# Patient Record
Sex: Female | Born: 1970 | Race: Black or African American | Hispanic: No | Marital: Married | Smoking: Never smoker
Health system: Southern US, Community
[De-identification: ages and names within clinical notes are randomized; demographics above are authoritative.]

## PROBLEM LIST (undated history)

## (undated) DIAGNOSIS — K219 Gastro-esophageal reflux disease without esophagitis: Secondary | ICD-10-CM

## (undated) DIAGNOSIS — K635 Polyp of colon: Secondary | ICD-10-CM

## (undated) DIAGNOSIS — E785 Hyperlipidemia, unspecified: Secondary | ICD-10-CM

## (undated) DIAGNOSIS — E7800 Pure hypercholesterolemia, unspecified: Secondary | ICD-10-CM

## (undated) DIAGNOSIS — F419 Anxiety disorder, unspecified: Secondary | ICD-10-CM

## (undated) DIAGNOSIS — G43909 Migraine, unspecified, not intractable, without status migrainosus: Secondary | ICD-10-CM

## (undated) DIAGNOSIS — I10 Essential (primary) hypertension: Secondary | ICD-10-CM

## (undated) HISTORY — DX: Essential (primary) hypertension: I10

## (undated) HISTORY — DX: Migraine, unspecified, not intractable, without status migrainosus: G43.909

## (undated) HISTORY — DX: Hyperlipidemia, unspecified: E78.5

## (undated) HISTORY — DX: Gastro-esophageal reflux disease without esophagitis: K21.9

## (undated) HISTORY — PX: ABDOMINAL HYSTERECTOMY: SHX81

## (undated) HISTORY — DX: Anxiety disorder, unspecified: F41.9

## (undated) HISTORY — DX: Pure hypercholesterolemia, unspecified: E78.00

## (undated) HISTORY — DX: Polyp of colon: K63.5

---

## 1997-09-23 HISTORY — PX: KNEE SURGERY: SHX244

## 2006-08-01 ENCOUNTER — Ambulatory Visit: Payer: Self-pay | Admitting: Internal Medicine

## 2007-02-03 ENCOUNTER — Ambulatory Visit (HOSPITAL_COMMUNITY): Admission: RE | Admit: 2007-02-03 | Discharge: 2007-02-03 | Payer: Self-pay | Admitting: Specialist

## 2008-03-15 ENCOUNTER — Ambulatory Visit: Payer: Self-pay | Admitting: Gastroenterology

## 2008-08-16 ENCOUNTER — Ambulatory Visit: Payer: Self-pay | Admitting: Internal Medicine

## 2008-09-23 DIAGNOSIS — K635 Polyp of colon: Secondary | ICD-10-CM

## 2008-09-23 HISTORY — PX: COLONOSCOPY: SHX174

## 2008-09-23 HISTORY — DX: Polyp of colon: K63.5

## 2008-10-17 ENCOUNTER — Emergency Department: Payer: Self-pay | Admitting: Emergency Medicine

## 2009-06-20 ENCOUNTER — Emergency Department: Payer: Self-pay | Admitting: Emergency Medicine

## 2009-11-29 ENCOUNTER — Emergency Department: Payer: Self-pay | Admitting: Emergency Medicine

## 2011-01-29 ENCOUNTER — Ambulatory Visit: Payer: Self-pay | Admitting: Internal Medicine

## 2011-06-28 ENCOUNTER — Emergency Department: Payer: Self-pay | Admitting: Internal Medicine

## 2011-08-25 ENCOUNTER — Ambulatory Visit: Payer: Self-pay | Admitting: Sports Medicine

## 2011-12-09 ENCOUNTER — Ambulatory Visit: Payer: Self-pay

## 2012-01-16 ENCOUNTER — Ambulatory Visit: Payer: Self-pay

## 2012-05-29 ENCOUNTER — Ambulatory Visit: Payer: Self-pay | Admitting: Internal Medicine

## 2012-05-29 LAB — URINALYSIS, COMPLETE
Bilirubin,UR: NEGATIVE
Blood: NEGATIVE
Glucose,UR: NEGATIVE mg/dL (ref 0–75)
Ketone: NEGATIVE
Leukocyte Esterase: NEGATIVE
Nitrite: NEGATIVE
Ph: 6 (ref 4.5–8.0)
Protein: NEGATIVE
Specific Gravity: 1.02 (ref 1.003–1.030)

## 2012-05-29 LAB — PREGNANCY, URINE: Pregnancy Test, Urine: NEGATIVE m[IU]/mL

## 2012-05-30 LAB — URINE CULTURE

## 2013-02-02 ENCOUNTER — Ambulatory Visit: Payer: Self-pay

## 2013-12-16 ENCOUNTER — Emergency Department: Payer: Self-pay | Admitting: Emergency Medicine

## 2013-12-16 LAB — COMPREHENSIVE METABOLIC PANEL
Albumin: 3.4 g/dL (ref 3.4–5.0)
Alkaline Phosphatase: 99 U/L
Anion Gap: 5 — ABNORMAL LOW (ref 7–16)
BUN: 11 mg/dL (ref 7–18)
Bilirubin,Total: 0.2 mg/dL (ref 0.2–1.0)
Calcium, Total: 8.5 mg/dL (ref 8.5–10.1)
Chloride: 99 mmol/L (ref 98–107)
Co2: 31 mmol/L (ref 21–32)
Creatinine: 0.76 mg/dL (ref 0.60–1.30)
EGFR (African American): >60
EGFR (Non-African Amer.): >60
Glucose: 99 mg/dL (ref 65–99)
Osmolality: 270 (ref 275–301)
Potassium: 2.9 mmol/L — ABNORMAL LOW (ref 3.5–5.1)
SGOT(AST): 28 U/L (ref 15–37)
SGPT (ALT): 29 U/L (ref 12–78)
Sodium: 135 mmol/L — ABNORMAL LOW (ref 136–145)
Total Protein: 7.4 g/dL (ref 6.4–8.2)

## 2013-12-16 LAB — CBC WITH DIFFERENTIAL/PLATELET
Basophil #: 0.1 10*3/uL (ref 0.0–0.1)
Basophil %: 0.6 %
Eosinophil #: 0 10*3/uL (ref 0.0–0.7)
Eosinophil %: 0.3 %
HCT: 34.6 % — ABNORMAL LOW (ref 35.0–47.0)
HGB: 11.6 g/dL — ABNORMAL LOW (ref 12.0–16.0)
Lymphocyte #: 1.3 10*3/uL (ref 1.0–3.6)
Lymphocyte %: 11.9 %
MCH: 28.5 pg (ref 26.0–34.0)
MCHC: 33.6 g/dL (ref 32.0–36.0)
MCV: 85 fL (ref 80–100)
Monocyte #: 0.5 x10 3/mm (ref 0.2–0.9)
Monocyte %: 4 %
Neutrophil #: 9.4 10*3/uL — ABNORMAL HIGH (ref 1.4–6.5)
Neutrophil %: 83.2 %
Platelet: 344 10*3/uL (ref 150–440)
RBC: 4.07 10*6/uL (ref 3.80–5.20)
RDW: 14.2 % (ref 11.5–14.5)
WBC: 11.3 10*3/uL — ABNORMAL HIGH (ref 3.6–11.0)

## 2013-12-16 LAB — LIPASE, BLOOD: Lipase: 98 U/L (ref 73–393)

## 2013-12-16 LAB — URINALYSIS, COMPLETE
Bacteria: NONE SEEN
Bilirubin,UR: NEGATIVE
Glucose,UR: NEGATIVE mg/dL (ref 0–75)
Ketone: NEGATIVE
Nitrite: NEGATIVE
Ph: 6 (ref 4.5–8.0)
Protein: NEGATIVE
RBC,UR: 2 /HPF (ref 0–5)
Specific Gravity: 1.017 (ref 1.003–1.030)
Squamous Epithelial: 1
WBC UR: <1 /HPF (ref 0–5)

## 2013-12-16 LAB — TROPONIN I: Troponin-I: <0.02 ng/mL

## 2013-12-29 ENCOUNTER — Ambulatory Visit: Payer: Self-pay | Admitting: Internal Medicine

## 2013-12-29 LAB — CREATININE, SERUM
Creatinine: 0.81 mg/dL (ref 0.60–1.30)
EGFR (African American): >60
EGFR (Non-African Amer.): >60

## 2014-01-05 ENCOUNTER — Ambulatory Visit (INDEPENDENT_AMBULATORY_CARE_PROVIDER_SITE_OTHER): Payer: BC Managed Care – PPO | Admitting: General Surgery

## 2014-01-05 ENCOUNTER — Encounter: Payer: Self-pay | Admitting: General Surgery

## 2014-01-05 VITALS — BP 100/78 | HR 80 | Resp 12 | Ht <= 58 in | Wt 153.0 lb

## 2014-01-05 DIAGNOSIS — K824 Cholesterolosis of gallbladder: Secondary | ICD-10-CM

## 2014-01-05 NOTE — Progress Notes (Signed)
Patient ID: Joanne Maddox, female   DOB: [birth year], 43 y.o.   MRN: [id number]  Chief Complaint  Patient presents with  . Other    gall bladder polyp    HPI Jason Hauge is a 43 y.o. female.  Here today for evaluation of gall bladder polyp.  States she has had abdominal pain and diarrhea for about 2 weeks in [facility].  She states her bowels are normal now and move daily. The abdominal pain was upper mid abdomen that lasted all day, started out sharpe then went to a dull ache.  The pain occurred about 2-3 times a week. She feels like she is back to her "normal". She does admit to having a history of being constipated in the past.   Ultrasound done at Dr Humphrey Rolls office on 12-23-13.  HPI  Past Medical History  Diagnosis Date  . Hypertension   . Hyperlipidemia   . Colon polyp 2010  . GERD (gastroesophageal reflux disease)     Past Surgical History  Procedure Laterality Date  . Cesarean section  1989  . Knee surgery Right 1999  . Colonoscopy  2010    Dr Vira Agar    History reviewed. No pertinent family history.  Social History History  Substance Use Topics  . Smoking status: Never Smoker   . Smokeless tobacco: Never Used  . Alcohol Use: Yes     Comment: occasionally    Allergies  Allergen Reactions  . Bactrim [Sulfamethoxazole-Tmp Ds] Hives    Current Outpatient Prescriptions  Medication Sig Dispense Refill  . ALPRAZolam (XANAX) 0.25 MG tablet Take 0.25 mg by mouth at bedtime as needed.       Marland Kitchen losartan-hydrochlorothiazide (HYZAAR) 100-12.5 MG per tablet Take 1 tablet by mouth daily.       Marland Kitchen lovastatin (MEVACOR) 40 MG tablet Take 40 mg by mouth at bedtime.       Marland Kitchen omeprazole (PRILOSEC) 40 MG capsule Take 40 mg by mouth daily.       . SUMAtriptan (IMITREX) 50 MG tablet Take 50 mg by mouth every 2 (two) hours as needed.       . traMADol (ULTRAM) 50 MG tablet Take 50 mg by mouth every 6 (six) hours as needed.        No current facility-administered medications for this visit.     Review of Systems Review of Systems  Constitutional: Negative.   Respiratory: Negative.   Cardiovascular: Negative.   Gastrointestinal: Positive for abdominal pain and diarrhea.    Blood pressure 100/78, pulse 80, resp. rate 12, height 4\' 9"  (1.448 m), weight 153 lb (69.4 kg), last menstrual period 12/12/2013.  Physical Exam Physical Exam  Constitutional: She is oriented to person, place, and time. She appears well-developed and well-nourished.  Eyes: Conjunctivae are normal. No scleral icterus.  Neck: Neck supple.  Cardiovascular: Normal rate, regular rhythm and normal heart sounds.   Pulmonary/Chest: Effort normal and breath sounds normal.  Abdominal: Soft. Normal appearance and bowel sounds are normal. There is no tenderness.  Lymphadenopathy:    She has no cervical adenopathy.  Neurological: She is alert and oriented to person, place, and time.  Skin: Skin is warm and dry.    Data Reviewed US abdomen.  Assessment    Gallbladder polyp. It is very small and likely not cause of her recent symptoms which have now resolved,.    Plan    F/U prn. Gallbladder polyp can be followed with serial yearly [place]. Discussed fully with pt.  Seeplaputhur G Sankar 01/07/2014, 10:16 AM

## 2014-01-05 NOTE — Patient Instructions (Signed)
The patient is aware to call back for any questions or concerns.  

## 2014-01-07 ENCOUNTER — Encounter: Payer: Self-pay | Admitting: General Surgery

## 2014-01-07 DIAGNOSIS — K824 Cholesterolosis of gallbladder: Secondary | ICD-10-CM | POA: Insufficient documentation

## 2014-02-08 ENCOUNTER — Ambulatory Visit: Payer: Self-pay | Admitting: Family Medicine

## 2014-02-08 LAB — URINALYSIS, COMPLETE
Bacteria: NEGATIVE
Bilirubin,UR: NEGATIVE
Blood: NEGATIVE
Glucose,UR: NEGATIVE mg/dL (ref 0–75)
Ketone: NEGATIVE
Leukocyte Esterase: NEGATIVE
Nitrite: NEGATIVE
Ph: 6 (ref 4.5–8.0)
Protein: NEGATIVE
RBC,UR: NONE SEEN /HPF (ref 0–5)
Specific Gravity: 1.01 (ref 1.003–1.030)

## 2014-02-08 LAB — WET PREP, GENITAL

## 2014-02-08 LAB — PREGNANCY, URINE: Pregnancy Test, Urine: NEGATIVE m[IU]/mL

## 2014-02-10 LAB — URINE CULTURE

## 2014-07-25 ENCOUNTER — Encounter: Payer: Self-pay | Admitting: General Surgery

## 2014-08-16 ENCOUNTER — Ambulatory Visit: Payer: Self-pay | Admitting: Internal Medicine

## 2015-07-31 LAB — HM PAP SMEAR: HM Pap smear: NEGATIVE

## 2015-09-11 ENCOUNTER — Other Ambulatory Visit: Payer: Self-pay | Admitting: Nurse Practitioner

## 2015-09-11 DIAGNOSIS — Z1231 Encounter for screening mammogram for malignant neoplasm of breast: Secondary | ICD-10-CM

## 2015-09-20 ENCOUNTER — Ambulatory Visit
Admission: RE | Admit: 2015-09-20 | Discharge: 2015-09-20 | Disposition: A | Discharge status: Home / Self Care | Payer: BLUE CROSS/BLUE SHIELD | Source: Ambulatory Visit | Attending: Nurse Practitioner | Admitting: Nurse Practitioner

## 2015-09-20 DIAGNOSIS — Z1231 Encounter for screening mammogram for malignant neoplasm of breast: Secondary | ICD-10-CM | POA: Diagnosis present

## 2015-11-15 ENCOUNTER — Other Ambulatory Visit (HOSPITAL_COMMUNITY): Payer: Self-pay | Admitting: Nurse Practitioner

## 2015-11-15 ENCOUNTER — Other Ambulatory Visit: Payer: Self-pay | Admitting: Nurse Practitioner

## 2015-11-15 DIAGNOSIS — R079 Chest pain, unspecified: Secondary | ICD-10-CM

## 2015-11-22 ENCOUNTER — Ambulatory Visit: Payer: BLUE CROSS/BLUE SHIELD

## 2016-03-22 DIAGNOSIS — F411 Generalized anxiety disorder: Secondary | ICD-10-CM | POA: Diagnosis not present

## 2016-03-22 DIAGNOSIS — K5909 Other constipation: Secondary | ICD-10-CM | POA: Diagnosis not present

## 2016-03-22 DIAGNOSIS — I10 Essential (primary) hypertension: Secondary | ICD-10-CM | POA: Diagnosis not present

## 2016-03-22 DIAGNOSIS — G43109 Migraine with aura, not intractable, without status migrainosus: Secondary | ICD-10-CM | POA: Diagnosis not present

## 2016-06-27 DIAGNOSIS — Z883 Allergy status to other anti-infective agents status: Secondary | ICD-10-CM | POA: Diagnosis not present

## 2016-06-27 DIAGNOSIS — I10 Essential (primary) hypertension: Secondary | ICD-10-CM | POA: Diagnosis not present

## 2016-06-27 DIAGNOSIS — M549 Dorsalgia, unspecified: Secondary | ICD-10-CM | POA: Diagnosis not present

## 2016-06-27 DIAGNOSIS — R0789 Other chest pain: Secondary | ICD-10-CM | POA: Diagnosis not present

## 2016-06-27 DIAGNOSIS — R0781 Pleurodynia: Secondary | ICD-10-CM | POA: Diagnosis not present

## 2016-07-22 DIAGNOSIS — K5909 Other constipation: Secondary | ICD-10-CM | POA: Diagnosis not present

## 2016-07-22 DIAGNOSIS — I10 Essential (primary) hypertension: Secondary | ICD-10-CM | POA: Diagnosis not present

## 2016-07-22 DIAGNOSIS — M545 Low back pain: Secondary | ICD-10-CM | POA: Diagnosis not present

## 2016-07-22 DIAGNOSIS — M549 Dorsalgia, unspecified: Secondary | ICD-10-CM | POA: Diagnosis not present

## 2016-11-06 DIAGNOSIS — G43109 Migraine with aura, not intractable, without status migrainosus: Secondary | ICD-10-CM | POA: Diagnosis not present

## 2016-11-06 DIAGNOSIS — N921 Excessive and frequent menstruation with irregular cycle: Secondary | ICD-10-CM | POA: Diagnosis not present

## 2016-11-06 DIAGNOSIS — Z124 Encounter for screening for malignant neoplasm of cervix: Secondary | ICD-10-CM | POA: Diagnosis not present

## 2016-11-06 DIAGNOSIS — I10 Essential (primary) hypertension: Secondary | ICD-10-CM | POA: Diagnosis not present

## 2016-11-06 DIAGNOSIS — Z0001 Encounter for general adult medical examination with abnormal findings: Secondary | ICD-10-CM | POA: Diagnosis not present

## 2016-11-08 DIAGNOSIS — E782 Mixed hyperlipidemia: Secondary | ICD-10-CM | POA: Diagnosis not present

## 2016-11-08 DIAGNOSIS — E559 Vitamin D deficiency, unspecified: Secondary | ICD-10-CM | POA: Diagnosis not present

## 2016-11-08 DIAGNOSIS — Z0001 Encounter for general adult medical examination with abnormal findings: Secondary | ICD-10-CM | POA: Diagnosis not present

## 2016-11-08 DIAGNOSIS — I10 Essential (primary) hypertension: Secondary | ICD-10-CM | POA: Diagnosis not present

## 2016-11-08 DIAGNOSIS — N921 Excessive and frequent menstruation with irregular cycle: Secondary | ICD-10-CM | POA: Diagnosis not present

## 2016-11-13 DIAGNOSIS — N921 Excessive and frequent menstruation with irregular cycle: Secondary | ICD-10-CM | POA: Diagnosis not present

## 2016-12-27 DIAGNOSIS — D259 Leiomyoma of uterus, unspecified: Secondary | ICD-10-CM | POA: Diagnosis not present

## 2016-12-27 DIAGNOSIS — G43109 Migraine with aura, not intractable, without status migrainosus: Secondary | ICD-10-CM | POA: Diagnosis not present

## 2016-12-27 DIAGNOSIS — N921 Excessive and frequent menstruation with irregular cycle: Secondary | ICD-10-CM | POA: Diagnosis not present

## 2016-12-27 DIAGNOSIS — E782 Mixed hyperlipidemia: Secondary | ICD-10-CM | POA: Diagnosis not present

## 2016-12-31 ENCOUNTER — Telehealth: Payer: Self-pay | Admitting: Obstetrics and Gynecology

## 2016-12-31 NOTE — Telephone Encounter (Signed)
LV M for Pt to call back to be schedule for Referral From [facility]  Dr. Clayborn Bigness. For large 8 cm Fibroid Tummor with enlargement uterus. Please schedule when pt calls back.

## 2016-12-31 NOTE — Telephone Encounter (Signed)
This was in Clinical basket, we can't schedule patients

## 2017-01-01 NOTE — Telephone Encounter (Signed)
Left voicemail to have pt to call back to be schedule 2x

## 2017-01-02 NOTE — Telephone Encounter (Signed)
LVm to have pt call back to schedule appt 3x. Will Send out letter

## 2017-01-02 NOTE — Telephone Encounter (Signed)
Pt is schedule 01/23/17 for AMS

## 2017-01-23 ENCOUNTER — Ambulatory Visit (INDEPENDENT_AMBULATORY_CARE_PROVIDER_SITE_OTHER): Payer: Self-pay | Admitting: Obstetrics and Gynecology

## 2017-01-23 ENCOUNTER — Encounter: Payer: Self-pay | Admitting: Obstetrics and Gynecology

## 2017-01-23 VITALS — BP 112/72 | HR 64 | Ht <= 58 in | Wt 154.0 lb

## 2017-01-23 DIAGNOSIS — D259 Leiomyoma of uterus, unspecified: Secondary | ICD-10-CM | POA: Diagnosis not present

## 2017-01-23 DIAGNOSIS — N920 Excessive and frequent menstruation with regular cycle: Secondary | ICD-10-CM

## 2017-01-23 NOTE — Progress Notes (Signed)
Gynecology Abnormal Uterine Bleeding Initial Evaluation   Chief Complaint:  Chief Complaint  Patient presents with  . Fibroids    Referred by PCP Dr. Chancy Milroy    History of Present Illness:    Paitient is a 46 y.o. G1P1 who LMP was Patient's last menstrual period was 12/30/2016 (approximate)., presents today for a problem visit.  She has been experiencing increasingly heavy but regular menstrual flow over the past year.  Evaluation by her PCP revealed an 8cm uterine fibroid, records of imaging are not available for review at the time of the consultation.  The only prior uterine imaging I have available for review is [facility] conducted 02/03/2008 which showed patent tube and at that time normal uterine contour.  Currently menses last up to 7 days, she does report some dysmenorrhea and passage of clots.  She herself does not relay a prior history of fibroids preceding this utlrasound  Previous evaluation: ultrasound showing reported 8cm uterine fibroid. Prior Diagnosis: uterine fibroids. Previous Treatment: none.    Review of Systems: Review of Systems  Cardiovascular: Negative for chest pain.  Gastrointestinal: Positive for constipation. Negative for abdominal pain and diarrhea.  Genitourinary: Positive for frequency. Negative for dysuria, flank pain, hematuria and urgency.    Past Medical History:  Past Medical History:  Diagnosis Date  . Anxiety   . Colon polyp 2010  . GERD (gastroesophageal reflux disease)   . Hypercholesteremia   . Hyperlipidemia   . Hypertension   . Migraine     Past Surgical History:  Past Surgical History:  Procedure Laterality Date  . CESAREAN SECTION  1989  . COLONOSCOPY  2010   Dr Vira Agar  . KNEE SURGERY Right 1999    Obstetric History: G1P1  Family History:  History reviewed. No pertinent family history.  Social History:  Social History   Social History  . Marital status: Married    Spouse name: N/A  . Number of children: N/A  . Years of  education: N/A   Occupational History  . Not on file.   Social History Main Topics  . Smoking status: Never Smoker  . Smokeless tobacco: Never Used  . Alcohol use Yes     Comment: occasionally  . Drug use: No  . Sexual activity: Yes   Other Topics Concern  . Not on file   Social History Narrative  . No narrative on file    Allergies:  Allergies  Allergen Reactions  . Bactrim [Sulfamethoxazole-Trimethoprim] Hives    Medications: Prior to Admission medications   Medication Sig Start Date End Date Taking? Authorizing Provider  ALPRAZolam (XANAX) 0.25 MG tablet Take 0.25 mg by mouth at bedtime as needed.  11/04/13   Historical Provider, MD  losartan-hydrochlorothiazide (HYZAAR) 100-12.5 MG per tablet Take 1 tablet by mouth daily.  12/07/13   Historical Provider, MD  lovastatin (MEVACOR) 40 MG tablet Take 40 mg by mouth at bedtime.  11/03/13   Historical Provider, MD  omeprazole (PRILOSEC) 40 MG capsule Take 40 mg by mouth daily.  12/09/13   Historical Provider, MD  SUMAtriptan (IMITREX) 50 MG tablet Take 50 mg by mouth every 2 (two) hours as needed.  12/15/13   Historical Provider, MD  traMADol (ULTRAM) 50 MG tablet Take 50 mg by mouth every 6 (six) hours as needed.  12/11/13   Historical Provider, MD    Physical Exam Vitals:  Vitals:   01/23/17 1011  BP: 112/72  Pulse: 64   Patient's last menstrual period was  12/30/2016 (approximate).  General: NAD HEENT: normocephalic, anicteric Pulmonary: No increased work of breathing Abdomen: NABS, soft, non-tender, non-distended.  Umbilicus without lesions.  No hepatomegaly, splenomegaly enlarged uterus or other mass extending to approximately just below the umbilicus and more prominent on patient left. No evidence of hernia  Extremities: no edema, erythema, or tenderness Neurologic: Grossly intact Psychiatric: mood appropriate, affect full  Female chaperone present for pelvic portions of the physical exam  Assessment: 46 y.o. G1P1  with menorrhagia likely secondary to uterine fibroids  Plan: Problem List Items Addressed This Visit    None    Visit Diagnoses    Uterine leiomyoma, unspecified location    -  Primary   Relevant Orders   US Transvaginal Non-OB   Menorrhagia with regular cycle           1) Discussed management options for uterine fibroids including expectant management, attempts at hormonal management (progestins including IUD, depo leupron), uterine artery embolization, high frequency ultrasound ablation, and hysterectomy.  Discussed risks and benefits of each method.   Final management decision will hinge on results of patient's work up and follow up ultrasound to document stable size, location of fibroid   Printed patient education handouts were given to the patient to review at home.  Bleeding precautions reviewed.   2) The patient presents some surgical risk and will likely need cardiac clearance prior to proceeding to OR if she choses hysterectomy as her management option.  Trial of leupron prior to surgery may also be considered given size of fibroid.  Will need preoperative endometrial biopsy.  Pap up to date NIL/neg 07/31/2015  3) A total of 15 minutes were spent in face-to-face contact with the patient during this encounter with over half of that time devoted to counseling and coordination of care.

## 2017-02-18 ENCOUNTER — Ambulatory Visit (INDEPENDENT_AMBULATORY_CARE_PROVIDER_SITE_OTHER): Payer: BLUE CROSS/BLUE SHIELD | Admitting: Obstetrics and Gynecology

## 2017-02-18 ENCOUNTER — Encounter: Payer: Self-pay | Admitting: Obstetrics and Gynecology

## 2017-02-18 ENCOUNTER — Ambulatory Visit (INDEPENDENT_AMBULATORY_CARE_PROVIDER_SITE_OTHER): Payer: BLUE CROSS/BLUE SHIELD

## 2017-02-18 VITALS — BP 102/62 | HR 85 | Wt 155.0 lb

## 2017-02-18 DIAGNOSIS — D259 Leiomyoma of uterus, unspecified: Secondary | ICD-10-CM | POA: Diagnosis not present

## 2017-02-18 DIAGNOSIS — D251 Intramural leiomyoma of uterus: Secondary | ICD-10-CM

## 2017-02-18 MED ORDER — MEDROXYPROGESTERONE ACETATE 10 MG PO TABS: 10.0000 mg | ORAL_TABLET | Freq: Every day | ORAL | 11 refills | Status: DC

## 2017-02-18 NOTE — Patient Instructions (Signed)
Levonorgestrel intrauterine device (IUD) What is this medicine? LEVONORGESTREL IUD (LEE voe nor jes trel) is a contraceptive (birth control) device. The device is placed inside the uterus by a healthcare professional. It is used to prevent pregnancy. This device can also be used to treat heavy bleeding that occurs during your period. This medicine may be used for other purposes; ask your health care provider or pharmacist if you have questions. COMMON BRAND NAME(S): Kyleena, LILETTA, Mirena, Skyla What should I tell my health care provider before I take this medicine? They need to know if you have any of these conditions: -abnormal Pap smear -cancer of the breast, uterus, or cervix -diabetes -endometritis -genital or pelvic infection now or in the past -have more than one sexual partner or your partner has more than one partner -heart disease -history of an ectopic or tubal pregnancy -immune system problems -IUD in place -liver disease or tumor -problems with blood clots or take blood-thinners -seizures -use intravenous drugs -uterus of unusual shape -vaginal bleeding that has not been explained -an unusual or allergic reaction to levonorgestrel, other hormones, silicone, or polyethylene, medicines, foods, dyes, or preservatives -pregnant or trying to get pregnant -breast-feeding How should I use this medicine? This device is placed inside the uterus by a health care professional. Talk to your pediatrician regarding the use of this medicine in children. Special care may be needed. Overdosage: If you think you have taken too much of this medicine contact a poison control center or emergency room at once. NOTE: This medicine is only for you. Do not share this medicine with others. What if I miss a dose? This does not apply. Depending on the brand of device you have inserted, the device will need to be replaced every 3 to 5 years if you wish to continue using this type of birth  control. What may interact with this medicine? Do not take this medicine with any of the following medications: -amprenavir -bosentan -fosamprenavir This medicine may also interact with the following medications: -aprepitant -armodafinil -barbiturate medicines for inducing sleep or treating seizures -bexarotene -boceprevir -griseofulvin -medicines to treat seizures like carbamazepine, ethotoin, felbamate, oxcarbazepine, phenytoin, topiramate -modafinil -pioglitazone -rifabutin -rifampin -rifapentine -some medicines to treat HIV infection like atazanavir, efavirenz, indinavir, lopinavir, nelfinavir, tipranavir, ritonavir -St. John's wort -warfarin This list may not describe all possible interactions. Give your health care provider a list of all the medicines, herbs, non-prescription drugs, or dietary supplements you use. Also tell them if you smoke, drink alcohol, or use illegal drugs. Some items may interact with your medicine. What should I watch for while using this medicine? Visit your doctor or health care professional for regular check ups. See your doctor if you or your partner has sexual contact with others, becomes HIV positive, or gets a sexual transmitted disease. This product does not protect you against HIV infection (AIDS) or other sexually transmitted diseases. You can check the placement of the IUD yourself by reaching up to the top of your vagina with clean fingers to feel the threads. Do not pull on the threads. It is a good habit to check placement after each menstrual period. Call your doctor right away if you feel more of the IUD than just the threads or if you cannot feel the threads at all. The IUD may come out by itself. You may become pregnant if the device comes out. If you notice that the IUD has come out use a backup birth control method like condoms and call your   health care provider. Using tampons will not change the position of the IUD and are okay to use  during your period. This IUD can be safely scanned with magnetic resonance imaging (MRI) only under specific conditions. Before you have an MRI, tell your healthcare provider that you have an IUD in place, and which type of IUD you have in place. What side effects may I notice from receiving this medicine? Side effects that you should report to your doctor or health care professional as soon as possible: -allergic reactions like skin rash, itching or hives, swelling of the face, lips, or tongue -fever, flu-like symptoms -genital sores -high blood pressure -no menstrual period for 6 weeks during use -pain, swelling, warmth in the leg -pelvic pain or tenderness -severe or sudden headache -signs of pregnancy -stomach cramping -sudden shortness of breath -trouble with balance, talking, or walking -unusual vaginal bleeding, discharge -yellowing of the eyes or skin Side effects that usually do not require medical attention (report to your doctor or health care professional if they continue or are bothersome): -acne -breast pain -change in sex drive or performance -changes in weight -cramping, dizziness, or faintness while the device is being inserted -headache -irregular menstrual bleeding within first 3 to 6 months of use -nausea This list may not describe all possible side effects. Call your doctor for medical advice about side effects. You may report side effects to FDA at 1-800-FDA-1088. Where should I keep my medicine? This does not apply. NOTE: This sheet is a summary. It may not cover all possible information. If you have questions about this medicine, talk to your doctor, pharmacist, or health care provider.  2018 Elsevier/Gold Standard (2016-06-21 14:14:56) Uterine Artery Embolization for Fibroids Uterine artery embolization is a nonsurgical treatment to shrink fibroids. A thin plastic tube (catheter) is used to inject material that blocks off the blood supply to the fibroid,  which causes the fibroid to shrink. Tell a health care provider about:  Any allergies you have.  All medicines you are taking, including vitamins, herbs, eye drops, creams, and over-the-counter medicines.  Any problems you or family members have had with anesthetic medicines.  Any blood disorders you have.  Any surgeries you have had.  Any medical conditions you have. What are the risks?  Injury to the uterus from decreased blood supply  Infection.  Blood infection (septicemia).  Lack of menstrual periods (amenorrhea).  Death of tissue cells (necrosis) around your bladder or vulva.  Development of a hole between organs or from an organ to the surface of your skin (fistula).  Blood clot in the legs (deep vein thrombosis) or lung (pulmonary embolus). What happens before the procedure?  Ask your health care provider about changing or stopping your regular medicines.  Do not take aspirin or blood thinners (anticoagulants) for 1 week before the surgery or as directed by your health care provider.  Do not eat or drink anything for 8 hours before the surgery or as directed by your health care provider.  Empty your bladder before the procedure begins. What happens during the procedure?  An IV tube will be placed into one of your veins. This will be used to give you a sedative and pain medication (conscious sedation).  You will be given a medicine that numbs the area (local anesthetic).  A small cut will be made in your groin. A catheter is then inserted into the main artery of your leg.  The catheter will be guided through the artery to your  uterus. A series of images will be taken while dye is injected through the catheter in your groin. X-rays are taken at the same time. This is done to provide a road map of the blood supply to your uterus and fibroids.  Tiny plastic spheres, about the size of sand grains, will be injected through the catheter. Metal coils may be used to  help block the artery. The particles will lodge in tiny branches of the uterine artery that supplies blood to the fibroids.  The procedure is repeated on the artery that supplies the other side of the uterus.  The catheter is then removed and pressure is held to stop any bleeding. No stitches are needed.  A dressing is then placed over the cut (incision). What happens after the procedure?  You will be taken to a recovery area where your progress will be monitored until you are awake, stable, and taking fluids well. If there are no other problems, you will then be moved to a regular hospital room.  You will be observed overnight in the hospital.  You will have cramping that should be controlled with pain medication. This information is not intended to replace advice given to you by your health care provider. Make sure you discuss any questions you have with your health care provider. Document Released: 11/25/2005 Document Revised: 02/15/2016 Document Reviewed: 03/25/2013 Elsevier Interactive Patient Education  2017 [employer].

## 2017-02-18 NOTE — Progress Notes (Signed)
    Gynecology Ultrasound Follow Up  Chief Complaint:  Chief Complaint  Patient presents with  . GYN U/S follow up     History of Present Illness: Patient is a 46 y.o. female who presents today for ultrasound evaluation of menorrhagia and uterine fibroid.  Ultrasound demonstrates the following findgins Adnexa: normal appearing adenxa Uterus: 5cm intramural posterior fundal fibroid with endometrial stripe  8.2cm Additional: no free fluid  Review of Systems: Review of Systems  Constitutional: Negative for chills and fever.  Gastrointestinal: Negative for abdominal pain.  Review of systems negative unless otherwise noted in HPI  Past Medical History:  Past Medical History:  Diagnosis Date  . Anxiety   . Colon polyp 2010  . GERD (gastroesophageal reflux disease)   . Hypercholesteremia   . Hyperlipidemia   . Hypertension   . Migraine     Past Surgical History:  Past Surgical History:  Procedure Laterality Date  . CESAREAN SECTION  1989  . COLONOSCOPY  2010   Dr Vira Agar  . KNEE SURGERY Right 1999    Gynecologic History:  Patient's last menstrual period was 01/26/2017 (exact date).  Family History:  History reviewed. No pertinent family history.  Social History:  Social History   Social History  . Marital status: Married    Spouse name: N/A  . Number of children: N/A  . Years of education: N/A   Occupational History  . Not on file.   Social History Main Topics  . Smoking status: Never Smoker  . Smokeless tobacco: Never Used  . Alcohol use Yes     Comment: occasionally  . Drug use: No  . Sexual activity: Yes   Other Topics Concern  . Not on file   Social History Narrative  . No narrative on file    Allergies:  Allergies  Allergen Reactions  . Bactrim [Sulfamethoxazole-Trimethoprim] Hives    Medications: Prior to Admission medications   Medication Sig Start Date End Date Taking? Authorizing Provider  ALPRAZolam (XANAX) 0.25 MG tablet Take  0.25 mg by mouth at bedtime as needed.  11/04/13  Yes [provider]  losartan-hydrochlorothiazide (HYZAAR) 100-12.5 MG per tablet Take 1 tablet by mouth daily.  12/07/13  Yes [provider]  lovastatin (MEVACOR) 40 MG tablet Take 40 mg by mouth at bedtime.  11/03/13  Yes [provider]  omeprazole (PRILOSEC) 40 MG capsule Take 40 mg by mouth daily.  12/09/13  Yes [provider]  SUMAtriptan (IMITREX) 50 MG tablet Take 50 mg by mouth every 2 (two) hours as needed.  12/15/13  Yes [provider]  traMADol (ULTRAM) 50 MG tablet Take 50 mg by mouth every 6 (six) hours as needed.  12/11/13  Yes [provider]  medroxyPROGESTERone (PROVERA) 10 MG tablet Take 1 tablet (10 mg total) by mouth daily. 02/18/17 02/18/18  Malachy Mood, MD    Physical Exam Vitals: Blood pressure 102/62, pulse 85, weight 155 lb (70.3 kg), last menstrual period 01/26/2017.  General: NAD HEENT: normocephalic, anicteric Pulmonary: No increased work of breathing Extremities: no edema, erythema, or tenderness Neurologic: Grossly intact, normal gait Psychiatric: mood appropriate, affect full   Assessment: 46 y.o. G1P1001 menorrhagia and uterine firboid  Plan: Problem List Items Addressed This Visit    None    Visit Diagnoses    Intramural leiomyoma of uterus    -  Primary     - start provera - given info on uterine artery embolization and mirena

## 2017-03-18 DIAGNOSIS — Z9289 Personal history of other medical treatment: Secondary | ICD-10-CM | POA: Diagnosis not present

## 2017-03-18 DIAGNOSIS — Z1231 Encounter for screening mammogram for malignant neoplasm of breast: Secondary | ICD-10-CM | POA: Diagnosis not present

## 2017-03-20 ENCOUNTER — Telehealth: Payer: Self-pay

## 2017-03-20 NOTE — Telephone Encounter (Signed)
Pt has fibroid tumor and has started spotting.  What's causing it?  Is it normal?  [phone]

## 2017-03-20 NOTE — Telephone Encounter (Signed)
Maybe secondary to the provera, should stop as she continues on provera.  If continued spotting I'd like to see her int he office for an endometrial biopsy

## 2017-03-20 NOTE — Telephone Encounter (Signed)
Please advise 

## 2017-03-20 NOTE — Telephone Encounter (Signed)
Pt is calling back to speak with a nurse. Please call patient

## 2017-03-20 NOTE — Telephone Encounter (Signed)
Pt states she has not started the medication d/t she read all the side effects. Pt advised it doesn't necessarily mean she will have those side effects also pt aware she could consider the other options she and AMS discussed. She states she will try the medication and call AMS back if it doesn't work.

## 2017-03-20 NOTE — Telephone Encounter (Signed)
Left message for patient to call me back. 

## 2017-04-04 DIAGNOSIS — S40269A Insect bite (nonvenomous) of unspecified shoulder, initial encounter: Secondary | ICD-10-CM | POA: Diagnosis not present

## 2017-04-04 DIAGNOSIS — I10 Essential (primary) hypertension: Secondary | ICD-10-CM | POA: Diagnosis not present

## 2017-04-04 DIAGNOSIS — F419 Anxiety disorder, unspecified: Secondary | ICD-10-CM | POA: Diagnosis not present

## 2017-04-04 DIAGNOSIS — K219 Gastro-esophageal reflux disease without esophagitis: Secondary | ICD-10-CM | POA: Diagnosis not present

## 2017-06-12 DIAGNOSIS — G43109 Migraine with aura, not intractable, without status migrainosus: Secondary | ICD-10-CM | POA: Diagnosis not present

## 2017-06-12 DIAGNOSIS — J029 Acute pharyngitis, unspecified: Secondary | ICD-10-CM | POA: Diagnosis not present

## 2017-06-12 DIAGNOSIS — F411 Generalized anxiety disorder: Secondary | ICD-10-CM | POA: Diagnosis not present

## 2017-06-12 DIAGNOSIS — I10 Essential (primary) hypertension: Secondary | ICD-10-CM | POA: Diagnosis not present

## 2017-07-04 DIAGNOSIS — W57XXXA Bitten or stung by nonvenomous insect and other nonvenomous arthropods, initial encounter: Secondary | ICD-10-CM | POA: Diagnosis not present

## 2017-07-04 DIAGNOSIS — S1086XA Insect bite of other specified part of neck, initial encounter: Secondary | ICD-10-CM | POA: Diagnosis not present

## 2017-07-04 DIAGNOSIS — I10 Essential (primary) hypertension: Secondary | ICD-10-CM | POA: Diagnosis not present

## 2017-07-04 DIAGNOSIS — S60861A Insect bite (nonvenomous) of right wrist, initial encounter: Secondary | ICD-10-CM | POA: Diagnosis not present

## 2017-07-04 DIAGNOSIS — S0086XA Insect bite (nonvenomous) of other part of head, initial encounter: Secondary | ICD-10-CM | POA: Diagnosis not present

## 2017-07-04 DIAGNOSIS — S40261A Insect bite (nonvenomous) of right shoulder, initial encounter: Secondary | ICD-10-CM | POA: Diagnosis not present

## 2017-07-11 DIAGNOSIS — H6530 Chronic mucoid otitis media, unspecified ear: Secondary | ICD-10-CM | POA: Diagnosis not present

## 2017-07-11 DIAGNOSIS — S40269A Insect bite (nonvenomous) of unspecified shoulder, initial encounter: Secondary | ICD-10-CM | POA: Diagnosis not present

## 2017-07-11 DIAGNOSIS — J019 Acute sinusitis, unspecified: Secondary | ICD-10-CM | POA: Diagnosis not present

## 2017-07-11 DIAGNOSIS — I10 Essential (primary) hypertension: Secondary | ICD-10-CM | POA: Diagnosis not present

## 2017-09-17 ENCOUNTER — Encounter: Payer: Self-pay | Admitting: Nurse Practitioner

## 2017-09-17 ENCOUNTER — Ambulatory Visit: Payer: BLUE CROSS/BLUE SHIELD | Admitting: Nurse Practitioner

## 2017-09-17 DIAGNOSIS — G43119 Migraine with aura, intractable, without status migrainosus: Secondary | ICD-10-CM

## 2017-09-17 DIAGNOSIS — M545 Low back pain, unspecified: Secondary | ICD-10-CM | POA: Insufficient documentation

## 2017-09-17 DIAGNOSIS — M791 Myalgia, unspecified site: Secondary | ICD-10-CM | POA: Insufficient documentation

## 2017-09-17 DIAGNOSIS — R079 Chest pain, unspecified: Secondary | ICD-10-CM | POA: Insufficient documentation

## 2017-09-17 DIAGNOSIS — F411 Generalized anxiety disorder: Secondary | ICD-10-CM

## 2017-09-17 DIAGNOSIS — D259 Leiomyoma of uterus, unspecified: Secondary | ICD-10-CM | POA: Insufficient documentation

## 2017-09-17 DIAGNOSIS — N921 Excessive and frequent menstruation with irregular cycle: Secondary | ICD-10-CM | POA: Insufficient documentation

## 2017-09-17 DIAGNOSIS — R1084 Generalized abdominal pain: Secondary | ICD-10-CM | POA: Insufficient documentation

## 2017-09-17 DIAGNOSIS — M25511 Pain in right shoulder: Secondary | ICD-10-CM | POA: Insufficient documentation

## 2017-09-17 DIAGNOSIS — R42 Dizziness and giddiness: Secondary | ICD-10-CM | POA: Insufficient documentation

## 2017-09-17 DIAGNOSIS — M549 Dorsalgia, unspecified: Secondary | ICD-10-CM

## 2017-09-17 DIAGNOSIS — I10 Essential (primary) hypertension: Secondary | ICD-10-CM | POA: Diagnosis not present

## 2017-09-17 DIAGNOSIS — E559 Vitamin D deficiency, unspecified: Secondary | ICD-10-CM | POA: Insufficient documentation

## 2017-09-17 DIAGNOSIS — E782 Mixed hyperlipidemia: Secondary | ICD-10-CM

## 2017-09-17 DIAGNOSIS — K5909 Other constipation: Secondary | ICD-10-CM | POA: Insufficient documentation

## 2017-09-17 DIAGNOSIS — J019 Acute sinusitis, unspecified: Secondary | ICD-10-CM

## 2017-09-17 DIAGNOSIS — R5382 Chronic fatigue, unspecified: Secondary | ICD-10-CM | POA: Insufficient documentation

## 2017-09-17 DIAGNOSIS — J029 Acute pharyngitis, unspecified: Secondary | ICD-10-CM | POA: Insufficient documentation

## 2017-09-17 DIAGNOSIS — R5383 Other fatigue: Secondary | ICD-10-CM | POA: Insufficient documentation

## 2017-09-17 DIAGNOSIS — K219 Gastro-esophageal reflux disease without esophagitis: Secondary | ICD-10-CM | POA: Diagnosis not present

## 2017-09-17 DIAGNOSIS — H6530 Chronic mucoid otitis media, unspecified ear: Secondary | ICD-10-CM | POA: Insufficient documentation

## 2017-09-17 DIAGNOSIS — R300 Dysuria: Secondary | ICD-10-CM | POA: Insufficient documentation

## 2017-09-17 DIAGNOSIS — M158 Other polyosteoarthritis: Secondary | ICD-10-CM | POA: Insufficient documentation

## 2017-09-17 MED ORDER — CLARITHROMYCIN 500 MG PO TABS: 500.0000 mg | ORAL_TABLET | Freq: Two times a day (BID) | ORAL | 0 refills | Status: DC

## 2017-09-17 MED ORDER — TRAMADOL HCL 50 MG PO TABS: 50.0000 mg | ORAL_TABLET | Freq: Four times a day (QID) | ORAL | 3 refills | Status: DC | PRN

## 2017-09-17 MED ORDER — SUMATRIPTAN SUCCINATE 50 MG PO TABS: 50.0000 mg | ORAL_TABLET | ORAL | 5 refills | Status: DC | PRN

## 2017-09-17 MED ORDER — OMEPRAZOLE 40 MG PO CPDR: 40.0000 mg | DELAYED_RELEASE_CAPSULE | Freq: Every day | ORAL | 5 refills | Status: DC

## 2017-09-17 NOTE — Progress Notes (Signed)
Subjective:     Patient ID: Joanne Maddox, female   DOB: 05-08-1971, 46 y.o.   MRN: [id number]  Patient c/o sinus congestion and frontal sinus headache for past 2 to 3 days. Hurts more when she moves her eys back and forth. No fever. Was treated for similar symptoms in 06/2017. A round of biaxin twice daily for 10 days resolved the symttoms.    Current Outpatient Medications:  .  ALPRAZolam (XANAX) 0.25 MG tablet, Take 0.25 mg by mouth at bedtime as needed. , Disp: , Rfl:  .  losartan-hydrochlorothiazide (HYZAAR) 100-12.5 MG per tablet, Take 1 tablet by mouth daily. , Disp: , Rfl:  .  lubiprostone (AMITIZA) 24 MCG capsule, Take 24 mcg by mouth as needed for constipation., Disp: , Rfl:  .  SUMAtriptan (IMITREX) 50 MG tablet, Take 1 tablet (50 mg total) by mouth every 2 (two) hours as needed., Disp: 10 tablet, Rfl: 5 .  traMADol (ULTRAM) 50 MG tablet, Take 1 tablet (50 mg total) by mouth every 6 (six) hours as needed., Disp: 30 tablet, Rfl: 3 .  clarithromycin (BIAXIN) 500 MG tablet, Take 1 tablet (500 mg total) by mouth 2 (two) times daily., Disp: 20 tablet, Rfl: 0 .  lovastatin (MEVACOR) 40 MG tablet, Take 40 mg by mouth at bedtime. , Disp: , Rfl:  .  medroxyPROGESTERone (PROVERA) 10 MG tablet, Take 1 tablet (10 mg total) by mouth daily. (Patient not taking: Reported on 09/17/2017), Disp: 30 tablet, Rfl: 11 .  omeprazole (PRILOSEC) 40 MG capsule, Take 1 capsule (40 mg total) by mouth daily., Disp: 30 capsule, Rfl: 5   Review of Systems  Constitutional: Positive for chills and fatigue. Negative for fever.  HENT: Positive for congestion, ear pain, postnasal drip, rhinorrhea, sinus pain, sore throat and voice change.   Eyes: Negative.   Respiratory: Positive for cough and wheezing.   Cardiovascular: Negative.   Gastrointestinal: Negative.   Endocrine: Negative.   Musculoskeletal: Negative.   Skin: Negative.   Allergic/Immunologic: Positive for environmental allergies.  Neurological: Positive  for headaches.  Hematological: Negative.   Psychiatric/Behavioral: Negative.    Today's Vitals   09/17/17 1454  BP: 133/78  Pulse: 88  Resp: 16  SpO2: 99%  Weight: 162 lb (73.5 kg)  Height: 4\' 9"  (1.448 m)       Objective:   Physical Exam  Constitutional: She appears well-developed and well-nourished.  Nursing note and vitals reviewed.      Assessment:     Acute sinusitis, recurrence not specified, unspecified location - Plan: clarithromycin (BIAXIN) 500 MG tablet  Essential (primary) hypertension  Dorsalgia, unspecified - Plan: traMADol (ULTRAM) 50 MG tablet  Migraine with aura, intractable, without status migrainosus - Plan: SUMAtriptan (IMITREX) 50 MG tablet  Gastro-esophageal reflux disease without esophagitis - Plan: omeprazole (PRILOSEC) 40 MG capsule  Mixed hyperlipidemia  Generalized anxiety disorder  Low back pain, unspecified back pain laterality, unspecified chronicity, with sciatica presence unspecified     Plan:        1. biaxin 500mg  bid for 10 days. Use OTC meds to relieve symptoms 2. Stable bp - continue bp meds as prescribed  3. Migraines - doing well. Continue abortive therapy as needed and as prescribed  4. Reflux - continue omeprazole as prescribed  5. Hyperlipidemia - continue stat as prescribed  6. GAD - continue alprazolam as needed and as prescribed - new rx provided today 7. Low back pain - may take tramadol as nneeded and as prescribed. New rx  provided today.  Health maintenance exam in 3 months. Follow up sooner if needed.

## 2017-10-21 ENCOUNTER — Other Ambulatory Visit: Payer: Self-pay | Admitting: Nurse Practitioner

## 2017-10-21 DIAGNOSIS — F411 Generalized anxiety disorder: Secondary | ICD-10-CM

## 2017-10-21 MED ORDER — ALPRAZOLAM 0.25 MG PO TABS: 0.2500 mg | ORAL_TABLET | Freq: Every evening | ORAL | 3 refills | Status: DC | PRN

## 2017-11-28 ENCOUNTER — Encounter: Payer: Self-pay | Admitting: Nurse Practitioner

## 2017-11-28 ENCOUNTER — Ambulatory Visit: Payer: BLUE CROSS/BLUE SHIELD | Admitting: Nurse Practitioner

## 2017-11-28 ENCOUNTER — Other Ambulatory Visit: Payer: Self-pay | Admitting: Nurse Practitioner

## 2017-11-28 VITALS — BP 160/84 | HR 84 | Resp 16 | Ht <= 58 in | Wt 163.2 lb

## 2017-11-28 DIAGNOSIS — R300 Dysuria: Secondary | ICD-10-CM | POA: Diagnosis not present

## 2017-11-28 DIAGNOSIS — E782 Mixed hyperlipidemia: Secondary | ICD-10-CM | POA: Diagnosis not present

## 2017-11-28 DIAGNOSIS — E559 Vitamin D deficiency, unspecified: Secondary | ICD-10-CM | POA: Diagnosis not present

## 2017-11-28 DIAGNOSIS — F411 Generalized anxiety disorder: Secondary | ICD-10-CM

## 2017-11-28 DIAGNOSIS — M549 Dorsalgia, unspecified: Secondary | ICD-10-CM | POA: Diagnosis not present

## 2017-11-28 DIAGNOSIS — Z124 Encounter for screening for malignant neoplasm of cervix: Secondary | ICD-10-CM | POA: Diagnosis not present

## 2017-11-28 DIAGNOSIS — Z1239 Encounter for other screening for malignant neoplasm of breast: Secondary | ICD-10-CM

## 2017-11-28 DIAGNOSIS — I10 Essential (primary) hypertension: Secondary | ICD-10-CM | POA: Diagnosis not present

## 2017-11-28 DIAGNOSIS — Z1231 Encounter for screening mammogram for malignant neoplasm of breast: Secondary | ICD-10-CM

## 2017-11-28 DIAGNOSIS — Z0001 Encounter for general adult medical examination with abnormal findings: Secondary | ICD-10-CM | POA: Diagnosis not present

## 2017-11-28 MED ORDER — OLMESARTAN MEDOXOMIL-HCTZ 40-25 MG PO TABS: 1.0000 | ORAL_TABLET | Freq: Every day | ORAL | 3 refills | Status: DC

## 2017-11-28 MED ORDER — ALPRAZOLAM 0.25 MG PO TABS: 0.2500 mg | ORAL_TABLET | Freq: Every evening | ORAL | 3 refills | Status: DC | PRN

## 2017-11-28 MED ORDER — TRAMADOL HCL 50 MG PO TABS: 50.0000 mg | ORAL_TABLET | Freq: Four times a day (QID) | ORAL | 3 refills | Status: DC | PRN

## 2017-11-28 NOTE — Progress Notes (Signed)
[facility] [place], [place] [address]  Internal MEDICINE  Office Visit Note  Patient Name: Joanne Maddox  [id number]  [id number]  Date of Service: 12/17/2017  Chief Complaint  Patient presents with  . Fatigue    in particular, both legs feeling fatigued.      Patient is here for routine health maintenance exam. Today, she complains of fatigue. In particular, she c/o fatigue in both legs, especially when standing up for long periods of time. She hs no back pain or pain in the legs. States they just feel tired.  Blood pressure is elevated today.   Pt is here for routine health maintenance examination  Current Medication: Outpatient Encounter Medications as of 11/28/2017  Medication Sig Note  . ALPRAZolam (XANAX) 0.25 MG tablet Take 1 tablet (0.25 mg total) by mouth at bedtime as needed.   . lovastatin (MEVACOR) 40 MG tablet Take 40 mg by mouth at bedtime.  01/05/2014: Received from: External Pharmacy  . lubiprostone (AMITIZA) 24 MCG capsule Take 24 mcg by mouth as needed for constipation.   Marland Kitchen omeprazole (PRILOSEC) 40 MG capsule Take 1 capsule (40 mg total) by mouth daily.   . SUMAtriptan (IMITREX) 50 MG tablet Take 1 tablet (50 mg total) by mouth every 2 (two) hours as needed.   . traMADol (ULTRAM) 50 MG tablet Take 1 tablet (50 mg total) by mouth every 6 (six) hours as needed.   . [DISCONTINUED] ALPRAZolam (XANAX) 0.25 MG tablet Take 1 tablet (0.25 mg total) by mouth at bedtime as needed.   . [DISCONTINUED] losartan-hydrochlorothiazide (HYZAAR) 100-12.5 MG per tablet Take 1 tablet by mouth daily.  11/28/2017: recalled   . [DISCONTINUED] traMADol (ULTRAM) 50 MG tablet Take 1 tablet (50 mg total) by mouth every 6 (six) hours as needed.   . medroxyPROGESTERone (PROVERA) 10 MG tablet Take 1 tablet (10 mg total) by mouth daily. (Patient not taking: Reported on 09/17/2017)   . olmesartan-hydrochlorothiazide (BENICAR HCT) 40-25 MG tablet Take 1 tablet by mouth daily.    . [DISCONTINUED] clarithromycin (BIAXIN) 500 MG tablet Take 1 tablet (500 mg total) by mouth 2 (two) times daily. (Patient not taking: Reported on 11/28/2017)    No facility-administered encounter medications on file as of 11/28/2017.     Surgical History: Past Surgical History:  Procedure Laterality Date  . CESAREAN SECTION  1989  . COLONOSCOPY  2010   Dr Vira Agar  . KNEE SURGERY Right 1999    Medical History: Past Medical History:  Diagnosis Date  . Anxiety   . Colon polyp 2010  . GERD (gastroesophageal reflux disease)   . Hypercholesteremia   . Hyperlipidemia   . Hypertension   . Migraine     Family History: No family history on file.    Review of Systems  Constitutional: Positive for fatigue. Negative for chills and fever.  HENT: Negative for congestion, ear pain, postnasal drip, rhinorrhea, sinus pain, sore throat and voice change.   Eyes: Negative.   Respiratory: Negative for cough and wheezing.   Cardiovascular: Negative for chest pain and palpitations.       Bp elevated today.  Gastrointestinal: Negative for nausea and vomiting.  Endocrine: Negative for cold intolerance, heat intolerance, polydipsia, polyphagia and polyuria.  Genitourinary: Negative.   Musculoskeletal:       Fatigue/weakness in both legs   Skin: Negative for rash.  Allergic/Immunologic: Positive for environmental allergies.  Neurological: Positive for weakness and headaches.  Hematological: Negative for adenopathy.  Psychiatric/Behavioral: The  patient is nervous/anxious.      Today's Vitals   11/28/17 0911  BP: (!) 160/84  Pulse: 84  Resp: 16  SpO2: 98%  Weight: 163 lb 3.2 oz (74 kg)  Height: 4\' 9"  (1.448 m)    Physical Exam  Nursing note and vitals reviewed. Constitutional: She is oriented to person, place, and time. She appears well-developed and well-nourished.  HENT:  Head: Normocephalic and atraumatic.  Right Ear: External ear normal.  Left Ear: External ear normal.   Eyes: Pupils are equal, round, and reactive to light. EOM are normal.  Neck: Normal range of motion. Neck supple. No JVD present. No thyromegaly present.  Cardiovascular: Normal rate, regular rhythm, normal heart sounds and intact distal pulses.  Respiratory: Effort normal and breath sounds normal. She has no wheezes. Right breast exhibits no inverted nipple, no mass, no nipple discharge, no skin change and no tenderness. Left breast exhibits no inverted nipple, no mass, no nipple discharge, no skin change and no tenderness. Breasts are symmetrical.  GI: Soft. Bowel sounds are normal. There is no tenderness.  Musculoskeletal: Normal range of motion.  Lymphadenopathy:    She has no cervical adenopathy.  Neurological: She is alert and oriented to person, place, and time. No cranial nerve deficit. Coordination normal.  Skin: Skin is warm and dry. No rash noted.  Psychiatric: She has a normal mood and affect. Her behavior is normal. Judgment and thought content normal.   Assessment/Plan: 1. Encounter for general adult medical examination with abnormal findings Annual health maintenance exam today - CBC with Differential/Platelet - Comprehensive metabolic panel - T4, free - TSH  2. Essential hypertension Has not been taking prescribed losartan due to recall. Start benicar/hctz 40/25mg  daily. New rx sent to pharmacy.  - Comprehensive metabolic panel - TSH - olmesartan-hydrochlorothiazide (BENICAR HCT) 40-25 MG tablet; Take 1 tablet by mouth daily.  Dispense: 30 tablet; Refill: 3  3. Mixed hyperlipidemia Check fasting lipid panel - Lipid panel  4. Dorsalgia, unspecified - traMADol (ULTRAM) 50 MG tablet; Take 1 tablet (50 mg total) by mouth every 6 (six) hours as needed.  Dispense: 30 tablet; Refill: 3  5. Vitamin D deficiency - Vitamin D 1,25 dihydroxy  6. Generalized anxiety disorder - ALPRAZolam (XANAX) 0.25 MG tablet; Take 1 tablet (0.25 mg total) by mouth at bedtime as needed.   Dispense: 30 tablet; Refill: 3  7. Dysuria - Urinalysis, Routine w reflex microscopic  8. Screening for breast cancer - MM DIGITAL SCREENING BILATERAL; Future  General Counseling: herta hink understanding of the findings of todays visit and agrees with plan of treatment. I have discussed any further diagnostic evaluation that may be needed or ordered today. We also reviewed her medications today. she has been encouraged to call the office with any questions or concerns that should arise related to todays visit.  Hypertension Counseling:   The following hypertensive lifestyle modification were recommended and discussed:  1. Limiting alcohol intake to less than 1 oz/day of ethanol:(24 oz of beer or 8 oz of wine or 2 oz of 100-proof whiskey). 2. Take baby ASA 81 mg daily. 3. Importance of regular aerobic exercise and losing weight. 4. Reduce dietary saturated fat and cholesterol intake for overall cardiovascular health. 5. Maintaining adequate dietary potassium, calcium, and magnesium intake. 6. Regular monitoring of the blood pressure. 7. Reduce sodium intake to less than 100 mmol/day (less than 2.3 gm of sodium or less than 6 gm of sodium choride)   This patient was seen  by Leretha Pol, FNP- C in Collaboration with Dr Lavera Guise as a part of collaborative care agreement  Orders Placed This Encounter  Procedures  . MM DIGITAL SCREENING BILATERAL  . CBC with Differential/Platelet  . Comprehensive metabolic panel  . T4, free  . TSH  . Lipid panel  . Vitamin D 1,25 dihydroxy  . Urinalysis, Routine w reflex microscopic    Meds ordered this encounter  Medications  . olmesartan-hydrochlorothiazide (BENICAR HCT) 40-25 MG tablet    Sig: Take 1 tablet by mouth daily.    Dispense:  30 tablet    Refill:  3    Order Specific Question:   Supervising Provider    Answer:   Lavera Guise [[id number]]  . ALPRAZolam (XANAX) 0.25 MG tablet    Sig: Take 1 tablet (0.25 mg total) by mouth at  bedtime as needed.    Dispense:  30 tablet    Refill:  3    Order Specific Question:   Supervising Provider    Answer:   Lavera Guise [[id number]]  . traMADol (ULTRAM) 50 MG tablet    Sig: Take 1 tablet (50 mg total) by mouth every 6 (six) hours as needed.    Dispense:  30 tablet    Refill:  3    Order Specific Question:   Supervising Provider    Answer:   Lavera Guise [[id number]]    Time spent: [place], MD  Internal Medicine

## 2017-11-29 LAB — UA/M W/RFLX CULTURE, ROUTINE
Bilirubin, UA: NEGATIVE
Glucose, UA: NEGATIVE
Ketones, UA: NEGATIVE
Leukocytes, UA: NEGATIVE
Nitrite, UA: NEGATIVE
Protein, UA: NEGATIVE
RBC, UA: NEGATIVE
Specific Gravity, UA: 1.015 (ref 1.005–1.030)
Urobilinogen, Ur: 0.2 mg/dL (ref 0.2–1.0)
pH, UA: 5.5 (ref 5.0–7.5)

## 2017-11-29 LAB — MICROSCOPIC EXAMINATION: Casts: NONE SEEN /lpf

## 2017-12-05 ENCOUNTER — Other Ambulatory Visit: Payer: Self-pay

## 2017-12-08 ENCOUNTER — Other Ambulatory Visit: Payer: Self-pay

## 2017-12-08 ENCOUNTER — Telehealth: Payer: Self-pay

## 2017-12-08 NOTE — Telephone Encounter (Signed)
Faxed completed [facility] request for dx codes, and demographics info back to [facility].  dbs

## 2017-12-17 DIAGNOSIS — Z1239 Encounter for other screening for malignant neoplasm of breast: Secondary | ICD-10-CM | POA: Insufficient documentation

## 2017-12-18 ENCOUNTER — Other Ambulatory Visit: Payer: Self-pay | Admitting: Nurse Practitioner

## 2017-12-18 DIAGNOSIS — E559 Vitamin D deficiency, unspecified: Secondary | ICD-10-CM | POA: Diagnosis not present

## 2017-12-18 DIAGNOSIS — Z0001 Encounter for general adult medical examination with abnormal findings: Secondary | ICD-10-CM | POA: Diagnosis not present

## 2017-12-18 DIAGNOSIS — I10 Essential (primary) hypertension: Secondary | ICD-10-CM | POA: Diagnosis not present

## 2017-12-18 DIAGNOSIS — E282 Polycystic ovarian syndrome: Secondary | ICD-10-CM | POA: Diagnosis not present

## 2017-12-19 LAB — LIPID PANEL W/O CHOL/HDL RATIO
Cholesterol, Total: 225 mg/dL — ABNORMAL HIGH (ref 100–199)
HDL: 46 mg/dL (ref >39)
LDL Calculated: 162 mg/dL — ABNORMAL HIGH (ref 0–99)
Triglycerides: 86 mg/dL (ref 0–149)
VLDL Cholesterol Cal: 17 mg/dL (ref 5–40)

## 2017-12-19 LAB — COMPREHENSIVE METABOLIC PANEL
ALT: 10 IU/L (ref 0–32)
AST: 18 IU/L (ref 0–40)
Albumin/Globulin Ratio: 1.3 (ref 1.2–2.2)
Albumin: 3.8 g/dL (ref 3.5–5.5)
Alkaline Phosphatase: 102 IU/L (ref 39–117)
BUN/Creatinine Ratio: 16 (ref 9–23)
BUN: 11 mg/dL (ref 6–24)
Bilirubin Total: <0.2 mg/dL (ref 0.0–1.2)
CO2: 25 mmol/L (ref 20–29)
Calcium: 9 mg/dL (ref 8.7–10.2)
Chloride: 103 mmol/L (ref 96–106)
Creatinine, Ser: 0.7 mg/dL (ref 0.57–1.00)
GFR calc Af Amer: 119 mL/min/{1.73_m2} (ref >59)
GFR calc non Af Amer: 104 mL/min/{1.73_m2} (ref >59)
Globulin, Total: 3 g/dL (ref 1.5–4.5)
Glucose: 101 mg/dL — ABNORMAL HIGH (ref 65–99)
Potassium: 3.7 mmol/L (ref 3.5–5.2)
Sodium: 140 mmol/L (ref 134–144)
Total Protein: 6.8 g/dL (ref 6.0–8.5)

## 2017-12-19 LAB — CBC
Hematocrit: 30.9 % — ABNORMAL LOW (ref 34.0–46.6)
Hemoglobin: 10.1 g/dL — ABNORMAL LOW (ref 11.1–15.9)
MCH: 26.8 pg (ref 26.6–33.0)
MCHC: 32.7 g/dL (ref 31.5–35.7)
MCV: 82 fL (ref 79–97)
Platelets: 411 10*3/uL — ABNORMAL HIGH (ref 150–379)
RBC: 3.77 x10E6/uL (ref 3.77–5.28)
RDW: 15.1 % (ref 12.3–15.4)
WBC: 6.4 10*3/uL (ref 3.4–10.8)

## 2017-12-19 LAB — TSH: TSH: 1.23 u[IU]/mL (ref 0.450–4.500)

## 2017-12-19 LAB — VITAMIN D 25 HYDROXY (VIT D DEFICIENCY, FRACTURES): Vit D, 25-Hydroxy: 21.1 ng/mL — ABNORMAL LOW (ref 30.0–100.0)

## 2017-12-19 LAB — T4, FREE: Free T4: 1.1 ng/dL (ref 0.82–1.77)

## 2017-12-29 ENCOUNTER — Telehealth: Payer: Self-pay

## 2017-12-29 ENCOUNTER — Other Ambulatory Visit: Payer: Self-pay

## 2017-12-30 ENCOUNTER — Other Ambulatory Visit: Payer: Self-pay | Admitting: Nurse Practitioner

## 2017-12-30 DIAGNOSIS — I10 Essential (primary) hypertension: Secondary | ICD-10-CM

## 2017-12-30 MED ORDER — LISINOPRIL-HYDROCHLOROTHIAZIDE 20-12.5 MG PO TABS: 2.0000 | ORAL_TABLET | Freq: Every day | ORAL | 3 refills | Status: DC

## 2017-12-30 NOTE — Telephone Encounter (Signed)
D/c olmesartan/HCTZ and change to lisinopril/HCTZ 20/12.5mg  tablets, taking 2 tablets daily. New rx was sent to [facility].

## 2017-12-30 NOTE — Progress Notes (Signed)
D/c olmesartan/HCTZ and change to lisinopril/HCTZ 20/12.5mg  tablets, taking 2 tablets daily. New rx was sent to [facility].

## 2017-12-31 ENCOUNTER — Encounter: Payer: Self-pay | Admitting: Obstetrics and Gynecology

## 2017-12-31 ENCOUNTER — Ambulatory Visit: Payer: BLUE CROSS/BLUE SHIELD | Admitting: Obstetrics and Gynecology

## 2017-12-31 ENCOUNTER — Ambulatory Visit (INDEPENDENT_AMBULATORY_CARE_PROVIDER_SITE_OTHER): Payer: BLUE CROSS/BLUE SHIELD

## 2017-12-31 VITALS — BP 140/84 | HR 80 | Ht <= 58 in | Wt 169.0 lb

## 2017-12-31 DIAGNOSIS — R102 Pelvic and perineal pain: Secondary | ICD-10-CM

## 2017-12-31 DIAGNOSIS — D219 Benign neoplasm of connective and other soft tissue, unspecified: Secondary | ICD-10-CM

## 2017-12-31 NOTE — Progress Notes (Signed)
Lavera Guise, MD   Chief Complaint  Patient presents with  . Pelvic Pain    Sharp pain with walking x1 day    HPI:      Ms. Joanne Maddox is a 47 y.o. G1P1001 who LMP was Patient's last menstrual period was 12/14/2017 (exact date)., presents today for sharp, stabbing pelvic pain with walking only since yesterday. Sx started spontaneously and last throughout walking. No sx with sitting, lying down. Has had mild sx in past, relieved with gas pills. Never had sx this severe or longstanding. Has had constipation for 3 days. No urin sx, vag sx, unusual back pain. Does heavy lifting at work but nothing different prior to sx. Hx of 6 cm leio on u/s 5/18, eval by Dr. Georgianne Fick. Menses monthly, lasting 4 days, heavy but no change in flow since 5/18, no BTB. Dysmen relieved with midol.    Past Medical History:  Diagnosis Date  . Anxiety   . Colon polyp 2010  . GERD (gastroesophageal reflux disease)   . Hypercholesteremia   . Hyperlipidemia   . Hypertension   . Migraine     Past Surgical History:  Procedure Laterality Date  . CESAREAN SECTION  1989  . COLONOSCOPY  2010   Dr Vira Agar  . KNEE SURGERY Right 1999    History reviewed. No pertinent family history.  Social History   Socioeconomic History  . Marital status: Married    Spouse name: Not on file  . Number of children: Not on file  . Years of education: Not on file  . Highest education level: Not on file  Occupational History  . Not on file  Social Needs  . Financial resource strain: Not on file  . Food insecurity:    Worry: Not on file    Inability: Not on file  . Transportation needs:    Medical: Not on file    Non-medical: Not on file  Tobacco Use  . Smoking status: Never Smoker  . Smokeless tobacco: Never Used  Substance and Sexual Activity  . Alcohol use: Yes    Comment: occasionally  . Drug use: No  . Sexual activity: Yes    Birth control/protection: None  Lifestyle  . Physical activity:    Days per  week: Not on file    Minutes per session: Not on file  . Stress: Not on file  Relationships  . Social connections:    Talks on phone: Not on file    Gets together: Not on file    Attends religious service: Not on file    Active member of club or organization: Not on file    Attends meetings of clubs or organizations: Not on file    Relationship status: Not on file  . Intimate partner violence:    Fear of current or ex partner: Not on file    Emotionally abused: Not on file    Physically abused: Not on file    Forced sexual activity: Not on file  Other Topics Concern  . Not on file  Social History Narrative  . Not on file    Outpatient Medications Prior to Visit  Medication Sig Dispense Refill  . ALPRAZolam (XANAX) 0.25 MG tablet Take 1 tablet (0.25 mg total) by mouth at bedtime as needed. 30 tablet 3  . lisinopril-hydrochlorothiazide (PRINZIDE,ZESTORETIC) 20-12.5 MG tablet Take 2 tablets by mouth daily. 60 tablet 3  . lovastatin (MEVACOR) 40 MG tablet Take 40 mg by mouth at bedtime.     Marland Kitchen  lubiprostone (AMITIZA) 24 MCG capsule Take 24 mcg by mouth as needed for constipation.    Marland Kitchen omeprazole (PRILOSEC) 40 MG capsule Take 1 capsule (40 mg total) by mouth daily. 30 capsule 5  . SUMAtriptan (IMITREX) 50 MG tablet Take 1 tablet (50 mg total) by mouth every 2 (two) hours as needed. 10 tablet 5  . traMADol (ULTRAM) 50 MG tablet Take 1 tablet (50 mg total) by mouth every 6 (six) hours as needed. 30 tablet 3  . medroxyPROGESTERone (PROVERA) 10 MG tablet Take 1 tablet (10 mg total) by mouth daily. (Patient not taking: Reported on 09/17/2017) 30 tablet 11   No facility-administered medications prior to visit.       ROS:  Review of Systems  Constitutional: Positive for fatigue. Negative for fever.  Gastrointestinal: Negative for blood in stool, constipation, diarrhea, nausea and vomiting.  Genitourinary: Positive for pelvic pain. Negative for dyspareunia, dysuria, flank pain, frequency,  hematuria, urgency, vaginal bleeding, vaginal discharge and vaginal pain.  Musculoskeletal: Negative for back pain and gait problem.  Skin: Negative for rash.    OBJECTIVE:   Vitals:  BP 140/84   Pulse 80   Ht 4\' 9"  (1.448 m)   Wt 169 lb (76.7 kg)   LMP 12/14/2017 (Exact Date)   BMI 36.57 kg/m   Physical Exam  Constitutional: She is oriented to person, place, and time. Vital signs are normal. She appears well-developed.  Pulmonary/Chest: Effort normal.  Abdominal: Soft. Normal appearance. There is tenderness in the right lower quadrant, suprapubic area and left lower quadrant. There is no rigidity and no guarding.  TENDER ON PUBIC BONE AS WELL AS SUPRAPUBIC AREA  Genitourinary: Vagina normal. There is no rash, tenderness or lesion on the right labia. There is no rash, tenderness or lesion on the left labia. Uterus is enlarged and tender. Cervix exhibits no motion tenderness. Right adnexum displays no mass and no tenderness. Left adnexum displays no mass and no tenderness. No erythema or tenderness in the vagina. No vaginal discharge found.  Musculoskeletal: Normal range of motion.  Neurological: She is alert and oriented to person, place, and time.  Psychiatric: She has a normal mood and affect. Her behavior is normal. Judgment and thought content normal.  Vitals reviewed.   Results:  ULTRASOUND REPORT  Location: [facility]  Date of Service: 12/31/2017    Indications:Pelvic Pain Findings:  The uterus is retroverted and measures 12.97 x 9.69 x 8.57cm. Echo texture is heterogenous with evidence of focal masses. Within the uterus is a suspected fibroid measuring: Fibroid 1: Posterior Fundal, intramural, 6.49 x 8.11cm (previously 5.06 x 6.49cm)  The Endometrium measures 3.34 mm.  Right Ovary is not identified Left Ovary measures 4.33 x 4.66 x 2.67 cm. It is normal in appearance. Survey of the adnexa demonstrates no adnexal masses. There is no free fluid in the cul  de sac.  Impression: 1. Enlarged, fibroid uterus  Recommendations: 1.Clinical correlation with the patient's History and Physical Exam.   Edwena Bunde, RDMS, RVT   Assessment/Plan: Pelvic pain - U/S with slightly increased leio since 5/18. Given sx with walking only, question MSK as more likely etiology. F/u with chiro. If sx persist, f/u  Dr. 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 Menses same as last yr. Recommend yearly u/s for assessment if menorrhagia doesn't progress. F/u with Dr. Georgianne Fick prn. - Plan: US PELVIS TRANSVANGINAL NON-OB (TV ONLY)    Return if symptoms worsen  or fail to improve.  [employer] B. Darin Redmann, PA-C 12/31/2017 1:46 PM

## 2017-12-31 NOTE — Patient Instructions (Signed)
I value your feedback and entrusting us with your care. If you get a [facility] patient survey, I would appreciate you taking the time to let us know about your experience today. Thank you! 

## 2018-01-01 DIAGNOSIS — I10 Essential (primary) hypertension: Secondary | ICD-10-CM | POA: Diagnosis not present

## 2018-01-01 DIAGNOSIS — Z881 Allergy status to other antibiotic agents status: Secondary | ICD-10-CM | POA: Diagnosis not present

## 2018-01-01 DIAGNOSIS — S76211A Strain of adductor muscle, fascia and tendon of right thigh, initial encounter: Secondary | ICD-10-CM | POA: Diagnosis not present

## 2018-01-01 DIAGNOSIS — R102 Pelvic and perineal pain: Secondary | ICD-10-CM | POA: Diagnosis not present

## 2018-01-01 DIAGNOSIS — Z6835 Body mass index (BMI) 35.0-35.9, adult: Secondary | ICD-10-CM | POA: Diagnosis not present

## 2018-01-01 DIAGNOSIS — M160 Bilateral primary osteoarthritis of hip: Secondary | ICD-10-CM | POA: Diagnosis not present

## 2018-01-01 DIAGNOSIS — X58XXXA Exposure to other specified factors, initial encounter: Secondary | ICD-10-CM | POA: Diagnosis not present

## 2018-01-01 DIAGNOSIS — S39011A Strain of muscle, fascia and tendon of abdomen, initial encounter: Secondary | ICD-10-CM | POA: Diagnosis not present

## 2018-01-01 DIAGNOSIS — R1031 Right lower quadrant pain: Secondary | ICD-10-CM | POA: Diagnosis not present

## 2018-01-01 NOTE — Telephone Encounter (Signed)
Pt informed of medication change to lisinopril-HCTZ.  dbs

## 2018-01-02 ENCOUNTER — Telehealth: Payer: Self-pay

## 2018-01-02 ENCOUNTER — Other Ambulatory Visit: Payer: Self-pay | Admitting: Nurse Practitioner

## 2018-01-02 DIAGNOSIS — E559 Vitamin D deficiency, unspecified: Secondary | ICD-10-CM

## 2018-01-02 DIAGNOSIS — D509 Iron deficiency anemia, unspecified: Secondary | ICD-10-CM

## 2018-01-02 MED ORDER — ERGOCALCIFEROL 1.25 MG (50000 UT) PO CAPS: 50000.0000 [IU] | ORAL_CAPSULE | ORAL | 5 refills | Status: DC

## 2018-01-02 MED ORDER — FERRALET 90 90-1 MG PO TABS: 1.0000 | ORAL_TABLET | Freq: Every day | ORAL | 3 refills | Status: DC

## 2018-01-02 NOTE — Telephone Encounter (Signed)
Review of labs indicates mild anemia. I added ferralet, iron supplement, to meds and sent rx to pharmacy. I have a sample and copay card for her, which she can pick up any time. Also, vitamin d was low. I did add drisdol weekly for next 6 months, also sent to the pharmacy. All other labs looked good. Discuss in detail at her next visit.

## 2018-01-02 NOTE — Telephone Encounter (Signed)
Spoke to pt and gave lab results and informed her that Heather added iron supplement and also drisdol for 6 months.  dbs

## 2018-01-02 NOTE — Progress Notes (Signed)
Review of labs indicates mild anemia. I added ferralet, iron supplement, to meds and sent rx to pharmacy. I have a sample and copay card for her, which she can pick up any time. Also, vitamin d was low. I did add drisdol weekly for next 6 months, also sent to the pharmacy. All other labs looked good. Discuss in detail at her next visit.

## 2018-01-07 DIAGNOSIS — M7061 Trochanteric bursitis, right hip: Secondary | ICD-10-CM | POA: Diagnosis not present

## 2018-01-20 ENCOUNTER — Other Ambulatory Visit: Payer: Self-pay | Admitting: Nurse Practitioner

## 2018-01-20 ENCOUNTER — Telehealth: Payer: Self-pay

## 2018-01-20 DIAGNOSIS — J0140 Acute pansinusitis, unspecified: Secondary | ICD-10-CM

## 2018-01-20 MED ORDER — AMOXICILLIN 500 MG PO CAPS: 500.0000 mg | ORAL_CAPSULE | Freq: Three times a day (TID) | ORAL | 0 refills | Status: DC

## 2018-01-20 NOTE — Progress Notes (Signed)
Patient called c/o sinusitis. Sent in rx for amoicillin 500mg  three times daily for 10 days. Recommend she take OTC medication as indicated to relieve symptoms. Rest and increase fluids.

## 2018-01-20 NOTE — Telephone Encounter (Signed)
Patient called c/o sinusitis. Sent in rx for amoicillin 500mg  three times daily for 10 days. Recommend she take OTC medication as indicated to relieve symptoms. Rest and increase fluids.

## 2018-01-21 NOTE — Telephone Encounter (Signed)
Pt advised that we send ABX for sinusitis

## 2018-03-17 ENCOUNTER — Other Ambulatory Visit: Payer: Self-pay | Admitting: Nurse Practitioner

## 2018-03-17 DIAGNOSIS — K219 Gastro-esophageal reflux disease without esophagitis: Secondary | ICD-10-CM

## 2018-03-17 MED ORDER — OMEPRAZOLE 40 MG PO CPDR: 40.0000 mg | DELAYED_RELEASE_CAPSULE | Freq: Every day | ORAL | 5 refills | Status: DC

## 2018-03-20 DIAGNOSIS — Z1231 Encounter for screening mammogram for malignant neoplasm of breast: Secondary | ICD-10-CM | POA: Diagnosis not present

## 2018-03-30 ENCOUNTER — Encounter: Payer: Self-pay | Admitting: Nurse Practitioner

## 2018-03-30 ENCOUNTER — Ambulatory Visit: Payer: BLUE CROSS/BLUE SHIELD | Admitting: Nurse Practitioner

## 2018-03-30 ENCOUNTER — Encounter (INDEPENDENT_AMBULATORY_CARE_PROVIDER_SITE_OTHER): Payer: Self-pay

## 2018-03-30 VITALS — BP 125/80 | HR 81 | Resp 16 | Ht <= 58 in | Wt 156.2 lb

## 2018-03-30 DIAGNOSIS — I10 Essential (primary) hypertension: Secondary | ICD-10-CM

## 2018-03-30 DIAGNOSIS — F411 Generalized anxiety disorder: Secondary | ICD-10-CM

## 2018-03-30 DIAGNOSIS — M549 Dorsalgia, unspecified: Secondary | ICD-10-CM

## 2018-03-30 DIAGNOSIS — G43119 Migraine with aura, intractable, without status migrainosus: Secondary | ICD-10-CM

## 2018-03-30 MED ORDER — TRAMADOL HCL 50 MG PO TABS: 50.0000 mg | ORAL_TABLET | Freq: Four times a day (QID) | ORAL | 3 refills | Status: DC | PRN

## 2018-03-30 MED ORDER — ALPRAZOLAM 0.25 MG PO TABS: 0.2500 mg | ORAL_TABLET | Freq: Every evening | ORAL | 3 refills | Status: DC | PRN

## 2018-03-30 NOTE — Progress Notes (Signed)
[facility] [place], [place] [address]  Internal MEDICINE  Office Visit Note  Patient Name: Joanne Maddox  [id number]  [id number]  Date of Service: 04/27/2018   Pt is here for routine follow up.   Chief Complaint  Patient presents with  . Hypertension    follow up    Hypertension  This is a chronic problem. The current episode started more than 1 year ago. The problem is unchanged. The problem is controlled. Associated symptoms include headaches. Pertinent negatives include no chest pain or palpitations. Agents associated with hypertension include NSAIDs. Risk factors for coronary artery disease include stress and dyslipidemia. Past treatments include ACE inhibitors and diuretics. The current treatment provides significant improvement. There are no compliance problems.        Current Medication: Outpatient Encounter Medications as of 03/30/2018  Medication Sig Note  . ALPRAZolam (XANAX) 0.25 MG tablet Take 1 tablet (0.25 mg total) by mouth at bedtime as needed.   . ergocalciferol (DRISDOL) 50000 units capsule Take 1 capsule (50,000 Units total) by mouth once a week.   . Fe Cbn-Fe Gluc-FA-B12-C-DSS (FERRALET 90) 90-1 MG TABS Take 1 tablet by mouth daily.   Marland Kitchen lisinopril-hydrochlorothiazide (PRINZIDE,ZESTORETIC) 20-12.5 MG tablet Take 2 tablets by mouth daily.   Marland Kitchen lovastatin (MEVACOR) 40 MG tablet Take 40 mg by mouth at bedtime.  01/05/2014: Received from: External Pharmacy  . lubiprostone (AMITIZA) 24 MCG capsule Take 24 mcg by mouth as needed for constipation.   Marland Kitchen omeprazole (PRILOSEC) 40 MG capsule Take 1 capsule (40 mg total) by mouth daily.   . SUMAtriptan (IMITREX) 50 MG tablet Take 1 tablet (50 mg total) by mouth every 2 (two) hours as needed.   . [DISCONTINUED] ALPRAZolam (XANAX) 0.25 MG tablet Take 1 tablet (0.25 mg total) by mouth at bedtime as needed.   . medroxyPROGESTERone (PROVERA) 10 MG tablet Take 1 tablet (10 mg total) by mouth daily. (Patient  not taking: Reported on 09/17/2017)   . traMADol (ULTRAM) 50 MG tablet Take 1 tablet (50 mg total) by mouth every 6 (six) hours as needed.   . [DISCONTINUED] amoxicillin (AMOXIL) 500 MG capsule Take 1 capsule (500 mg total) by mouth 3 (three) times daily. (Patient not taking: Reported on 03/30/2018)   . [DISCONTINUED] traMADol (ULTRAM) 50 MG tablet Take 1 tablet (50 mg total) by mouth every 6 (six) hours as needed.    No facility-administered encounter medications on file as of 03/30/2018.     Surgical History: Past Surgical History:  Procedure Laterality Date  . CESAREAN SECTION  1989  . COLONOSCOPY  2010   Dr Vira Agar  . KNEE SURGERY Right 1999    Medical History: Past Medical History:  Diagnosis Date  . Anxiety   . Colon polyp 2010  . GERD (gastroesophageal reflux disease)   . Hypercholesteremia   . Hyperlipidemia   . Hypertension   . Migraine     Family History: No family history on file.  Social History   Socioeconomic History  . Marital status: Married    Spouse name: Not on file  . Number of children: Not on file  . Years of education: Not on file  . Highest education level: Not on file  Occupational History  . Not on file  Social Needs  . Financial resource strain: Not on file  . Food insecurity:    Worry: Not on file    Inability: Not on file  . Transportation needs:    Medical: Not  on file    Non-medical: Not on file  Tobacco Use  . Smoking status: Never Smoker  . Smokeless tobacco: Never Used  Substance and Sexual Activity  . Alcohol use: Yes    Comment: occasionally  . Drug use: No  . Sexual activity: Yes    Birth control/protection: None  Lifestyle  . Physical activity:    Days per week: Not on file    Minutes per session: Not on file  . Stress: Not on file  Relationships  . Social connections:    Talks on phone: Not on file    Gets together: Not on file    Attends religious service: Not on file    Active member of club or organization: Not  on file    Attends meetings of clubs or organizations: Not on file    Relationship status: Not on file  . Intimate partner violence:    Fear of current or ex partner: Not on file    Emotionally abused: Not on file    Physically abused: Not on file    Forced sexual activity: Not on file  Other Topics Concern  . Not on file  Social History Narrative  . Not on file      Review of Systems  Constitutional: Positive for fatigue. Negative for chills and fever.  HENT: Negative for congestion, ear pain, postnasal drip, rhinorrhea, sinus pain, sore throat and voice change.   Eyes: Negative.   Respiratory: Negative for cough and wheezing.   Cardiovascular: Negative for chest pain and palpitations.  Gastrointestinal: Negative for nausea and vomiting.  Endocrine: Negative for cold intolerance, heat intolerance, polydipsia, polyphagia and polyuria.  Genitourinary: Negative.   Musculoskeletal:       Fatigue/weakness in both legs  Generalized joint pain  Skin: Negative for rash.  Allergic/Immunologic: Positive for environmental allergies.  Neurological: Positive for weakness and headaches.  Hematological: Negative for adenopathy.  Psychiatric/Behavioral: The patient is nervous/anxious.     Today's Vitals   03/30/18 1551  BP: 125/80  Pulse: 81  Resp: 16  SpO2: 100%  Weight: 156 lb 3.2 oz (70.9 kg)  Height: 4\' 9"  (1.448 m)   Physical Exam  Constitutional: She is oriented to person, place, and time. She appears well-developed and well-nourished. No distress.  HENT:  Head: Normocephalic and atraumatic.  Nose: Nose normal.  Mouth/Throat: Oropharynx is clear and moist. No oropharyngeal exudate.  Eyes: Pupils are equal, round, and reactive to light. Conjunctivae and EOM are normal.  Neck: Normal range of motion. Neck supple. No JVD present. Carotid bruit is not present. No tracheal deviation present. No thyromegaly present.  Cardiovascular: Normal rate, regular rhythm and normal heart  sounds. Exam reveals no gallop and no friction rub.  No murmur heard. Pulmonary/Chest: Effort normal and breath sounds normal. No respiratory distress. She has no wheezes. She has no rales. She exhibits no tenderness.  Abdominal: Soft. Bowel sounds are normal. There is no tenderness.  Musculoskeletal: Normal range of motion.  Generalized joint pain without point tenderness present.   Lymphadenopathy:    She has no cervical adenopathy.  Neurological: She is alert and oriented to person, place, and time. No cranial nerve deficit.  Skin: Skin is warm and dry. Capillary refill takes less than 2 seconds. She is not diaphoretic.  Psychiatric: She has a normal mood and affect. Her behavior is normal. Judgment and thought content normal.  Nursing note and vitals reviewed.  Assessment/Plan: 1. Essential hypertension Generally stable. Continue bp medication as  prescribed.   2. Dorsalgia, unspecified Continue NSAIDs as needed and as prescribed for pain/inflammation. New for for tramadol #30 tablets sent to pharmacy. May use as needed for severe pain. Reviewed risk factors and possible side effects associated with taking narcotic pain relievers.  - traMADol (ULTRAM) 50 MG tablet; Take 1 tablet (50 mg total) by mouth every 6 (six) hours as needed.  Dispense: 30 tablet; Refill: 3  3. Generalized anxiety disorder May continue alprazolam 0.25mg  at bedtime as needed.  - ALPRAZolam (XANAX) 0.25 MG tablet; Take 1 tablet (0.25 mg total) by mouth at bedtime as needed.  Dispense: 30 tablet; Refill: 3  4. Migraine with aura, intractable, without status migrainosus Sumatriptan as needed and as prescribed for acute migraines.   General Counseling: kirstin kugler understanding of the findings of todays visit and agrees with plan of treatment. I have discussed any further diagnostic evaluation that may be needed or ordered today. We also reviewed her medications today. she has been encouraged to call the office  with any questions or concerns that should arise related to todays visit.    Counseling:  Reviewed risks and possible side effects associated with taking opiates and benzodiazepines. Combination of these could cause dizziness and drowsiness. Advised patient not to drive or operate machinery when taking these medications, as patient's and other's life can be at risk and will have consequences. Patient verbalized understanding in this matter.   This patient was seen by [place] with Dr Lavera Guise as a part of collaborative care agreement  Meds ordered this encounter  Medications  . traMADol (ULTRAM) 50 MG tablet    Sig: Take 1 tablet (50 mg total) by mouth every 6 (six) hours as needed.    Dispense:  30 tablet    Refill:  3    Order Specific Question:   Supervising Provider    Answer:   Lavera Guise [[id number]]  . ALPRAZolam (XANAX) 0.25 MG tablet    Sig: Take 1 tablet (0.25 mg total) by mouth at bedtime as needed.    Dispense:  30 tablet    Refill:  3    Order Specific Question:   Supervising Provider    Answer:   Lavera Guise [[id number]]    Time spent: 37 Minutes          Dr Lavera Guise Internal medicine

## 2018-04-15 DIAGNOSIS — R928 Other abnormal and inconclusive findings on diagnostic imaging of breast: Secondary | ICD-10-CM | POA: Diagnosis not present

## 2018-04-29 ENCOUNTER — Other Ambulatory Visit: Payer: Self-pay

## 2018-04-29 DIAGNOSIS — I10 Essential (primary) hypertension: Secondary | ICD-10-CM

## 2018-04-29 MED ORDER — LISINOPRIL-HYDROCHLOROTHIAZIDE 20-12.5 MG PO TABS: 2.0000 | ORAL_TABLET | Freq: Every day | ORAL | 3 refills | Status: DC

## 2018-05-04 ENCOUNTER — Other Ambulatory Visit: Payer: Self-pay | Admitting: Nurse Practitioner

## 2018-05-04 DIAGNOSIS — D509 Iron deficiency anemia, unspecified: Secondary | ICD-10-CM

## 2018-05-04 MED ORDER — FERRALET 90 90-1 MG PO TABS: 1.0000 | ORAL_TABLET | Freq: Every day | ORAL | 3 refills | Status: DC

## 2018-06-18 ENCOUNTER — Other Ambulatory Visit: Payer: Self-pay | Admitting: Nurse Practitioner

## 2018-06-18 DIAGNOSIS — E559 Vitamin D deficiency, unspecified: Secondary | ICD-10-CM

## 2018-06-18 MED ORDER — ERGOCALCIFEROL 1.25 MG (50000 UT) PO CAPS
50000.0000 [IU] | ORAL_CAPSULE | ORAL | 5 refills | Status: DC
Start: 2018-06-18 — End: 2019-01-04

## 2018-07-23 ENCOUNTER — Other Ambulatory Visit: Payer: Self-pay | Admitting: Nurse Practitioner

## 2018-07-23 DIAGNOSIS — I10 Essential (primary) hypertension: Secondary | ICD-10-CM

## 2018-08-04 ENCOUNTER — Encounter: Payer: Self-pay | Admitting: Nurse Practitioner

## 2018-08-04 ENCOUNTER — Ambulatory Visit: Payer: BLUE CROSS/BLUE SHIELD | Admitting: Nurse Practitioner

## 2018-08-04 VITALS — BP 117/76 | HR 83 | Resp 16 | Ht <= 58 in | Wt 146.6 lb

## 2018-08-04 DIAGNOSIS — K219 Gastro-esophageal reflux disease without esophagitis: Secondary | ICD-10-CM

## 2018-08-04 DIAGNOSIS — D509 Iron deficiency anemia, unspecified: Secondary | ICD-10-CM | POA: Diagnosis not present

## 2018-08-04 DIAGNOSIS — F411 Generalized anxiety disorder: Secondary | ICD-10-CM

## 2018-08-04 DIAGNOSIS — J029 Acute pharyngitis, unspecified: Secondary | ICD-10-CM

## 2018-08-04 DIAGNOSIS — M549 Dorsalgia, unspecified: Secondary | ICD-10-CM | POA: Diagnosis not present

## 2018-08-04 LAB — POCT RAPID STREP A (OFFICE): Rapid Strep A Screen: NEGATIVE

## 2018-08-04 MED ORDER — ALPRAZOLAM 0.25 MG PO TABS: 0.2500 mg | ORAL_TABLET | Freq: Every evening | ORAL | 3 refills | Status: DC | PRN

## 2018-08-04 MED ORDER — TRAMADOL HCL 50 MG PO TABS: 50.0000 mg | ORAL_TABLET | Freq: Four times a day (QID) | ORAL | 3 refills | Status: DC | PRN

## 2018-08-04 MED ORDER — AMOXICILLIN 875 MG PO TABS: 875.0000 mg | ORAL_TABLET | Freq: Two times a day (BID) | ORAL | 0 refills | Status: DC

## 2018-08-04 MED ORDER — OMEPRAZOLE 40 MG PO CPDR: 40.0000 mg | DELAYED_RELEASE_CAPSULE | Freq: Every day | ORAL | 5 refills | Status: DC

## 2018-08-04 MED ORDER — FERRALET 90 90-1 MG PO TABS: 1.0000 | ORAL_TABLET | Freq: Every day | ORAL | 3 refills | Status: DC

## 2018-08-04 NOTE — Progress Notes (Signed)
[facility] [place],  [address]  Internal MEDICINE  Office Visit Note  Patient Name: Joanne Maddox  [id number]  [id number]  Date of Service: 08/05/2018  Chief Complaint  Patient presents with  . Medical Management of Chronic Issues    4 month follow up  . Sore Throat    pt has had a sore throat and cough,   . Cough    Sore Throat   This is a new problem. The current episode started in the past 7 days. The problem has been unchanged. Neither side of throat is experiencing more pain than the other. There has been no fever. The pain is at a severity of 4/10. Associated symptoms include congestion, coughing, headaches, a hoarse voice, neck pain and swollen glands. Pertinent negatives include no diarrhea, ear pain or vomiting. She has tried cool liquids, gargles and acetaminophen for the symptoms. The treatment provided mild relief.  Cough  This is a new problem. The current episode started in the past 7 days. The problem has been unchanged. The problem occurs every few hours. The cough is non-productive. Associated symptoms include ear congestion, headaches, nasal congestion, postnasal drip, rhinorrhea and a sore throat. Pertinent negatives include no chest pain, chills, ear pain, fever, rash or wheezing. Nothing aggravates the symptoms. She has tried cool air and rest for the symptoms. The treatment provided mild relief. Her past medical history is significant for environmental allergies.       Current Medication: Outpatient Encounter Medications as of 08/04/2018  Medication Sig Note  . ALPRAZolam (XANAX) 0.25 MG tablet Take 1 tablet (0.25 mg total) by mouth at bedtime as needed.   . ergocalciferol (DRISDOL) 50000 units capsule Take 1 capsule (50,000 Units total) by mouth once a week.   . Fe Cbn-Fe Gluc-FA-B12-C-DSS (FERRALET 90) 90-1 MG TABS Take 1 tablet by mouth daily.   Marland Kitchen lisinopril-hydrochlorothiazide (PRINZIDE,ZESTORETIC) 20-12.5 MG tablet TAKE 2  TABLETS BY MOUTH EVERY DAY   . lovastatin (MEVACOR) 40 MG tablet Take 40 mg by mouth at bedtime.  01/05/2014: Received from: External Pharmacy  . lubiprostone (AMITIZA) 24 MCG capsule Take 24 mcg by mouth as needed for constipation.   Marland Kitchen omeprazole (PRILOSEC) 40 MG capsule Take 1 capsule (40 mg total) by mouth daily.   . SUMAtriptan (IMITREX) 50 MG tablet Take 1 tablet (50 mg total) by mouth every 2 (two) hours as needed.   . traMADol (ULTRAM) 50 MG tablet Take 1 tablet (50 mg total) by mouth every 6 (six) hours as needed.   . [DISCONTINUED] ALPRAZolam (XANAX) 0.25 MG tablet Take 1 tablet (0.25 mg total) by mouth at bedtime as needed.   . [DISCONTINUED] Fe Cbn-Fe Gluc-FA-B12-C-DSS (FERRALET 90) 90-1 MG TABS Take 1 tablet by mouth daily.   . [DISCONTINUED] omeprazole (PRILOSEC) 40 MG capsule Take 1 capsule (40 mg total) by mouth daily.   . [DISCONTINUED] traMADol (ULTRAM) 50 MG tablet Take 1 tablet (50 mg total) by mouth every 6 (six) hours as needed.   Marland Kitchen amoxicillin (AMOXIL) 875 MG tablet Take 1 tablet (875 mg total) by mouth 2 (two) times daily.   . medroxyPROGESTERone (PROVERA) 10 MG tablet Take 1 tablet (10 mg total) by mouth daily. (Patient not taking: Reported on 09/17/2017)    No facility-administered encounter medications on file as of 08/04/2018.     Surgical History: Past Surgical History:  Procedure Laterality Date  . CESAREAN SECTION  1989  . COLONOSCOPY  2010   Dr Vira Agar  .  KNEE SURGERY Right 1999    Medical History: Past Medical History:  Diagnosis Date  . Anxiety   . Colon polyp 2010  . GERD (gastroesophageal reflux disease)   . Hypercholesteremia   . Hyperlipidemia   . Hypertension   . Migraine     Family History: History reviewed. No pertinent family history.  Social History   Socioeconomic History  . Marital status: Married    Spouse name: Not on file  . Number of children: Not on file  . Years of education: Not on file  . Highest education level: Not on  file  Occupational History  . Not on file  Social Needs  . Financial resource strain: Not on file  . Food insecurity:    Worry: Not on file    Inability: Not on file  . Transportation needs:    Medical: Not on file    Non-medical: Not on file  Tobacco Use  . Smoking status: Never Smoker  . Smokeless tobacco: Never Used  Substance and Sexual Activity  . Alcohol use: Yes    Comment: occasionally  . Drug use: No  . Sexual activity: Yes    Birth control/protection: None  Lifestyle  . Physical activity:    Days per week: Not on file    Minutes per session: Not on file  . Stress: Not on file  Relationships  . Social connections:    Talks on phone: Not on file    Gets together: Not on file    Attends religious service: Not on file    Active member of club or organization: Not on file    Attends meetings of clubs or organizations: Not on file    Relationship status: Not on file  . Intimate partner violence:    Fear of current or ex partner: Not on file    Emotionally abused: Not on file    Physically abused: Not on file    Forced sexual activity: Not on file  Other Topics Concern  . Not on file  Social History Narrative  . Not on file      Review of Systems  Constitutional: Positive for fatigue. Negative for chills and fever.  HENT: Positive for congestion, hoarse voice, postnasal drip, rhinorrhea and sore throat. Negative for ear pain, sinus pain and voice change.   Eyes: Negative.   Respiratory: Positive for cough. Negative for wheezing.   Cardiovascular: Negative for chest pain and palpitations.  Gastrointestinal: Negative for diarrhea, nausea and vomiting.  Endocrine: Negative for cold intolerance, heat intolerance, polydipsia, polyphagia and polyuria.  Musculoskeletal: Positive for arthralgias, back pain and neck pain.       Fatigue/weakness in both legs  Generalized joint pain  Skin: Negative for rash.  Allergic/Immunologic: Positive for environmental allergies.   Neurological: Positive for weakness and headaches.  Hematological: Negative for adenopathy.  Psychiatric/Behavioral: The patient is nervous/anxious.     Today's Vitals   08/04/18 1614  BP: 117/76  Pulse: 83  Resp: 16  SpO2: 99%  Weight: 146 lb 9.6 oz (66.5 kg)  Height: 4\' 9"  (1.448 m)    Physical Exam  Constitutional: She is oriented to person, place, and time. She appears well-developed and well-nourished. No distress.  HENT:  Head: Normocephalic and atraumatic.  Right Ear: External ear normal.  Left Ear: External ear normal.  Nose: Rhinorrhea present. Right sinus exhibits frontal sinus tenderness. Left sinus exhibits frontal sinus tenderness.  Mouth/Throat: Posterior oropharyngeal erythema present. No oropharyngeal exudate.  Eyes: Pupils are  equal, round, and reactive to light. Conjunctivae and EOM are normal.  Neck: Normal range of motion. Neck supple. No JVD present. Carotid bruit is not present. No tracheal deviation present. No thyromegaly present.  Cardiovascular: Normal rate, regular rhythm and normal heart sounds. Exam reveals no gallop and no friction rub.  No murmur heard. Pulmonary/Chest: Effort normal and breath sounds normal. No respiratory distress. She has no wheezes. She has no rales. She exhibits no tenderness.  Dry, non-productive cough present.   Abdominal: Soft. Bowel sounds are normal. There is no tenderness.  Musculoskeletal: Normal range of motion.  Generalized joint pain without point tenderness present.   Lymphadenopathy:    She has cervical adenopathy.  Neurological: She is alert and oriented to person, place, and time. No cranial nerve deficit.  Skin: Skin is warm and dry. Capillary refill takes less than 2 seconds. She is not diaphoretic.  Psychiatric: She has a normal mood and affect. Her behavior is normal. Judgment and thought content normal.  Nursing note and vitals reviewed.  Assessment/Plan: 1. Sore throat - POCT rapid strep A negative.  Will start amoxicillin 875mg  twice daily. Gargle with warm salt water. Samples cepacol throat lozenges were given to help sore throat.  - amoxicillin (AMOXIL) 875 MG tablet; Take 1 tablet (875 mg total) by mouth 2 (two) times daily.  Dispense: 20 tablet; Refill: 0  2. Dorsalgia, unspecified May continue tramadol as needed and as prescribed for acute back and joint pain.  - traMADol (ULTRAM) 50 MG tablet; Take 1 tablet (50 mg total) by mouth every 6 (six) hours as needed.  Dispense: 30 tablet; Refill: 3  3. Gastro-esophageal reflux disease without esophagitis - omeprazole (PRILOSEC) 40 MG capsule; Take 1 capsule (40 mg total) by mouth daily.  Dispense: 30 capsule; Refill: 5  4. Iron deficiency anemia, unspecified iron deficiency anemia type - Fe Cbn-Fe Gluc-FA-B12-C-DSS (FERRALET 90) 90-1 MG TABS; Take 1 tablet by mouth daily.  Dispense: 30 each; Refill: 3  5. Generalized anxiety disorder May continue alprazolam 0.25mg  at bedtime as needed for anxiety/insomnia.  - ALPRAZolam (XANAX) 0.25 MG tablet; Take 1 tablet (0.25 mg total) by mouth at bedtime as needed.  Dispense: 30 tablet; Refill: 3  General Counseling: Joanne Maddox verbalizes understanding of the findings of todays visit and agrees with plan of treatment. I have discussed any further diagnostic evaluation that may be needed or ordered today. We also reviewed her medications today. she has been encouraged to call the office with any questions or concerns that should arise related to todays visit.  Rest and increase fluids. Continue using OTC medication to control symptoms.   Reviewed risks and possible side effects associated with taking opiates, benzodiazepines and other CNS depressants. Combination of these could cause dizziness and drowsiness. Advised patient not to drive or operate machinery when taking these medications, as patient's and other's life can be at risk and will have consequences. Patient verbalized understanding in this matter.  Dependence and abuse for these drugs will be monitored closely. A Controlled substance policy and procedure is on file which allows [facility] to order a urine drug screen test at any visit. Patient understands and agrees with the plan  This patient was seen by Leretha Pol FNP Collaboration with Dr Lavera Guise as a part of collaborative care agreement  Orders Placed This Encounter  Procedures  . POCT rapid strep A    Meds ordered this encounter  Medications  . amoxicillin (AMOXIL) 875 MG tablet  Sig: Take 1 tablet (875 mg total) by mouth 2 (two) times daily.    Dispense:  20 tablet    Refill:  0    Order Specific Question:   Supervising Provider    Answer:   Lavera Guise [[id number]]  . Fe Cbn-Fe Gluc-FA-B12-C-DSS (FERRALET 90) 90-1 MG TABS    Sig: Take 1 tablet by mouth daily.    Dispense:  30 each    Refill:  3    Patient given new copay card    Order Specific Question:   Supervising Provider    Answer:   Lavera Guise [[id number]]  . ALPRAZolam (XANAX) 0.25 MG tablet    Sig: Take 1 tablet (0.25 mg total) by mouth at bedtime as needed.    Dispense:  30 tablet    Refill:  3    Order Specific Question:   Supervising Provider    Answer:   Lavera Guise [[id number]]  . traMADol (ULTRAM) 50 MG tablet    Sig: Take 1 tablet (50 mg total) by mouth every 6 (six) hours as needed.    Dispense:  30 tablet    Refill:  3    Order Specific Question:   Supervising Provider    Answer:   Lavera Guise [[id number]]  . omeprazole (PRILOSEC) 40 MG capsule    Sig: Take 1 capsule (40 mg total) by mouth daily.    Dispense:  30 capsule    Refill:  5    Order Specific Question:   Supervising Provider    Answer:   Lavera Guise [[id number]]    Time spent: 21 Minutes      Dr Lavera Guise Internal medicine

## 2018-08-05 DIAGNOSIS — J029 Acute pharyngitis, unspecified: Secondary | ICD-10-CM | POA: Insufficient documentation

## 2018-08-05 DIAGNOSIS — D509 Iron deficiency anemia, unspecified: Secondary | ICD-10-CM | POA: Insufficient documentation

## 2018-08-06 ENCOUNTER — Telehealth: Payer: Self-pay

## 2018-08-07 ENCOUNTER — Other Ambulatory Visit: Payer: Self-pay | Admitting: Nurse Practitioner

## 2018-08-07 DIAGNOSIS — J0140 Acute pansinusitis, unspecified: Secondary | ICD-10-CM

## 2018-08-07 MED ORDER — METHYLPREDNISOLONE 4 MG PO TBPK: ORAL_TABLET | ORAL | 0 refills | Status: DC

## 2018-08-07 MED ORDER — AZITHROMYCIN 250 MG PO TABS: ORAL_TABLET | ORAL | 0 refills | Status: DC

## 2018-08-07 NOTE — Telephone Encounter (Signed)
New, acute sinusitis. Sent in z-pack and medrol 6 day dose pack. Both to [facility] in [place]. Take OTC medications to alleviate symptoms as needed. Rest and increase fluids.

## 2018-08-07 NOTE — Telephone Encounter (Signed)
Left a message on pt voicemail informing her of the new rx's and instructions given per Heather.

## 2018-08-07 NOTE — Progress Notes (Signed)
New, acute sinusitis. Sent in z-pack and medrol 6 day dose pack. Both to [facility] in [place]. Take OTC medications to alleviate symptoms as needed. Rest and increase fluids.

## 2018-09-29 ENCOUNTER — Telehealth: Payer: Self-pay

## 2018-09-29 NOTE — Telephone Encounter (Signed)
PT WAS NOTIFIED. 

## 2018-09-29 NOTE — Telephone Encounter (Signed)
Does she have a copay card for the ferralet? If not, we can get her one. Should keep it to $20 or less.

## 2018-09-29 NOTE — Telephone Encounter (Signed)
Pt said she has the copay card but only brings her medication down to $56 and that is still high for her.

## 2018-09-29 NOTE — Telephone Encounter (Signed)
Pt said it only brings her medication down to $56 and that is still too much for her.

## 2018-09-29 NOTE — Telephone Encounter (Signed)
Please let her know to take OTC iron and b complex vitamin instead. This should be more affordable. May need to take stool softener as well.

## 2018-11-30 ENCOUNTER — Other Ambulatory Visit: Payer: Self-pay

## 2018-11-30 DIAGNOSIS — I10 Essential (primary) hypertension: Secondary | ICD-10-CM

## 2018-11-30 MED ORDER — LISINOPRIL-HYDROCHLOROTHIAZIDE 20-12.5 MG PO TABS: 2.0000 | ORAL_TABLET | Freq: Every day | ORAL | 3 refills | Status: DC

## 2018-12-03 ENCOUNTER — Encounter: Payer: Self-pay | Admitting: Nurse Practitioner

## 2018-12-28 ENCOUNTER — Encounter: Payer: Self-pay | Admitting: Adult Health

## 2018-12-28 ENCOUNTER — Ambulatory Visit: Payer: BLUE CROSS/BLUE SHIELD | Admitting: Adult Health

## 2018-12-28 ENCOUNTER — Other Ambulatory Visit: Payer: Self-pay

## 2018-12-28 DIAGNOSIS — I10 Essential (primary) hypertension: Secondary | ICD-10-CM

## 2018-12-28 DIAGNOSIS — M549 Dorsalgia, unspecified: Secondary | ICD-10-CM

## 2018-12-28 DIAGNOSIS — F411 Generalized anxiety disorder: Secondary | ICD-10-CM

## 2018-12-28 MED ORDER — TRAMADOL HCL 50 MG PO TABS: 50.0000 mg | ORAL_TABLET | Freq: Four times a day (QID) | ORAL | 2 refills | Status: DC | PRN

## 2018-12-28 MED ORDER — LISINOPRIL-HYDROCHLOROTHIAZIDE 20-12.5 MG PO TABS: 2.0000 | ORAL_TABLET | Freq: Every day | ORAL | 3 refills | Status: DC

## 2018-12-28 MED ORDER — ALPRAZOLAM 0.25 MG PO TABS: 0.2500 mg | ORAL_TABLET | Freq: Every evening | ORAL | 2 refills | Status: DC | PRN

## 2018-12-28 NOTE — Progress Notes (Signed)
[facility] [place], [place] [address]  Internal MEDICINE  Telephone Visit  Patient Name: Joanne Maddox  [id number]  [id number]  Date of Service: 12/29/2018  I connected with the patient at 230 by telephone and verified the patients identity using two identifiers.   I discussed the limitations, risks, security and privacy concerns of performing an evaluation and management service by telephone and the availability of in person appointments. I also discussed with the patient that there may be a patient responsible charge related to the service.  The patient expressed understanding and agrees to proceed.    Chief Complaint  Patient presents with  . Telephone Screen  . Back Pain    tramadol refill ,   . Hypertension    medication refill   . Anxiety    medication refill   . Telephone Assessment    HPI  Pt reports she is in need of medicaiton refills.  She states that overall she has been doing well and denies any current acute issues.  She is requesting Tramadol for her chronic back pain, Lisinopril for her HTN and Xanax refills for her anxiety.  She reports taking her tramadol and xanax on average twice weekly.  Some weeks are worse than others.  Her blood pressure has been controlled, and she denies chest pain, SOB, palpitations or headaches.    Current Medication: Outpatient Encounter Medications as of 12/28/2018  Medication Sig Note  . ALPRAZolam (XANAX) 0.25 MG tablet Take 1 tablet (0.25 mg total) by mouth at bedtime as needed.   . ergocalciferol (DRISDOL) 50000 units capsule Take 1 capsule (50,000 Units total) by mouth once a week.   Marland Kitchen lisinopril-hydrochlorothiazide (PRINZIDE,ZESTORETIC) 20-12.5 MG tablet Take 2 tablets by mouth daily.   Marland Kitchen lovastatin (MEVACOR) 40 MG tablet Take 40 mg by mouth at bedtime.  01/05/2014: Received from: External Pharmacy  . omeprazole (PRILOSEC) 40 MG capsule Take 1 capsule (40 mg total) by mouth daily.   . SUMAtriptan  (IMITREX) 50 MG tablet Take 1 tablet (50 mg total) by mouth every 2 (two) hours as needed.   . traMADol (ULTRAM) 50 MG tablet Take 1 tablet (50 mg total) by mouth every 6 (six) hours as needed.   . [DISCONTINUED] ALPRAZolam (XANAX) 0.25 MG tablet Take 1 tablet (0.25 mg total) by mouth at bedtime as needed.   . [DISCONTINUED] lisinopril-hydrochlorothiazide (PRINZIDE,ZESTORETIC) 20-12.5 MG tablet Take 2 tablets by mouth daily.   . [DISCONTINUED] traMADol (ULTRAM) 50 MG tablet Take 1 tablet (50 mg total) by mouth every 6 (six) hours as needed.   . lubiprostone (AMITIZA) 24 MCG capsule Take 24 mcg by mouth as needed for constipation.   . medroxyPROGESTERone (PROVERA) 10 MG tablet Take 1 tablet (10 mg total) by mouth daily. (Patient not taking: Reported on 09/17/2017)   . [DISCONTINUED] amoxicillin (AMOXIL) 875 MG tablet Take 1 tablet (875 mg total) by mouth 2 (two) times daily. (Patient not taking: Reported on 12/28/2018)   . [DISCONTINUED] azithromycin (ZITHROMAX) 250 MG tablet z-pack - take as directed for 5 days (Patient not taking: Reported on 12/28/2018)   . [DISCONTINUED] Fe Cbn-Fe Gluc-FA-B12-C-DSS (FERRALET 90) 90-1 MG TABS Take 1 tablet by mouth daily. (Patient not taking: Reported on 12/28/2018)   . [DISCONTINUED] methylPREDNISolone (MEDROL) 4 MG TBPK tablet Take by mouth as directed for 6 days (Patient not taking: Reported on 12/28/2018)    No facility-administered encounter medications on file as of 12/28/2018.     Surgical History: Past Surgical  History:  Procedure Laterality Date  . CESAREAN SECTION  1989  . COLONOSCOPY  2010   Dr Vira Agar  . KNEE SURGERY Right 1999    Medical History: Past Medical History:  Diagnosis Date  . Anxiety   . Colon polyp 2010  . GERD (gastroesophageal reflux disease)   . Hypercholesteremia   . Hyperlipidemia   . Hypertension   . Migraine     Family History: History reviewed. No pertinent family history.  Social History   Socioeconomic History  .  Marital status: Married    Spouse name: Not on file  . Number of children: Not on file  . Years of education: Not on file  . Highest education level: Not on file  Occupational History  . Not on file  Social Needs  . Financial resource strain: Not on file  . Food insecurity:    Worry: Not on file    Inability: Not on file  . Transportation needs:    Medical: Not on file    Non-medical: Not on file  Tobacco Use  . Smoking status: Never Smoker  . Smokeless tobacco: Never Used  Substance and Sexual Activity  . Alcohol use: Yes    Comment: occasionally  . Drug use: No  . Sexual activity: Yes    Birth control/protection: None  Lifestyle  . Physical activity:    Days per week: Not on file    Minutes per session: Not on file  . Stress: Not on file  Relationships  . Social connections:    Talks on phone: Not on file    Gets together: Not on file    Attends religious service: Not on file    Active member of club or organization: Not on file    Attends meetings of clubs or organizations: Not on file    Relationship status: Not on file  . Intimate partner violence:    Fear of current or ex partner: Not on file    Emotionally abused: Not on file    Physically abused: Not on file    Forced sexual activity: Not on file  Other Topics Concern  . Not on file  Social History Narrative  . Not on file      Review of Systems  Constitutional: Negative for chills, fatigue and unexpected weight change.  HENT: Negative for congestion, rhinorrhea, sneezing and sore throat.   Eyes: Negative for photophobia, pain and redness.  Respiratory: Negative for cough, chest tightness and shortness of breath.   Cardiovascular: Negative for chest pain and palpitations.  Gastrointestinal: Negative for abdominal pain, constipation, diarrhea, nausea and vomiting.  Endocrine: Negative.   Genitourinary: Negative for dysuria and frequency.  Musculoskeletal: Negative for arthralgias, back pain, joint  swelling and neck pain.  Skin: Negative for rash.  Allergic/Immunologic: Negative.   Neurological: Negative for tremors and numbness.  Hematological: Negative for adenopathy. Does not bruise/bleed easily.  Psychiatric/Behavioral: Negative for behavioral problems and sleep disturbance. The patient is not nervous/anxious.     Vital Signs: Resp 16   Ht 4\' 9"  (1.448 m)   Wt 157 lb (71.2 kg)   BMI 33.97 kg/m    Observation/Objective:  Well appearing.  NAD noted.    Assessment/Plan: 1. Essential hypertension BP stable per patient.  Continue present management.  - lisinopril-hydrochlorothiazide (PRINZIDE,ZESTORETIC) 20-12.5 MG tablet; Take 2 tablets by mouth daily.  Dispense: 60 tablet; Refill: 3  2. Generalized anxiety disorder Reviewed risks and possible side effects associated with taking opiates, benzodiazepines and other  CNS depressants. Combination of these could cause dizziness and drowsiness. Advised patient not to drive or operate machinery when taking these medications, as patient's and other's life can be at risk and will have consequences. Patient verbalized understanding in this matter. Dependence and abuse for these drugs will be monitored closely. A Controlled substance policy and procedure is on file which allows [facility] to order a urine drug screen test at any visit. Patient understands and agrees with the plan - ALPRAZolam (XANAX) 0.25 MG tablet; Take 1 tablet (0.25 mg total) by mouth at bedtime as needed.  Dispense: 30 tablet; Refill: 2  3. Dorsalgia, unspecified Reviewed risks and possible side effects associated with taking opiates, benzodiazepines and other CNS depressants. Combination of these could cause dizziness and drowsiness. Advised patient not to drive or operate machinery when taking these medications, as patient's and other's life can be at risk and will have consequences. Patient verbalized understanding in this matter. Dependence and abuse for  these drugs will be monitored closely. A Controlled substance policy and procedure is on file which allows [facility] to order a urine drug screen test at any visit. Patient understands and agrees with the plan - traMADol (ULTRAM) 50 MG tablet; Take 1 tablet (50 mg total) by mouth every 6 (six) hours as needed.  Dispense: 30 tablet; Refill: 2  General Counseling: joyous gleghorn understanding of the findings of today's phone visit and agrees with plan of treatment. I have discussed any further diagnostic evaluation that may be needed or ordered today. We also reviewed her medications today. she has been encouraged to call the office with any questions or concerns that should arise related to todays visit.    No orders of the defined types were placed in this encounter.   Meds ordered this encounter  Medications  . traMADol (ULTRAM) 50 MG tablet    Sig: Take 1 tablet (50 mg total) by mouth every 6 (six) hours as needed.    Dispense:  30 tablet    Refill:  2  . lisinopril-hydrochlorothiazide (PRINZIDE,ZESTORETIC) 20-12.5 MG tablet    Sig: Take 2 tablets by mouth daily.    Dispense:  60 tablet    Refill:  3  . ALPRAZolam (XANAX) 0.25 MG tablet    Sig: Take 1 tablet (0.25 mg total) by mouth at bedtime as needed.    Dispense:  30 tablet    Refill:  2    Time spent: [place] AGNP-C Internal medicine

## 2018-12-29 ENCOUNTER — Encounter: Payer: Self-pay | Admitting: Adult Health

## 2018-12-29 NOTE — Patient Instructions (Signed)

## 2019-01-04 ENCOUNTER — Other Ambulatory Visit: Payer: Self-pay

## 2019-01-04 DIAGNOSIS — E559 Vitamin D deficiency, unspecified: Secondary | ICD-10-CM

## 2019-01-04 MED ORDER — ERGOCALCIFEROL 1.25 MG (50000 UT) PO CAPS: 50000.0000 [IU] | ORAL_CAPSULE | ORAL | 0 refills | Status: DC

## 2019-02-11 ENCOUNTER — Ambulatory Visit: Payer: BC Managed Care – PPO | Admitting: Nurse Practitioner

## 2019-03-25 DIAGNOSIS — M1611 Unilateral primary osteoarthritis, right hip: Secondary | ICD-10-CM | POA: Diagnosis not present

## 2019-03-31 ENCOUNTER — Other Ambulatory Visit: Payer: Self-pay | Admitting: Adult Health

## 2019-03-31 DIAGNOSIS — M549 Dorsalgia, unspecified: Secondary | ICD-10-CM

## 2019-04-02 DIAGNOSIS — M5416 Radiculopathy, lumbar region: Secondary | ICD-10-CM | POA: Diagnosis not present

## 2019-04-02 DIAGNOSIS — M1611 Unilateral primary osteoarthritis, right hip: Secondary | ICD-10-CM | POA: Diagnosis not present

## 2019-04-02 DIAGNOSIS — M7061 Trochanteric bursitis, right hip: Secondary | ICD-10-CM | POA: Diagnosis not present

## 2019-04-23 ENCOUNTER — Telehealth: Payer: Self-pay

## 2019-04-23 NOTE — Telephone Encounter (Signed)
[facility] - [address] [place], [place], [place] [address]  [phone]

## 2019-04-23 NOTE — Telephone Encounter (Addendum)
Patient has seen [facility] for Fibroids and she is inquiring about referral to the [facility] Location to talk to a surgeon about getting it removed. She just wants the information to the Facility. She will let us know if she needs anything additional. [phone]

## 2019-05-14 ENCOUNTER — Ambulatory Visit: Payer: Self-pay | Admitting: Nurse Practitioner

## 2019-05-17 DIAGNOSIS — M5416 Radiculopathy, lumbar region: Secondary | ICD-10-CM | POA: Diagnosis not present

## 2019-05-17 DIAGNOSIS — M545 Low back pain: Secondary | ICD-10-CM | POA: Diagnosis not present

## 2019-05-17 DIAGNOSIS — I10 Essential (primary) hypertension: Secondary | ICD-10-CM | POA: Diagnosis not present

## 2019-05-17 DIAGNOSIS — M7061 Trochanteric bursitis, right hip: Secondary | ICD-10-CM | POA: Diagnosis not present

## 2019-05-17 DIAGNOSIS — R110 Nausea: Secondary | ICD-10-CM | POA: Diagnosis not present

## 2019-06-14 ENCOUNTER — Other Ambulatory Visit: Payer: Self-pay | Admitting: Adult Health

## 2019-06-14 DIAGNOSIS — F411 Generalized anxiety disorder: Secondary | ICD-10-CM

## 2019-07-15 ENCOUNTER — Encounter: Payer: Self-pay | Admitting: Nurse Practitioner

## 2019-07-15 ENCOUNTER — Ambulatory Visit: Payer: BC Managed Care – PPO | Admitting: Nurse Practitioner

## 2019-07-15 ENCOUNTER — Other Ambulatory Visit: Payer: Self-pay

## 2019-07-15 VITALS — BP 138/88 | HR 77 | Temp 97.4°F | Resp 16 | Ht <= 58 in | Wt 159.6 lb

## 2019-07-15 DIAGNOSIS — F411 Generalized anxiety disorder: Secondary | ICD-10-CM | POA: Diagnosis not present

## 2019-07-15 DIAGNOSIS — J0140 Acute pansinusitis, unspecified: Secondary | ICD-10-CM

## 2019-07-15 DIAGNOSIS — K581 Irritable bowel syndrome with constipation: Secondary | ICD-10-CM

## 2019-07-15 DIAGNOSIS — R10817 Generalized abdominal tenderness: Secondary | ICD-10-CM

## 2019-07-15 MED ORDER — ALPRAZOLAM 0.25 MG PO TABS: 0.2500 mg | ORAL_TABLET | Freq: Every evening | ORAL | 2 refills | Status: DC | PRN

## 2019-07-15 MED ORDER — AMOXICILLIN 875 MG PO TABS: 875.0000 mg | ORAL_TABLET | Freq: Two times a day (BID) | ORAL | 0 refills | Status: DC

## 2019-07-15 MED ORDER — LINACLOTIDE 72 MCG PO CAPS: 72.0000 ug | ORAL_CAPSULE | Freq: Every day | ORAL | 2 refills | Status: DC

## 2019-07-15 NOTE — Progress Notes (Signed)
[facility] [place], [place] [address]  Internal MEDICINE  Office Visit Note  Patient Name: Joanne Maddox  [id number]  [id number]  Date of Service: 08/01/2019   Pt is here for a sick visit.  Chief Complaint  Patient presents with  . Gas    has been like this all week, been taking antacids, feels bloated, slight headaches      The patient is here for acute visit. She is having a lot of bloating and excess gas for the past week. Has tried OTC Alka-Seltzer, Tums, Gas-X without any relief. States that this gets worse every time she eats. She has had some nausea but has been unable to throw up. States that she has been a little constipated. Last bowel movement was yesterday. Does not feel like she was able to evacuate her bowels completely.  The patient is also c/o nasal congestion and scratchy throat. Has had symptoms for a few days. Not getting better but not getting worse either. Afraid symptoms will continue to get worse over the weekend.        Current Medication:  Outpatient Encounter Medications as of 07/15/2019  Medication Sig Note  . ALPRAZolam (XANAX) 0.25 MG tablet Take 1 tablet (0.25 mg total) by mouth at bedtime as needed.   . ergocalciferol (DRISDOL) 1.25 MG (50000 UT) capsule Take 1 capsule (50,000 Units total) by mouth once a week.   Marland Kitchen lisinopril-hydrochlorothiazide (PRINZIDE,ZESTORETIC) 20-12.5 MG tablet Take 2 tablets by mouth daily.   Marland Kitchen lovastatin (MEVACOR) 40 MG tablet Take 40 mg by mouth at bedtime.  01/05/2014: Received from: External Pharmacy  . lubiprostone (AMITIZA) 24 MCG capsule Take 24 mcg by mouth as needed for constipation.   Marland Kitchen omeprazole (PRILOSEC) 40 MG capsule Take 1 capsule (40 mg total) by mouth daily.   . SUMAtriptan (IMITREX) 50 MG tablet Take 1 tablet (50 mg total) by mouth every 2 (two) hours as needed.   . traMADol (ULTRAM) 50 MG tablet Take 1 tablet (50 mg total) by mouth every 6 (six) hours as needed.   .  [DISCONTINUED] ALPRAZolam (XANAX) 0.25 MG tablet Take 1 tablet (0.25 mg total) by mouth at bedtime as needed.   Marland Kitchen amoxicillin (AMOXIL) 875 MG tablet Take 1 tablet (875 mg total) by mouth 2 (two) times daily.   Marland Kitchen linaclotide (LINZESS) 72 MCG capsule Take 1 capsule (72 mcg total) by mouth daily before breakfast.   . medroxyPROGESTERone (PROVERA) 10 MG tablet Take 1 tablet (10 mg total) by mouth daily. (Patient not taking: Reported on 09/17/2017)    No facility-administered encounter medications on file as of 07/15/2019.       Medical History: Past Medical History:  Diagnosis Date  . Anxiety   . Colon polyp 2010  . GERD (gastroesophageal reflux disease)   . Hypercholesteremia   . Hyperlipidemia   . Hypertension   . Migraine      Today's Vitals   07/15/19 1034  BP: 138/88  Pulse: 77  Resp: 16  Temp: (!) 97.4 F (36.3 C)  SpO2: 100%  Weight: 159 lb 9.6 oz (72.4 kg)  Height: 4\' 9"  (1.448 m)   Body mass index is 34.54 kg/m.  Review of Systems  Constitutional: Positive for appetite change and fatigue.  HENT: Positive for congestion.        Scratchy throat.   Respiratory: Negative for choking and shortness of breath.   Cardiovascular: Negative for chest pain and palpitations.  Gastrointestinal: Positive for abdominal distention,  abdominal pain and constipation.  Endocrine: Negative for cold intolerance, heat intolerance, polydipsia and polyuria.  Genitourinary: Negative for dysuria, enuresis, frequency and urgency.  Allergic/Immunologic: Positive for environmental allergies.  Neurological: Positive for headaches. Negative for dizziness.  Hematological: Positive for adenopathy.  Psychiatric/Behavioral: Positive for sleep disturbance. The patient is nervous/anxious.     Physical Exam Vitals signs and nursing note reviewed.  Constitutional:      General: She is not in acute distress.    Appearance: Normal appearance. She is well-developed. She is not diaphoretic.  HENT:      Head: Normocephalic and atraumatic.     Right Ear: Tympanic membrane is erythematous and bulging.     Left Ear: Tympanic membrane is erythematous and bulging.     Nose: Congestion present.     Right Turbinates: Enlarged and swollen.     Left Turbinates: Enlarged and swollen.     Right Sinus: Frontal sinus tenderness present.     Left Sinus: Frontal sinus tenderness present.     Mouth/Throat:     Pharynx: Posterior oropharyngeal erythema present. No oropharyngeal exudate.  Eyes:     Extraocular Movements: Extraocular movements intact.     Pupils: Pupils are equal, round, and reactive to light.  Neck:     Musculoskeletal: Normal range of motion and neck supple.     Thyroid: No thyromegaly.     Vascular: No JVD.     Trachea: No tracheal deviation.  Cardiovascular:     Rate and Rhythm: Normal rate and regular rhythm.     Heart sounds: Normal heart sounds. No murmur. No friction rub. No gallop.   Pulmonary:     Effort: Pulmonary effort is normal. No respiratory distress.     Breath sounds: Normal breath sounds. No wheezing or rales.  Chest:     Chest wall: No tenderness.  Abdominal:     General: Bowel sounds are normal.     Palpations: Abdomen is soft.     Tenderness: There is abdominal tenderness.  Musculoskeletal: Normal range of motion.  Lymphadenopathy:     Cervical: No cervical adenopathy.  Skin:    General: Skin is warm and dry.  Neurological:     Mental Status: She is alert and oriented to person, place, and time.     Cranial Nerves: No cranial nerve deficit.  Psychiatric:        Attention and Perception: Attention and perception normal.        Mood and Affect: Affect normal. Mood is anxious.        Speech: Speech normal.        Behavior: Behavior normal. Behavior is cooperative.        Thought Content: Thought content normal.        Cognition and Memory: Cognition and memory normal.        Judgment: Judgment normal.   Assessment/Plan: 1. Acute pansinusitis,  recurrence not specified Start amoxicillin 875mg  bid for 10 days. Rest and increase fluids. Take tylenol as needed and as indicated for headache and/or fever.  - amoxicillin (AMOXIL) 875 MG tablet; Take 1 tablet (875 mg total) by mouth 2 (two) times daily.  Dispense: 20 tablet; Refill: 0  2. Generalized abdominal tenderness without rebound tenderness Will get abdominal ultrasound for further evaluation.  - US Abdomen Complete; Future  3. Irritable bowel syndrome with constipation Start linzess 11mcg daily.  - linaclotide (LINZESS) 72 MCG capsule; Take 1 capsule (72 mcg total) by mouth daily before breakfast.  Dispense: 30  capsule; Refill: 2  4. Generalized anxiety disorder May take alprazolam 0.25mg  at bedtime as needed for anxiety/insomnia. New prescription provided to her pharmacy today.  - ALPRAZolam (XANAX) 0.25 MG tablet; Take 1 tablet (0.25 mg total) by mouth at bedtime as needed.  Dispense: 30 tablet; Refill: 2  General Counseling: Kailaya verbalizes understanding of the findings of todays visit and agrees with plan of treatment. I have discussed any further diagnostic evaluation that may be needed or ordered today. We also reviewed her medications today. she has been encouraged to call the office with any questions or concerns that should arise related to todays visit.    Counseling:  This patient was seen by Leretha Pol FNP Collaboration with Dr Lavera Guise as a part of collaborative care agreement  Orders Placed This Encounter  Procedures  . US Abdomen Complete    Meds ordered this encounter  Medications  . linaclotide (LINZESS) 72 MCG capsule    Sig: Take 1 capsule (72 mcg total) by mouth daily before breakfast.    Dispense:  30 capsule    Refill:  2    Samples provided in the office today    Order Specific Question:   Supervising Provider    Answer:   Lavera Guise [place]  . ALPRAZolam (XANAX) 0.25 MG tablet    Sig: Take 1 tablet (0.25 mg total) by mouth at  bedtime as needed.    Dispense:  30 tablet    Refill:  2    Order Specific Question:   Supervising Provider    Answer:   Lavera Guise [id number]  . amoxicillin (AMOXIL) 875 MG tablet    Sig: Take 1 tablet (875 mg total) by mouth 2 (two) times daily.    Dispense:  20 tablet    Refill:  0    Order Specific Question:   Supervising Provider    Answer:   Lavera Guise [id number]    Time spent: 25 Minutes

## 2019-07-29 DIAGNOSIS — F411 Generalized anxiety disorder: Secondary | ICD-10-CM | POA: Diagnosis not present

## 2019-07-29 DIAGNOSIS — K581 Irritable bowel syndrome with constipation: Secondary | ICD-10-CM | POA: Diagnosis not present

## 2019-07-29 DIAGNOSIS — R140 Abdominal distension (gaseous): Secondary | ICD-10-CM | POA: Diagnosis not present

## 2019-07-29 DIAGNOSIS — R143 Flatulence: Secondary | ICD-10-CM | POA: Diagnosis not present

## 2019-08-01 DIAGNOSIS — R10817 Generalized abdominal tenderness: Secondary | ICD-10-CM | POA: Insufficient documentation

## 2019-08-01 DIAGNOSIS — K581 Irritable bowel syndrome with constipation: Secondary | ICD-10-CM | POA: Insufficient documentation

## 2019-08-03 ENCOUNTER — Telehealth: Payer: Self-pay

## 2019-08-03 NOTE — Telephone Encounter (Signed)
Left message and asked pt to call back regarding benefits for ultrasound. Joanne Maddox

## 2019-08-08 ENCOUNTER — Other Ambulatory Visit: Payer: Self-pay | Admitting: Adult Health

## 2019-08-08 DIAGNOSIS — I10 Essential (primary) hypertension: Secondary | ICD-10-CM

## 2019-08-13 ENCOUNTER — Other Ambulatory Visit: Payer: BC Managed Care – PPO

## 2019-08-24 ENCOUNTER — Other Ambulatory Visit: Payer: Self-pay | Admitting: Internal Medicine

## 2019-08-24 DIAGNOSIS — Z20822 Contact with and (suspected) exposure to covid-19: Secondary | ICD-10-CM

## 2019-08-25 ENCOUNTER — Telehealth: Payer: Self-pay

## 2019-08-25 ENCOUNTER — Other Ambulatory Visit: Payer: Self-pay

## 2019-08-25 DIAGNOSIS — G43119 Migraine with aura, intractable, without status migrainosus: Secondary | ICD-10-CM

## 2019-08-25 MED ORDER — SUMATRIPTAN SUCCINATE 50 MG PO TABS: 50.0000 mg | ORAL_TABLET | ORAL | 5 refills | Status: DC | PRN

## 2019-08-25 NOTE — Telephone Encounter (Signed)
Patient cancelled appointment on 08/26/2019 will call back to reschedule appointment. klh

## 2019-08-25 NOTE — Telephone Encounter (Signed)
Called lmom informing patient of appointment. klh 

## 2019-08-26 ENCOUNTER — Ambulatory Visit: Payer: BC Managed Care – PPO | Admitting: Nurse Practitioner

## 2019-08-26 LAB — NOVEL CORONAVIRUS, NAA: SARS-CoV-2, NAA: NOT DETECTED

## 2019-09-02 ENCOUNTER — Other Ambulatory Visit: Payer: Self-pay

## 2019-09-02 DIAGNOSIS — Z20822 Contact with and (suspected) exposure to covid-19: Secondary | ICD-10-CM

## 2019-09-04 LAB — NOVEL CORONAVIRUS, NAA: SARS-CoV-2, NAA: NOT DETECTED

## 2019-09-09 DIAGNOSIS — F411 Generalized anxiety disorder: Secondary | ICD-10-CM | POA: Diagnosis not present

## 2019-09-09 DIAGNOSIS — R140 Abdominal distension (gaseous): Secondary | ICD-10-CM | POA: Diagnosis not present

## 2019-09-09 DIAGNOSIS — R143 Flatulence: Secondary | ICD-10-CM | POA: Diagnosis not present

## 2019-09-09 DIAGNOSIS — K581 Irritable bowel syndrome with constipation: Secondary | ICD-10-CM | POA: Diagnosis not present

## 2019-09-26 ENCOUNTER — Other Ambulatory Visit: Payer: Self-pay | Admitting: Adult Health

## 2019-09-26 DIAGNOSIS — E559 Vitamin D deficiency, unspecified: Secondary | ICD-10-CM

## 2019-10-21 ENCOUNTER — Ambulatory Visit: Payer: BC Managed Care – PPO | Admitting: Nurse Practitioner

## 2019-10-22 ENCOUNTER — Encounter: Payer: Self-pay | Admitting: Nurse Practitioner

## 2019-10-22 ENCOUNTER — Ambulatory Visit: Payer: BC Managed Care – PPO | Admitting: Nurse Practitioner

## 2019-10-22 VITALS — Ht <= 58 in | Wt 164.0 lb

## 2019-10-22 DIAGNOSIS — D508 Other iron deficiency anemias: Secondary | ICD-10-CM

## 2019-10-22 DIAGNOSIS — E559 Vitamin D deficiency, unspecified: Secondary | ICD-10-CM

## 2019-10-22 DIAGNOSIS — R7301 Impaired fasting glucose: Secondary | ICD-10-CM | POA: Diagnosis not present

## 2019-10-22 DIAGNOSIS — R5383 Other fatigue: Secondary | ICD-10-CM | POA: Diagnosis not present

## 2019-10-22 DIAGNOSIS — R635 Abnormal weight gain: Secondary | ICD-10-CM

## 2019-10-22 NOTE — Progress Notes (Signed)
[facility] [place], [place] [address]  Internal MEDICINE  Telephone Visit  Patient Name: Joanne Maddox  [id number]  [id number]  Date of Service: 10/24/2019  I connected with the patient at 3:54pm by webcam and verified the patients identity using two identifiers.   I discussed the limitations, risks, security and privacy concerns of performing an evaluation and management service by webcam and the availability of in person appointments. I also discussed with the patient that there may be a patient responsible charge related to the service.  The patient expressed understanding and agrees to proceed.    Chief Complaint  Patient presents with  . Telephone Assessment  . Telephone Screen  . Fatigue    feels like she is also retaining fluid   . Headache    The patient has been contacted via webcam for follow up visit due to concerns for spread of novel coronavirus. The patient presents for acute visit. Today, she states that she is extremely tired. Has to push herself all day to keep working. Feels like she peps up a little after lunch. Doesn't take long, and she is fatigued again. She also states that she has gained weight. She states that she normally weighs 155 pounds and has been weighing 164, sometimes 167. She does not feel as though her blood pressure is elevated. She states that she has had intermittent headaches. Feels like these may be like sinus headaches. Not like her typical migraines. She does not get nauseated and is not sensitive to light.       Current Medication: Outpatient Encounter Medications as of 10/22/2019  Medication Sig Note  . ALPRAZolam (XANAX) 0.25 MG tablet Take 1 tablet (0.25 mg total) by mouth at bedtime as needed.   Marland Kitchen amoxicillin (AMOXIL) 875 MG tablet Take 1 tablet (875 mg total) by mouth 2 (two) times daily.   Marland Kitchen linaclotide (LINZESS) 72 MCG capsule Take 1 capsule (72 mcg total) by mouth daily before breakfast.   .  lisinopril-hydrochlorothiazide (ZESTORETIC) 20-12.5 MG tablet TAKE 2 TABLETS BY MOUTH EVERY DAY   . lovastatin (MEVACOR) 40 MG tablet Take 40 mg by mouth at bedtime.  01/05/2014: Received from: External Pharmacy  . lubiprostone (AMITIZA) 24 MCG capsule Take 24 mcg by mouth as needed for constipation.   Marland Kitchen omeprazole (PRILOSEC) 40 MG capsule Take 1 capsule (40 mg total) by mouth daily.   . SUMAtriptan (IMITREX) 50 MG tablet Take 1 tablet (50 mg total) by mouth every 2 (two) hours as needed.   . traMADol (ULTRAM) 50 MG tablet Take 1 tablet (50 mg total) by mouth every 6 (six) hours as needed.   . Vitamin D, Ergocalciferol, (DRISDOL) 1.25 MG (50000 UT) CAPS capsule TAKE 1 CAPSULE BY MOUTH ONE TIME PER WEEK   . medroxyPROGESTERone (PROVERA) 10 MG tablet Take 1 tablet (10 mg total) by mouth daily. (Patient not taking: Reported on 09/17/2017)    No facility-administered encounter medications on file as of 10/22/2019.    Surgical History: Past Surgical History:  Procedure Laterality Date  . CESAREAN SECTION  1989  . COLONOSCOPY  2010   Dr Vira Agar  . KNEE SURGERY Right 1999    Medical History: Past Medical History:  Diagnosis Date  . Anxiety   . Colon polyp 2010  . GERD (gastroesophageal reflux disease)   . Hypercholesteremia   . Hyperlipidemia   . Hypertension   . Migraine     Family History: History reviewed. No pertinent family history.  Social History   Socioeconomic History  . Marital status: Married    Spouse name: Not on file  . Number of children: Not on file  . Years of education: Not on file  . Highest education level: Not on file  Occupational History  . Not on file  Tobacco Use  . Smoking status: Never Smoker  . Smokeless tobacco: Never Used  Substance and Sexual Activity  . Alcohol use: Yes    Comment: occasionally  . Drug use: No  . Sexual activity: Yes    Birth control/protection: None  Other Topics Concern  . Not on file  Social History Narrative  . Not  on file   Social Determinants of Health   Financial Resource Strain:   . Difficulty of Paying Living Expenses: Not on file  Food Insecurity:   . Worried About Charity fundraiser in the Last Year: Not on file  . Ran Out of Food in the Last Year: Not on file  Transportation Needs:   . Lack of Transportation (Medical): Not on file  . Lack of Transportation (Non-Medical): Not on file  Physical Activity:   . Days of Exercise per Week: Not on file  . Minutes of Exercise per Session: Not on file  Stress:   . Feeling of Stress : Not on file  Social Connections:   . Frequency of Communication with Friends and Family: Not on file  . Frequency of Social Gatherings with Friends and Family: Not on file  . Attends Religious Services: Not on file  . Active Member of Clubs or Organizations: Not on file  . Attends Archivist Meetings: Not on file  . Marital Status: Not on file  Intimate Partner Violence:   . Fear of Current or Ex-Partner: Not on file  . Emotionally Abused: Not on file  . Physically Abused: Not on file  . Sexually Abused: Not on file      Review of Systems  Constitutional: Positive for fatigue and unexpected weight change. Negative for chills.       Unintentional weight gain of nearly ten pounds  HENT: Negative for congestion, postnasal drip, rhinorrhea, sneezing and sore throat.   Respiratory: Negative for cough, chest tightness and shortness of breath.   Cardiovascular: Negative for chest pain and palpitations.  Gastrointestinal: Negative for abdominal pain, constipation, diarrhea, nausea and vomiting.  Endocrine: Negative for cold intolerance, heat intolerance, polydipsia and polyuria.  Musculoskeletal: Positive for arthralgias and myalgias. Negative for back pain, joint swelling and neck pain.  Skin: Negative for rash.  Allergic/Immunologic: Negative for environmental allergies.  Neurological: Positive for headaches. Negative for dizziness, tremors and  numbness.  Hematological: Negative for adenopathy. Does not bruise/bleed easily.  Psychiatric/Behavioral: Negative for behavioral problems (Depression), sleep disturbance and suicidal ideas. The patient is nervous/anxious.     Vital Signs: Ht 4\' 9"  (1.448 m)   Wt 164 lb (74.4 kg)   BMI 35.49 kg/m    Observation/Objective:   The patient is alert and oriented. She is pleasant and answers all questions appropriately. Breathing is non-labored. She is in no acute distress at this time. The patient sounds worried and fatigued on the phone throughout the visit.    Assessment/Plan: 1. Other fatigue Labs to be checked. Will discuss results with patient when they are available.  - CBC With Differential; Future - Comprehensive metabolic panel; Future - TSH + free T4; Future - Iron, TIBC and Ferritin Panel; Future - Vitamin B12; Future - Hemoglobin A1c; Future -  Hemoglobin A1c - Vitamin B12 - Iron, TIBC and Ferritin Panel - TSH + free T4 - Comprehensive metabolic panel - CBC With Differential  2. Other iron deficiency anemia Check anemia panel. - CBC With Differential; Future - Iron, TIBC and Ferritin Panel; Future - Vitamin B12; Future - Vitamin B12 - Iron, TIBC and Ferritin Panel - CBC With Differential  3. Impaired fasting glucose Bmp with HgbA1c to be checked.  - Hemoglobin A1c; Future - Hemoglobin A1c  4. Abnormal weight gain Check thyroid panel.  - TSH + free T4; Future - TSH + free T4  5. Vitamin D deficiency Check vitamin d level. - Vitamin D 1,25 dihydroxy; Future - Vitamin D 1,25 dihydroxy  General Counseling: Frady verbalizes understanding of the findings of today's phone visit and agrees with plan of treatment. I have discussed any further diagnostic evaluation that may be needed or ordered today. We also reviewed her medications today. she has been encouraged to call the office with any questions or concerns that should arise related to todays  visit.    Orders Placed This Encounter  Procedures  . CBC With Differential  . Comprehensive metabolic panel  . TSH + free T4  . Iron, TIBC and Ferritin Panel  . Vitamin B12  . Vitamin D 1,25 dihydroxy  . Hemoglobin A1c    This patient was seen by Leretha Pol FNP Collaboration with Dr Lavera Guise as a part of collaborative care agreement  Time spent: 63 Minutes    Dr Lavera Guise Internal medicine

## 2019-10-24 DIAGNOSIS — R7301 Impaired fasting glucose: Secondary | ICD-10-CM | POA: Insufficient documentation

## 2019-10-24 DIAGNOSIS — R635 Abnormal weight gain: Secondary | ICD-10-CM | POA: Insufficient documentation

## 2019-10-28 DIAGNOSIS — R5383 Other fatigue: Secondary | ICD-10-CM | POA: Diagnosis not present

## 2019-10-28 DIAGNOSIS — R635 Abnormal weight gain: Secondary | ICD-10-CM | POA: Diagnosis not present

## 2019-10-28 DIAGNOSIS — D508 Other iron deficiency anemias: Secondary | ICD-10-CM | POA: Diagnosis not present

## 2019-10-28 DIAGNOSIS — R7301 Impaired fasting glucose: Secondary | ICD-10-CM | POA: Diagnosis not present

## 2019-10-28 DIAGNOSIS — E559 Vitamin D deficiency, unspecified: Secondary | ICD-10-CM | POA: Diagnosis not present

## 2019-11-02 ENCOUNTER — Encounter: Payer: Self-pay | Admitting: Nurse Practitioner

## 2019-11-02 ENCOUNTER — Ambulatory Visit: Payer: BC Managed Care – PPO | Admitting: Nurse Practitioner

## 2019-11-02 VITALS — Temp 95.9°F | Resp 16 | Ht <= 58 in | Wt 162.0 lb

## 2019-11-02 DIAGNOSIS — D259 Leiomyoma of uterus, unspecified: Secondary | ICD-10-CM | POA: Diagnosis not present

## 2019-11-02 DIAGNOSIS — R5383 Other fatigue: Secondary | ICD-10-CM

## 2019-11-02 DIAGNOSIS — Z1231 Encounter for screening mammogram for malignant neoplasm of breast: Secondary | ICD-10-CM | POA: Diagnosis not present

## 2019-11-02 DIAGNOSIS — D509 Iron deficiency anemia, unspecified: Secondary | ICD-10-CM

## 2019-11-02 NOTE — Progress Notes (Signed)
[facility] [place], [place] [address]  Internal MEDICINE  Telephone Visit  Patient Name: Joanne Maddox  [id number]  [id number]  Date of Service: 11/03/2019  I connected with the patient at 5:08pm by webcam and verified the patients identity using two identifiers.   I discussed the limitations, risks, security and privacy concerns of performing an evaluation and management service by webcam and the availability of in person appointments. I also discussed with the patient that there may be a patient responsible charge related to the service.  The patient expressed understanding and agrees to proceed.    Chief Complaint  Patient presents with  . Telephone Assessment  . Telephone Screen  . Follow-up    review labs  . Gastroesophageal Reflux  . Hyperlipidemia  . Hypertension  . Anxiety    The patient has been contacted via webcam for follow up visit due to concerns for spread of novel coronavirus. She continues to be extremely tired. Has to push herself all day to keep working. Feels like she peps up a little after lunch. Doesn't take long, and she is fatigued again.  She states that she has had intermittent headaches. Recently had menstrua l cycle. States that it was very heavy and lasted longer than usual. She had labs done. She is mildly anemic with low ferritin level. This has been issue in the past. Has had visit with GYN due to fibroid tumors. She has been unable to follow up due to insurance and financial issues.       Current Medication: Outpatient Encounter Medications as of 11/02/2019  Medication Sig Note  . ALPRAZolam (XANAX) 0.25 MG tablet Take 1 tablet (0.25 mg total) by mouth at bedtime as needed.   Marland Kitchen amoxicillin (AMOXIL) 875 MG tablet Take 1 tablet (875 mg total) by mouth 2 (two) times daily.   Marland Kitchen linaclotide (LINZESS) 72 MCG capsule Take 1 capsule (72 mcg total) by mouth daily before breakfast.   . lisinopril-hydrochlorothiazide (ZESTORETIC)  20-12.5 MG tablet TAKE 2 TABLETS BY MOUTH EVERY DAY   . lovastatin (MEVACOR) 40 MG tablet Take 40 mg by mouth at bedtime.  01/05/2014: Received from: External Pharmacy  . lubiprostone (AMITIZA) 24 MCG capsule Take 24 mcg by mouth as needed for constipation.   Marland Kitchen omeprazole (PRILOSEC) 40 MG capsule Take 1 capsule (40 mg total) by mouth daily.   . SUMAtriptan (IMITREX) 50 MG tablet Take 1 tablet (50 mg total) by mouth every 2 (two) hours as needed.   . traMADol (ULTRAM) 50 MG tablet Take 1 tablet (50 mg total) by mouth every 6 (six) hours as needed.   . Vitamin D, Ergocalciferol, (DRISDOL) 1.25 MG (50000 UT) CAPS capsule TAKE 1 CAPSULE BY MOUTH ONE TIME PER WEEK   . medroxyPROGESTERone (PROVERA) 10 MG tablet Take 1 tablet (10 mg total) by mouth daily. (Patient not taking: Reported on 09/17/2017)   . [DISCONTINUED] Fe Cbn-Fe Gluc-FA-B12-C-DSS (FERRALET 90) 90-1 MG TABS Take 1 tablet by mouth daily.    No facility-administered encounter medications on file as of 11/02/2019.    Surgical History: Past Surgical History:  Procedure Laterality Date  . CESAREAN SECTION  1989  . COLONOSCOPY  2010   Dr Vira Agar  . KNEE SURGERY Right 1999    Medical History: Past Medical History:  Diagnosis Date  . Anxiety   . Colon polyp 2010  . GERD (gastroesophageal reflux disease)   . Hypercholesteremia   . Hyperlipidemia   . Hypertension   .  Migraine     Family History: History reviewed. No pertinent family history.  Social History   Socioeconomic History  . Marital status: Married    Spouse name: Not on file  . Number of children: Not on file  . Years of education: Not on file  . Highest education level: Not on file  Occupational History  . Not on file  Tobacco Use  . Smoking status: Never Smoker  . Smokeless tobacco: Never Used  Substance and Sexual Activity  . Alcohol use: Yes    Comment: occasionally  . Drug use: No  . Sexual activity: Yes    Birth control/protection: None  Other Topics  Concern  . Not on file  Social History Narrative  . Not on file   Social Determinants of Health   Financial Resource Strain:   . Difficulty of Paying Living Expenses: Not on file  Food Insecurity:   . Worried About Charity fundraiser in the Last Year: Not on file  . Ran Out of Food in the Last Year: Not on file  Transportation Needs:   . Lack of Transportation (Medical): Not on file  . Lack of Transportation (Non-Medical): Not on file  Physical Activity:   . Days of Exercise per Week: Not on file  . Minutes of Exercise per Session: Not on file  Stress:   . Feeling of Stress : Not on file  Social Connections:   . Frequency of Communication with Friends and Family: Not on file  . Frequency of Social Gatherings with Friends and Family: Not on file  . Attends Religious Services: Not on file  . Active Member of Clubs or Organizations: Not on file  . Attends Archivist Meetings: Not on file  . Marital Status: Not on file  Intimate Partner Violence:   . Fear of Current or Ex-Partner: Not on file  . Emotionally Abused: Not on file  . Physically Abused: Not on file  . Sexually Abused: Not on file      Review of Systems  Constitutional: Positive for fatigue and unexpected weight change. Negative for chills.  HENT: Negative for congestion, postnasal drip, rhinorrhea, sneezing and sore throat.   Respiratory: Negative for cough, chest tightness and shortness of breath.   Cardiovascular: Negative for chest pain and palpitations.  Gastrointestinal: Negative for abdominal pain, constipation, diarrhea, nausea and vomiting.  Endocrine: Negative for cold intolerance, heat intolerance, polydipsia and polyuria.  Genitourinary: Positive for menstrual problem.  Musculoskeletal: Positive for arthralgias and myalgias. Negative for back pain, joint swelling and neck pain.  Skin: Negative for rash.  Allergic/Immunologic: Negative for environmental allergies.  Neurological: Positive for  headaches. Negative for dizziness, tremors and numbness.  Hematological: Negative for adenopathy. Does not bruise/bleed easily.  Psychiatric/Behavioral: Negative for behavioral problems (Depression), sleep disturbance and suicidal ideas. The patient is nervous/anxious.     Today's Vitals   11/02/19 1558  Resp: 16  Temp: (!) 95.9 F (35.5 C)  Weight: 162 lb (73.5 kg)  Height: 4\' 9"  (1.448 m)   Body mass index is 35.06 kg/m.  Observation/Objective:   The patient is alert and oriented. She is pleasant and answers all questions appropriately. Breathing is non-labored. She is in no acute distress at this time.    Assessment/Plan:  1. Iron deficiency anemia, unspecified iron deficiency anemia type Reviewed labs with patient. She is mildly anemic with low ferritin levels. Will start iron supplement.   2. Uterine leiomyoma, unspecified location Likely causing heavy menstrual bleeding  and mild anemia. Will discuss new referral to GYN when able to go.   3. Other fatigue Likely due to iron deficiency anemia. Start iron supplement. Monitor closely.   4. Encounter for screening mammogram for malignant neoplasm of breast - MM DIGITAL SCREENING BILATERAL; Future   General Counseling: arlisha cerbone understanding of the findings of today's phone visit and agrees with plan of treatment. I have discussed any further diagnostic evaluation that may be needed or ordered today. We also reviewed her medications today. she has been encouraged to call the office with any questions or concerns that should arise related to todays visit.  This patient was seen by Leretha Pol FNP Collaboration with Dr Lavera Guise as a part of collaborative care agreement  Orders Placed This Encounter  Procedures  . MM DIGITAL SCREENING BILATERAL    Meds ordered this encounter  Medications  . DISCONTD: Fe Cbn-Fe Gluc-FA-B12-C-DSS (FERRALET 90) 90-1 MG TABS    Sig: Take 1 tablet by mouth daily.    Dispense:   30 tablet    Refill:  5    Patient given manufacturer copay card to help with expense.    Order Specific Question:   Supervising Provider    Answer:   Lavera Guise [id number]    Time spent: 36 Minutes    Dr Lavera Guise Internal medicine

## 2019-11-03 ENCOUNTER — Telehealth: Payer: Self-pay

## 2019-11-03 ENCOUNTER — Telehealth: Payer: Self-pay | Admitting: Nurse Practitioner

## 2019-11-03 ENCOUNTER — Other Ambulatory Visit: Payer: Self-pay

## 2019-11-03 MED ORDER — FERRALET 90 90-1 MG PO TABS: 1.0000 | ORAL_TABLET | Freq: Every day | ORAL | 5 refills | Status: DC

## 2019-11-03 NOTE — Telephone Encounter (Signed)
Lmom pt call us back due to ferralet is discontinue she can take OTC slow fe Iron  tab once a day as per [employer]

## 2019-11-03 NOTE — Telephone Encounter (Signed)
Hey. Can you let the patient know that I sent in prescription for ferralet. Will leave copay cared for her up front to pick up. Thanks.

## 2019-11-03 NOTE — Telephone Encounter (Signed)
Lmom to pt that we send pres for ferralet to [facility] and copay card is ready for pickup

## 2019-11-04 LAB — CBC WITH DIFFERENTIAL
Basophils Absolute: 0 10*3/uL (ref 0.0–0.2)
Basos: 1 %
EOS (ABSOLUTE): 0.1 10*3/uL (ref 0.0–0.4)
Eos: 1 %
Hematocrit: 31.3 % — ABNORMAL LOW (ref 34.0–46.6)
Hemoglobin: 10.5 g/dL — ABNORMAL LOW (ref 11.1–15.9)
Immature Grans (Abs): 0 10*3/uL (ref 0.0–0.1)
Immature Granulocytes: 0 %
Lymphocytes Absolute: 2.1 10*3/uL (ref 0.7–3.1)
Lymphs: 26 %
MCH: 28.2 pg (ref 26.6–33.0)
MCHC: 33.5 g/dL (ref 31.5–35.7)
MCV: 84 fL (ref 79–97)
Monocytes Absolute: 0.4 10*3/uL (ref 0.1–0.9)
Monocytes: 5 %
Neutrophils Absolute: 5.6 10*3/uL (ref 1.4–7.0)
Neutrophils: 67 %
RBC: 3.72 x10E6/uL — ABNORMAL LOW (ref 3.77–5.28)
RDW: 14.4 % (ref 11.7–15.4)
WBC: 8.2 10*3/uL (ref 3.4–10.8)

## 2019-11-04 LAB — COMPREHENSIVE METABOLIC PANEL
ALT: 15 IU/L (ref 0–32)
AST: 18 IU/L (ref 0–40)
Albumin/Globulin Ratio: 1.3 (ref 1.2–2.2)
Albumin: 3.9 g/dL (ref 3.8–4.8)
Alkaline Phosphatase: 101 IU/L (ref 39–117)
BUN/Creatinine Ratio: 14 (ref 9–23)
BUN: 12 mg/dL (ref 6–24)
Bilirubin Total: <0.2 mg/dL (ref 0.0–1.2)
CO2: 25 mmol/L (ref 20–29)
Calcium: 8.9 mg/dL (ref 8.7–10.2)
Chloride: 101 mmol/L (ref 96–106)
Creatinine, Ser: 0.86 mg/dL (ref 0.57–1.00)
GFR calc Af Amer: 92 mL/min/{1.73_m2} (ref >59)
GFR calc non Af Amer: 80 mL/min/{1.73_m2} (ref >59)
Globulin, Total: 2.9 g/dL (ref 1.5–4.5)
Glucose: 73 mg/dL (ref 65–99)
Potassium: 3.6 mmol/L (ref 3.5–5.2)
Sodium: 139 mmol/L (ref 134–144)
Total Protein: 6.8 g/dL (ref 6.0–8.5)

## 2019-11-04 LAB — TSH+FREE T4
Free T4: 0.99 ng/dL (ref 0.82–1.77)
TSH: 0.92 u[IU]/mL (ref 0.450–4.500)

## 2019-11-04 LAB — IRON,TIBC AND FERRITIN PANEL
Ferritin: 19 ng/mL (ref 15–150)
Iron Saturation: 14 % — ABNORMAL LOW (ref 15–55)
Iron: 57 ug/dL (ref 27–159)
Total Iron Binding Capacity: 394 ug/dL (ref 250–450)
UIBC: 337 ug/dL (ref 131–425)

## 2019-11-04 LAB — HEMOGLOBIN A1C
Est. average glucose Bld gHb Est-mCnc: 111 mg/dL
Hgb A1c MFr Bld: 5.5 % (ref 4.8–5.6)

## 2019-11-04 LAB — VITAMIN B12: Vitamin B-12: 698 pg/mL (ref 232–1245)

## 2019-11-04 LAB — VITAMIN D 1,25 DIHYDROXY
Vitamin D 1, 25 (OH)2 Total: 43 pg/mL
Vitamin D2 1, 25 (OH)2: 27 pg/mL
Vitamin D3 1, 25 (OH)2: 16 pg/mL

## 2019-11-04 NOTE — Progress Notes (Signed)
Reviewed with patient during visit

## 2019-11-12 ENCOUNTER — Other Ambulatory Visit: Payer: Self-pay

## 2019-11-23 ENCOUNTER — Other Ambulatory Visit: Payer: Self-pay | Admitting: Adult Health

## 2019-11-23 DIAGNOSIS — E559 Vitamin D deficiency, unspecified: Secondary | ICD-10-CM

## 2019-12-01 ENCOUNTER — Telehealth: Payer: Self-pay

## 2019-12-01 ENCOUNTER — Other Ambulatory Visit: Payer: Self-pay | Admitting: Nurse Practitioner

## 2019-12-01 DIAGNOSIS — J0140 Acute pansinusitis, unspecified: Secondary | ICD-10-CM

## 2019-12-01 DIAGNOSIS — J300 Vasomotor rhinitis: Secondary | ICD-10-CM

## 2019-12-01 MED ORDER — FLUTICASONE PROPIONATE 50 MCG/ACT NA SUSP: 2.0000 | Freq: Every day | NASAL | 6 refills | Status: DC

## 2019-12-01 MED ORDER — AMOXICILLIN 875 MG PO TABS: 875.0000 mg | ORAL_TABLET | Freq: Two times a day (BID) | ORAL | 0 refills | Status: DC

## 2019-12-01 NOTE — Telephone Encounter (Signed)
Pt advised we send antibiotic and nasal spray to [facility]

## 2019-12-01 NOTE — Telephone Encounter (Signed)
Sent amoxicillin 875mg  twice daily for 10 days. Also added flonase nasal spray. She should use one spray in both nostrils daily. Use OTC medication to reduce symptoms, rest, and increase fluids. atient with complaint of sinusitis.

## 2019-12-01 NOTE — Progress Notes (Signed)
Sent amoxicillin 875mg  twice daily for 10 days. Also added flonase nasal spray. She should use one spray in both nostrils daily. Use OTC medication to reduce symptoms, rest, and increase fluids. atient with complaint of sinusitis.

## 2019-12-16 ENCOUNTER — Other Ambulatory Visit: Payer: Self-pay

## 2019-12-16 DIAGNOSIS — E559 Vitamin D deficiency, unspecified: Secondary | ICD-10-CM

## 2019-12-16 MED ORDER — VITAMIN D (ERGOCALCIFEROL) 1.25 MG (50000 UNIT) PO CAPS: ORAL_CAPSULE | ORAL | 3 refills | Status: DC

## 2019-12-30 ENCOUNTER — Telehealth: Payer: Self-pay

## 2019-12-30 NOTE — Telephone Encounter (Signed)
Confirmed and screened for 01-03-20 ov. 

## 2020-01-03 ENCOUNTER — Other Ambulatory Visit: Payer: Self-pay

## 2020-01-03 ENCOUNTER — Encounter: Payer: Self-pay | Admitting: Nurse Practitioner

## 2020-01-03 ENCOUNTER — Ambulatory Visit (INDEPENDENT_AMBULATORY_CARE_PROVIDER_SITE_OTHER): Payer: BC Managed Care – PPO | Admitting: Nurse Practitioner

## 2020-01-03 VITALS — BP 142/80 | HR 76 | Temp 97.4°F | Resp 16 | Ht <= 58 in | Wt 167.0 lb

## 2020-01-03 DIAGNOSIS — Z0001 Encounter for general adult medical examination with abnormal findings: Secondary | ICD-10-CM | POA: Diagnosis not present

## 2020-01-03 DIAGNOSIS — M549 Dorsalgia, unspecified: Secondary | ICD-10-CM | POA: Diagnosis not present

## 2020-01-03 DIAGNOSIS — D259 Leiomyoma of uterus, unspecified: Secondary | ICD-10-CM

## 2020-01-03 DIAGNOSIS — R300 Dysuria: Secondary | ICD-10-CM

## 2020-01-03 DIAGNOSIS — F411 Generalized anxiety disorder: Secondary | ICD-10-CM | POA: Diagnosis not present

## 2020-01-03 MED ORDER — DICLOFENAC EPOLAMINE 1.3 % EX PTCH: 1.0000 | MEDICATED_PATCH | Freq: Two times a day (BID) | CUTANEOUS | 1 refills | Status: DC

## 2020-01-03 MED ORDER — ALPRAZOLAM 0.25 MG PO TABS: 0.2500 mg | ORAL_TABLET | Freq: Two times a day (BID) | ORAL | 2 refills | Status: DC | PRN

## 2020-01-03 NOTE — Progress Notes (Signed)
[facility] [place], [place] [address]  Internal MEDICINE  Office Visit Note  Patient Name: Joanne Maddox  [id number]  [id number]  Date of Service: 01/12/2020   Pt is here for routine health maintenance examination   Chief Complaint  Patient presents with  . Annual Exam  . Hyperlipidemia  . Hypertension  . Anemia  . Menstrual Problem    lasy cycle did not bleed normally as she usually does, shorter and not as heavy as usual   . Breast Problem    breast tenderness even post cycle     The patient is here for routine health maintenance exam. Today, she is having trouble with weakness and tiredness of the right leg. 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 Has seen orthopedic provider in the past. Had two cortisone injections into the spine/hip. The first one helped. The second caused her to have severe weakness of the right leg making it difficult for her to walk. She does have low back pain , lower right side of the spine, just above the right hip.  She is also having some trouble with constipation. linzess has worked for her in the past.  Concerned about very short menstrual cycle. She does have history of fibroid tumor in uterus. Sees [facility] and plans to make appointment with them for her pap smear.  She does need to have mammogram. Has been ordered. Patient just needs to schedule this.   Current Medication: Outpatient Encounter Medications as of 01/03/2020  Medication Sig Note  . ALPRAZolam (XANAX) 0.25 MG tablet Take 1 tablet (0.25 mg total) by mouth 2 (two) times daily as needed.   . fluticasone (FLONASE) 50 MCG/ACT nasal spray Place 2 sprays into both nostrils daily.   Marland Kitchen linaclotide (LINZESS) 72 MCG capsule Take 1 capsule (72 mcg total) by mouth daily before breakfast.   . lisinopril-hydrochlorothiazide (ZESTORETIC) 20-12.5 MG tablet TAKE 2 TABLETS BY MOUTH EVERY DAY   . lovastatin (MEVACOR) 40 MG tablet Take 40 mg by mouth at  bedtime.  01/05/2014: Received from: External Pharmacy  . omeprazole (PRILOSEC) 40 MG capsule Take 1 capsule (40 mg total) by mouth daily.   . SUMAtriptan (IMITREX) 50 MG tablet Take 1 tablet (50 mg total) by mouth every 2 (two) hours as needed.   . traMADol (ULTRAM) 50 MG tablet Take 1 tablet (50 mg total) by mouth every 6 (six) hours as needed.   . Vitamin D, Ergocalciferol, (DRISDOL) 1.25 MG (50000 UNIT) CAPS capsule TAKE 1 CAPSULE BY MOUTH ONE TIME PER WEEK   . [DISCONTINUED] ALPRAZolam (XANAX) 0.25 MG tablet Take 1 tablet (0.25 mg total) by mouth at bedtime as needed.   . [DISCONTINUED] lubiprostone (AMITIZA) 24 MCG capsule Take 24 mcg by mouth as needed for constipation.   . diclofenac (FLECTOR) 1.3 % PTCH Place 1 patch onto the skin 2 (two) times daily.   . medroxyPROGESTERone (PROVERA) 10 MG tablet Take 1 tablet (10 mg total) by mouth daily. (Patient not taking: Reported on 09/17/2017)   . [DISCONTINUED] amoxicillin (AMOXIL) 875 MG tablet Take 1 tablet (875 mg total) by mouth 2 (two) times daily. (Patient not taking: Reported on 01/03/2020)    No facility-administered encounter medications on file as of 01/03/2020.    Surgical History: Past Surgical History:  Procedure Laterality Date  . CESAREAN SECTION  1989  . COLONOSCOPY  2010   Dr Joanne Maddox  . KNEE SURGERY Right 1999    Medical History:  Past Medical History:  Diagnosis Date  . Anxiety   . Colon polyp 2010  . GERD (gastroesophageal reflux disease)   . Hypercholesteremia   . Hyperlipidemia   . Hypertension   . Migraine     Family History: History reviewed. No pertinent family history.    Review of Systems  Constitutional: Positive for activity change and fatigue. Negative for chills and unexpected weight change.  HENT: Negative for congestion, postnasal drip, rhinorrhea, sneezing and sore throat.   Respiratory: Negative for cough, chest tightness, shortness of breath and wheezing.   Cardiovascular: Negative for chest  pain and palpitations.  Gastrointestinal: Negative for abdominal pain, constipation, diarrhea, nausea and vomiting.  Endocrine: Negative for cold intolerance, heat intolerance, polydipsia and polyuria.  Genitourinary: Positive for menstrual problem. Negative for dysuria, frequency and hematuria.       Heavy menstrual periods. History of fibroid tumors.   Musculoskeletal: Positive for back pain and myalgias. Negative for arthralgias, joint swelling and neck pain.       Weakness and tiredness of the right hip and leg.   Skin: Negative for rash.  Allergic/Immunologic: Positive for environmental allergies.  Neurological: Positive for headaches. Negative for dizziness, tremors and numbness.  Hematological: Negative for adenopathy. Does not bruise/bleed easily.  Psychiatric/Behavioral: Positive for dysphoric mood. Negative for behavioral problems (Depression), sleep disturbance and suicidal ideas. The patient is not nervous/anxious.     Today's Vitals   01/03/20 1515  BP: (!) 142/80  Pulse: 76  Resp: 16  Temp: (!) 97.4 F (36.3 C)  SpO2: 100%  Weight: 167 lb (75.8 kg)  Height: 4\' 9"  (1.448 m)   Body mass index is 36.14 kg/m.  Physical Exam Vitals and nursing note reviewed.  Constitutional:      General: She is not in acute distress.    Appearance: Normal appearance. She is well-developed. She is obese. She is not diaphoretic.  HENT:     Head: Normocephalic and atraumatic.     Nose: Nose normal.     Mouth/Throat:     Pharynx: No oropharyngeal exudate.  Eyes:     Pupils: Pupils are equal, round, and reactive to light.  Neck:     Thyroid: No thyromegaly.     Vascular: No carotid bruit or JVD.     Trachea: No tracheal deviation.  Cardiovascular:     Rate and Rhythm: Normal rate and regular rhythm.     Pulses: Normal pulses.     Heart sounds: Normal heart sounds. No murmur. No friction rub. No gallop.   Pulmonary:     Effort: Pulmonary effort is normal. No respiratory distress.      Breath sounds: Normal breath sounds. No wheezing or rales.  Chest:     Chest wall: No tenderness.     Breasts:        Right: Normal. No swelling, bleeding, inverted nipple, mass, nipple discharge, skin change or tenderness.        Left: Normal. No swelling, bleeding, inverted nipple, mass, nipple discharge, skin change or tenderness.  Abdominal:     General: Bowel sounds are normal.     Palpations: Abdomen is soft.     Tenderness: There is no abdominal tenderness.  Musculoskeletal:        General: Tenderness present. Normal range of motion.     Cervical back: Normal range of motion and neck supple.     Comments: Tenderness of the right hip with moderate palpation. Hurts when standing from seated position. No bony abnormalities  or deformities are noted at this time.   Lymphadenopathy:     Cervical: No cervical adenopathy.     Upper Body:     Right upper body: No axillary adenopathy.     Left upper body: No axillary adenopathy.  Skin:    General: Skin is warm and dry.  Neurological:     Mental Status: She is alert and oriented to person, place, and time.     Cranial Nerves: No cranial nerve deficit.  Psychiatric:        Mood and Affect: Mood normal.        Behavior: Behavior normal.        Thought Content: Thought content normal.        Judgment: Judgment normal.      LABS: Recent Results (from the past 2160 hour(s))  Hemoglobin A1c     Status: None   Collection Time: 10/28/19  3:34 PM  Result Value Ref Range   Hgb A1c MFr Bld 5.5 4.8 - 5.6 %    Comment:          Prediabetes: 5.7 - 6.4          Diabetes: >6.4          Glycemic control for adults with diabetes: <7.0    Est. average glucose Bld gHb Est-mCnc 111 mg/dL  Vitamin D 1,25 dihydroxy     Status: None   Collection Time: 10/28/19  3:34 PM  Result Value Ref Range   Vitamin D 1, 25 (OH)2 Total 43 pg/mL    Comment: Reference Range: Adults: 21 - 65    Vitamin D2 1, 25 (OH)2 27 pg/mL    Comment: This test was  developed and its performance characteristics determined by [facility]. It has not been cleared or approved by the Food and Drug Administration.    Vitamin D3 1, 25 (OH)2 16 pg/mL    Comment: This test was developed and its performance characteristics determined by [facility]. It has not been cleared or approved by the Food and Drug Administration.   Vitamin B12     Status: None   Collection Time: 10/28/19  3:34 PM  Result Value Ref Range   Vitamin B-12 698 232 - 1,245 pg/mL  Iron, TIBC and Ferritin Panel     Status: Abnormal   Collection Time: 10/28/19  3:34 PM  Result Value Ref Range   Total Iron Binding Capacity 394 250 - 450 ug/dL   UIBC 337 131 - 425 ug/dL   Iron 57 27 - 159 ug/dL   Iron Saturation 14 (L) 15 - 55 %   Ferritin 19 15 - 150 ng/mL  TSH + free T4     Status: None   Collection Time: 10/28/19  3:34 PM  Result Value Ref Range   TSH 0.920 0.450 - 4.500 uIU/mL   Free T4 0.99 0.82 - 1.77 ng/dL  Comprehensive metabolic panel     Status: None   Collection Time: 10/28/19  3:34 PM  Result Value Ref Range   Glucose 73 65 - 99 mg/dL   BUN 12 6 - 24 mg/dL   Creatinine, Ser 0.86 0.57 - 1.00 mg/dL   GFR calc non Af Amer 80 >59 mL/min/1.73   GFR calc Af Amer 92 >59 mL/min/1.73   BUN/Creatinine Ratio 14 9 - 23   Sodium 139 134 - 144 mmol/L   Potassium 3.6 3.5 - 5.2 mmol/L   Chloride 101 96 - 106 mmol/L   CO2 25 20 - 29  mmol/L   Calcium 8.9 8.7 - 10.2 mg/dL   Total Protein 6.8 6.0 - 8.5 g/dL   Albumin 3.9 3.8 - 4.8 g/dL   Globulin, Total 2.9 1.5 - 4.5 g/dL   Albumin/Globulin Ratio 1.3 1.2 - 2.2   Bilirubin Total <0.2 0.0 - 1.2 mg/dL   Alkaline Phosphatase 101 39 - 117 IU/L   AST 18 0 - 40 IU/L   ALT 15 0 - 32 IU/L  CBC With Differential     Status: Abnormal   Collection Time: 10/28/19  3:34 PM  Result Value Ref Range   WBC 8.2 3.4 - 10.8 x10E3/uL   RBC 3.72 (L) 3.77 - 5.28 x10E6/uL   Hemoglobin 10.5 (L) 11.1 - 15.9 g/dL   Hematocrit 31.3 (L) 34.0 - 46.6 %   MCV 84 79 -  97 fL   MCH 28.2 26.6 - 33.0 pg   MCHC 33.5 31.5 - 35.7 g/dL   RDW 14.4 11.7 - 15.4 %   Neutrophils 67 Not Estab. %   Lymphs 26 Not Estab. %   Monocytes 5 Not Estab. %   Eos 1 Not Estab. %   Basos 1 Not Estab. %   Neutrophils Absolute 5.6 1.4 - 7.0 x10E3/uL   Lymphocytes Absolute 2.1 0.7 - 3.1 x10E3/uL   Monocytes Absolute 0.4 0.1 - 0.9 x10E3/uL   EOS (ABSOLUTE) 0.1 0.0 - 0.4 x10E3/uL   Basophils Absolute 0.0 0.0 - 0.2 x10E3/uL   Immature Granulocytes 0 Not Estab. %   Immature Grans (Abs) 0.0 0.0 - 0.1 x10E3/uL  Urinalysis, Routine w reflex microscopic     Status: None   Collection Time: 01/03/20  3:15 PM  Result Value Ref Range   Specific Gravity, UA 1.016 1.005 - 1.030   pH, UA 5.5 5.0 - 7.5   Color, UA Yellow Yellow   Appearance Ur Clear Clear   Leukocytes,UA Negative Negative   Protein,UA Negative Negative/Trace   Glucose, UA Negative Negative   Ketones, UA Negative Negative   RBC, UA Negative Negative   Bilirubin, UA Negative Negative   Urobilinogen, Ur 0.2 0.2 - 1.0 mg/dL   Nitrite, UA Negative Negative   Microscopic Examination Comment     Comment: Microscopic not indicated and not performed.   Assessment/Plan: 1. Encounter for general adult medical examination with abnormal findings Annual health maintenance exam today.   2. Dorsalgia, unspecified Added flector patch. Should apply to affected area twice daily as needed. Samples provided. Prescription sent to pharmacy.  - diclofenac (FLECTOR) 1.3 % PTCH; Place 1 patch onto the skin 2 (two) times daily.  Dispense: 30 patch; Refill: 1  3. Uterine leiomyoma, unspecified location Patient to follow up with GYN provider.  4. Generalized anxiety disorder May take alprazolam 0.25mg  up to twice daily as needed for acute anxiety.  - ALPRAZolam (XANAX) 0.25 MG tablet; Take 1 tablet (0.25 mg total) by mouth 2 (two) times daily as needed.  Dispense: 60 tablet; Refill: 2  5. Dysuria - Urinalysis, Routine w reflex  microscopic  General Counseling: zaylah sicoli understanding of the findings of todays visit and agrees with plan of treatment. I have discussed any further diagnostic evaluation that may be needed or ordered today. We also reviewed her medications today. she has been encouraged to call the office with any questions or concerns that should arise related to todays visit.    Counseling:  This patient was seen by Leretha Pol FNP Collaboration with Dr Lavera Guise as a part of collaborative care  agreement  Orders Placed This Encounter  Procedures  . Urinalysis, Routine w reflex microscopic    Meds ordered this encounter  Medications  . ALPRAZolam (XANAX) 0.25 MG tablet    Sig: Take 1 tablet (0.25 mg total) by mouth 2 (two) times daily as needed.    Dispense:  60 tablet    Refill:  2    Order Specific Question:   Supervising Provider    Answer:   Lavera Guise [id number]  . diclofenac (FLECTOR) 1.3 % PTCH    Sig: Place 1 patch onto the skin 2 (two) times daily.    Dispense:  30 patch    Refill:  1    Samples provided today    Order Specific Question:   Supervising Provider    Answer:   Lavera Guise [id number]    Total time spent: 87 Minutes  Time spent includes review of chart, medications, test results, and follow up plan with the patient.     Lavera Guise, MD  Internal Medicine

## 2020-01-04 LAB — URINALYSIS, ROUTINE W REFLEX MICROSCOPIC
Bilirubin, UA: NEGATIVE
Glucose, UA: NEGATIVE
Ketones, UA: NEGATIVE
Leukocytes,UA: NEGATIVE
Nitrite, UA: NEGATIVE
Protein,UA: NEGATIVE
RBC, UA: NEGATIVE
Specific Gravity, UA: 1.016 (ref 1.005–1.030)
Urobilinogen, Ur: 0.2 mg/dL (ref 0.2–1.0)
pH, UA: 5.5 (ref 5.0–7.5)

## 2020-01-12 DIAGNOSIS — Z0001 Encounter for general adult medical examination with abnormal findings: Secondary | ICD-10-CM | POA: Insufficient documentation

## 2020-01-13 ENCOUNTER — Other Ambulatory Visit: Payer: Self-pay | Admitting: Nurse Practitioner

## 2020-01-13 DIAGNOSIS — Z1231 Encounter for screening mammogram for malignant neoplasm of breast: Secondary | ICD-10-CM

## 2020-01-14 ENCOUNTER — Ambulatory Visit
Admission: RE | Admit: 2020-01-14 | Discharge: 2020-01-14 | Disposition: A | Discharge status: Home / Self Care | Payer: BC Managed Care – PPO | Source: Ambulatory Visit | Attending: Nurse Practitioner | Admitting: Nurse Practitioner

## 2020-01-14 DIAGNOSIS — Z1231 Encounter for screening mammogram for malignant neoplasm of breast: Secondary | ICD-10-CM

## 2020-01-18 ENCOUNTER — Inpatient Hospital Stay
Admission: RE | Admit: 2020-01-18 | Discharge: 2020-01-18 | Disposition: A | Discharge status: Home / Self Care | Payer: Self-pay | Source: Ambulatory Visit | Attending: *Deleted | Admitting: *Deleted

## 2020-01-18 ENCOUNTER — Other Ambulatory Visit: Payer: Self-pay | Admitting: *Deleted

## 2020-01-18 DIAGNOSIS — Z1231 Encounter for screening mammogram for malignant neoplasm of breast: Secondary | ICD-10-CM

## 2020-01-20 ENCOUNTER — Other Ambulatory Visit: Payer: Self-pay

## 2020-01-20 DIAGNOSIS — I10 Essential (primary) hypertension: Secondary | ICD-10-CM

## 2020-01-20 MED ORDER — LISINOPRIL-HYDROCHLOROTHIAZIDE 20-12.5 MG PO TABS: 2.0000 | ORAL_TABLET | Freq: Every day | ORAL | 3 refills | Status: DC

## 2020-01-27 ENCOUNTER — Encounter: Payer: Self-pay | Admitting: Nurse Practitioner

## 2020-01-31 ENCOUNTER — Telehealth: Payer: Self-pay

## 2020-01-31 NOTE — Telephone Encounter (Signed)
PT ADVISED FOR HER MAMMOGRAM IS NORMAL AS PER DFK

## 2020-02-05 ENCOUNTER — Ambulatory Visit: Payer: BC Managed Care – PPO | Attending: Internal Medicine

## 2020-02-05 DIAGNOSIS — Z23 Encounter for immunization: Secondary | ICD-10-CM

## 2020-02-05 NOTE — Progress Notes (Signed)
   Covid-19 Vaccination Clinic  Name:  Joanne Maddox    MRN: [id number] DOB: 1971-08-18  02/05/2020  [name] was observed post Covid-19 immunization for 15 minutes without incident. She was provided with Vaccine Information Sheet and instruction to access the V-Safe system.   [name] was instructed to call 911 with any severe reactions post vaccine: Marland Kitchen Difficulty breathing  . Swelling of face and throat  . A fast heartbeat  . A bad rash all over body  . Dizziness and weakness   Immunizations Administered    Name Date Dose VIS Date Route   Pfizer COVID-19 Vaccine 02/05/2020  8:56 AM 0.3 mL 11/17/2018 Intramuscular   Manufacturer: [place]   Lot: [id number]   [place]: [id number]

## 2020-02-11 ENCOUNTER — Other Ambulatory Visit (HOSPITAL_COMMUNITY)
Admission: RE | Admit: 2020-02-11 | Discharge: 2020-02-11 | Disposition: A | Discharge status: Home / Self Care | Payer: BC Managed Care – PPO | Source: Ambulatory Visit | Attending: Obstetrics and Gynecology | Admitting: Obstetrics and Gynecology

## 2020-02-11 ENCOUNTER — Encounter: Payer: Self-pay | Admitting: Obstetrics and Gynecology

## 2020-02-11 ENCOUNTER — Ambulatory Visit (INDEPENDENT_AMBULATORY_CARE_PROVIDER_SITE_OTHER): Payer: BC Managed Care – PPO | Admitting: Obstetrics and Gynecology

## 2020-02-11 ENCOUNTER — Other Ambulatory Visit: Payer: Self-pay

## 2020-02-11 VITALS — BP 110/70 | Ht <= 58 in | Wt 162.0 lb

## 2020-02-11 DIAGNOSIS — Z01419 Encounter for gynecological examination (general) (routine) without abnormal findings: Secondary | ICD-10-CM

## 2020-02-11 DIAGNOSIS — D251 Intramural leiomyoma of uterus: Secondary | ICD-10-CM

## 2020-02-11 DIAGNOSIS — Z1211 Encounter for screening for malignant neoplasm of colon: Secondary | ICD-10-CM

## 2020-02-11 DIAGNOSIS — Z124 Encounter for screening for malignant neoplasm of cervix: Secondary | ICD-10-CM | POA: Insufficient documentation

## 2020-02-11 DIAGNOSIS — Z1239 Encounter for other screening for malignant neoplasm of breast: Secondary | ICD-10-CM | POA: Diagnosis not present

## 2020-02-11 DIAGNOSIS — D252 Subserosal leiomyoma of uterus: Secondary | ICD-10-CM

## 2020-02-11 NOTE — Progress Notes (Signed)
Gynecology Annual Exam  PCP: Lavera Guise, MD  Chief Complaint:  Chief Complaint  Patient presents with  . Gynecologic Exam    History of Present Illness: Patient is a 49 y.o. G1P1001 presents for annual exam. The patient has no complaints today.   LMP: Patient's last menstrual period was 01/08/2020 (approximate). Intervals have spaced out but remain heavy.  The patient does perform self breast exams.  There is no notable family history of breast or ovarian cancer in her family.  The patient wears seatbelts: yes.   The patient has regular exercise: not asked.    The patient denies current symptoms of depression.    Review of Systems: Review of Systems  Constitutional: Negative for chills and fever.  HENT: Negative for congestion.   Respiratory: Negative for cough and shortness of breath.   Cardiovascular: Negative for chest pain and palpitations.  Gastrointestinal: Negative for abdominal pain, constipation, diarrhea, heartburn, nausea and vomiting.  Genitourinary: Positive for frequency. Negative for dysuria and urgency.  Musculoskeletal: Positive for back pain.  Skin: Negative for itching and rash.  Neurological: Negative for dizziness and headaches.  Endo/Heme/Allergies: Negative for polydipsia.  Psychiatric/Behavioral: Negative for depression.    Past Medical History:  Patient Active Problem List   Diagnosis Date Noted  . Encounter for general adult medical examination with abnormal findings 01/12/2020  . Impaired fasting glucose 10/24/2019  . Abnormal weight gain 10/24/2019  . Generalized abdominal tenderness without rebound tenderness 08/01/2019  . Irritable bowel syndrome with constipation 08/01/2019  . Sore throat 08/05/2018  . Iron deficiency anemia 08/05/2018  . Screening for breast cancer 12/17/2017  . Acute sinusitis, unspecified 09/17/2017  . Chronic mucoid otitis media, unspecified ear 09/17/2017  . Acute pharyngitis 09/17/2017  . Leiomyoma of  uterus, unspecified 09/17/2017  . Excessive and frequent menstruation with irregular cycle 09/17/2017  . Dorsalgia, unspecified 09/17/2017  . 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 Migraine with aura, intractable, without status migrainosus 09/17/2017  . Vitamin D deficiency 09/17/2017  . Chest pain, unspecified 09/17/2017  . Pain in right shoulder 09/17/2017  . Myalgia 09/17/2017  . Other fatigue 09/17/2017  . Generalized abdominal pain 09/17/2017  . Other constipation 09/17/2017  . Gastro-esophageal reflux disease without esophagitis 09/17/2017  . Mixed hyperlipidemia 09/17/2017  . Essential hypertension 09/17/2017  . Generalized anxiety disorder 09/17/2017  . Dizziness and giddiness 09/17/2017  . Low back pain 09/17/2017  . Dysuria 09/17/2017  . Gall bladder polyp 01/07/2014    Past Surgical History:  Past Surgical History:  Procedure Laterality Date  . CESAREAN SECTION  1989  . COLONOSCOPY  2010   Dr Vira Agar  . KNEE SURGERY Right 1999    Gynecologic History:  Patient's last menstrual period was 01/08/2020 (approximate). Last Pap: Results were: 07/31/2015 NIL and HR HPV negative  Last mammogram: 01/14/2020 Results were: BI-RAD I  Obstetric History: G1P1001  Family History:  Family History  Problem Relation Age of Onset  . Diabetes Mother   . Hypertension Mother   . Heart disease Mother   . Hypertension Father   . Heart disease Father   . Breast cancer Neg Hx     Social History:  Social History   Socioeconomic History  . Marital status: Married    Spouse name: Not on file  . Number of children: Not on file  . Years of education: Not on file  . Highest education level: Not on file  Occupational History  . Not on file  Tobacco Use  .  Smoking status: Never Smoker  . Smokeless tobacco: Never Used  Substance and Sexual Activity  . Alcohol use: Yes    Comment: occasionally  . Drug use: No  . Sexual activity: Yes    Birth control/protection: None  Other  Topics Concern  . Not on file  Social History Narrative  . Not on file   Social Determinants of Health   Financial Resource Strain:   . Difficulty of Paying Living Expenses:   Food Insecurity:   . Worried About Charity fundraiser in the Last Year:   . Arboriculturist in the Last Year:   Transportation Needs:   . Film/video editor (Medical):   Marland Kitchen Lack of Transportation (Non-Medical):   Physical Activity:   . Days of Exercise per Week:   . Minutes of Exercise per Session:   Stress:   . Feeling of Stress :   Social Connections:   . Frequency of Communication with Friends and Family:   . Frequency of Social Gatherings with Friends and Family:   . Attends Religious Services:   . Active Member of Clubs or Organizations:   . Attends Archivist Meetings:   Marland Kitchen Marital Status:   Intimate Partner Violence:   . Fear of Current or Ex-Partner:   . Emotionally Abused:   Marland Kitchen Physically Abused:   . Sexually Abused:     Allergies:  Allergies  Allergen Reactions  . Bactrim [Sulfamethoxazole-Trimethoprim] Hives    Medications: Prior to Admission medications   Medication Sig Start Date End Date Taking? Authorizing Provider  ALPRAZolam (XANAX) 0.25 MG tablet Take 1 tablet (0.25 mg total) by mouth 2 (two) times daily as needed. 01/03/20  Yes Boscia, Greer Ee, NP  diclofenac (FLECTOR) 1.3 % PTCH Place 1 patch onto the skin 2 (two) times daily. 01/03/20  Yes Boscia, Heather E, NP  Fe Cbn-Fe Gluc-FA-B12-C-DSS (FERRALET 90) 90-1 MG TABS Take by mouth. 08/04/18  Yes [provider]  fluticasone (FLONASE) 50 MCG/ACT nasal spray Place 2 sprays into both nostrils daily. 12/01/19  Yes Ronnell Freshwater, NP  linaclotide (LINZESS) 72 MCG capsule Take 1 capsule (72 mcg total) by mouth daily before breakfast. 07/15/19  Yes Boscia, Heather E, NP  lisinopril-hydrochlorothiazide (ZESTORETIC) 20-12.5 MG tablet Take 2 tablets by mouth daily. 01/20/20  Yes Boscia, Greer Ee, NP  lovastatin  (MEVACOR) 40 MG tablet Take 40 mg by mouth at bedtime.  11/03/13  Yes [provider]  omeprazole (PRILOSEC) 40 MG capsule Take 1 capsule (40 mg total) by mouth daily. 08/04/18  Yes Boscia, Greer Ee, NP  SUMAtriptan (IMITREX) 50 MG tablet Take 1 tablet (50 mg total) by mouth every 2 (two) hours as needed. 08/25/19  Yes Boscia, Greer Ee, NP  Vitamin D, Ergocalciferol, (DRISDOL) 1.25 MG (50000 UNIT) CAPS capsule TAKE 1 CAPSULE BY MOUTH ONE TIME PER WEEK 12/16/19  Yes Ronnell Freshwater, NP    Physical Exam Vitals: Blood pressure 110/70, height 4\' 9"  (1.448 m), weight 162 lb (73.5 kg), last menstrual period 01/08/2020.  General: NAD HEENT: normocephalic, anicteric Thyroid: no enlargement, no palpable nodules Pulmonary: No increased work of breathing, CTAB Cardiovascular: RRR, distal pulses 2+ Breast: Breast symmetrical, no tenderness, no palpable nodules or masses, no skin or nipple retraction present, no nipple discharge.  No axillary or supraclavicular lymphadenopathy. Abdomen: NABS, soft, non-tender, non-distended.  Umbilicus without lesions.  No hepatomegaly, splenomegaly or masses palpable. No evidence of hernia  Genitourinary:  External: Normal external female genitalia.  Normal urethral meatus, normal Bartholin's and Skene's glands.    Vagina: Normal vaginal mucosa, no evidence of prolapse.    Cervix: Grossly normal in appearance, no bleeding  Uterus: Enlarged, mobile, irregular contour.  18-20 weeks size.  No CMT  Adnexa: ovaries non-enlarged, no adnexal masses  Rectal: deferred  Lymphatic: no evidence of inguinal lymphadenopathy Extremities: no edema, erythema, or tenderness Neurologic: Grossly intact Psychiatric: mood appropriate, affect full  Female chaperone present for pelvic and breast  portions of the physical exam    Assessment: 49 y.o. G1P1001 routine annual exam  Plan: Problem List Items Addressed This Visit      Genitourinary   Leiomyoma of uterus,  unspecified   Relevant Orders   US Transvaginal Non-OB    Other Visit Diagnoses    Encounter for gynecological examination without abnormal finding    -  Primary   Screening for malignant neoplasm of cervix       Relevant Orders   Cytology - PAP   Breast screening       Colon cancer screening       Relevant Orders   Ambulatory referral to Gastroenterology      1) Mammogram - recommend yearly screening mammogram.  Mammogram Is up to date   2) STI screening  was notoffered and therefore not obtained  3) ASCCP guidelines and rational discussed.  Patient opts for every 3 years screening interval  4) Uterine fibroids - some mass effect symptoms constipation, urinary frequency, back and leg pain.  Have previously discussed hysterectomy vs [place].  Will obtain follow up ultrasound to assess size  5) Colonoscopy -- Screening recommended starting at age 33 for average risk individuals, age 86 for individuals deemed at increased risk (including African Americans) and recommended to continue until age 54.  For patient age 76-85 individualized approach is recommended.  Gold standard screening is via colonoscopy, Cologuard screening is an acceptable alternative for patient unwilling or unable to undergo colonoscopy.  "Colorectal cancer screening for average?risk adults: 2018 guideline update from the American Cancer Society"CA: A Cancer Journal for Clinicians: Feb 19, 2017   6) Routine healthcare maintenance including cholesterol, diabetes screening discussed managed by PCP  7) Return in about 2 weeks (around 02/25/2020) for 2-4 weeks ultrasound and follow up, 1 year annual.   Malachy Mood, MD, [place], [place] 02/11/2020, 3:09 PM

## 2020-02-15 LAB — CYTOLOGY - PAP
Comment: NEGATIVE
Diagnosis: NEGATIVE
High risk HPV: NEGATIVE

## 2020-02-29 ENCOUNTER — Ambulatory Visit: Payer: BC Managed Care – PPO

## 2020-03-07 ENCOUNTER — Ambulatory Visit: Payer: BC Managed Care – PPO | Attending: Internal Medicine

## 2020-03-07 DIAGNOSIS — Z23 Encounter for immunization: Secondary | ICD-10-CM

## 2020-03-07 NOTE — Progress Notes (Signed)
   Covid-19 Vaccination Clinic  Name:  Joanne Maddox    MRN: [id number] DOB: 1971/03/31  03/07/2020  Joanne Maddox was observed post Covid-19 immunization for 15 minutes without incident. She was provided with Vaccine Information Sheet and instruction to access the V-Safe system.   Joanne Maddox was instructed to call 911 with any severe reactions post vaccine: Marland Kitchen Difficulty breathing  . Swelling of face and throat  . A fast heartbeat  . A bad rash all over body  . Dizziness and weakness   Immunizations Administered    Name Date Dose VIS Date Route   Pfizer COVID-19 Vaccine 03/07/2020  3:51 PM 0.3 mL 11/17/2018 Intramuscular   Manufacturer: [place]   Lot: [id number]   [place]: [id number]

## 2020-03-14 ENCOUNTER — Ambulatory Visit (INDEPENDENT_AMBULATORY_CARE_PROVIDER_SITE_OTHER): Payer: BC Managed Care – PPO

## 2020-03-14 ENCOUNTER — Other Ambulatory Visit: Payer: Self-pay | Admitting: Obstetrics and Gynecology

## 2020-03-14 ENCOUNTER — Other Ambulatory Visit: Payer: Self-pay

## 2020-03-14 ENCOUNTER — Other Ambulatory Visit (HOSPITAL_COMMUNITY)
Admission: RE | Admit: 2020-03-14 | Discharge: 2020-03-14 | Disposition: A | Discharge status: Home / Self Care | Payer: BC Managed Care – PPO | Source: Ambulatory Visit | Attending: Obstetrics and Gynecology | Admitting: Obstetrics and Gynecology

## 2020-03-14 ENCOUNTER — Ambulatory Visit (INDEPENDENT_AMBULATORY_CARE_PROVIDER_SITE_OTHER): Payer: BC Managed Care – PPO | Admitting: Obstetrics and Gynecology

## 2020-03-14 ENCOUNTER — Ambulatory Visit: Payer: BC Managed Care – PPO | Admitting: Obstetrics and Gynecology

## 2020-03-14 ENCOUNTER — Encounter: Payer: Self-pay | Admitting: Obstetrics and Gynecology

## 2020-03-14 ENCOUNTER — Ambulatory Visit: Payer: BC Managed Care – PPO

## 2020-03-14 VITALS — BP 122/78 | Ht <= 58 in | Wt 161.0 lb

## 2020-03-14 DIAGNOSIS — N939 Abnormal uterine and vaginal bleeding, unspecified: Secondary | ICD-10-CM

## 2020-03-14 DIAGNOSIS — N841 Polyp of cervix uteri: Secondary | ICD-10-CM | POA: Diagnosis not present

## 2020-03-14 DIAGNOSIS — D252 Subserosal leiomyoma of uterus: Secondary | ICD-10-CM

## 2020-03-14 DIAGNOSIS — D251 Intramural leiomyoma of uterus: Secondary | ICD-10-CM | POA: Diagnosis not present

## 2020-03-14 DIAGNOSIS — D250 Submucous leiomyoma of uterus: Secondary | ICD-10-CM

## 2020-03-14 NOTE — Progress Notes (Signed)
Gynecology Ultrasound Follow Up  Chief Complaint:  Chief Complaint  Patient presents with  . Follow-up    Gyn ultrasound     History of Present Illness: Patient is a 49 y.o. female who presents today for ultrasound evaluation of uterine fibroids.  Ultrasound demonstrates the following findgins Adnexa: 46 x 30 x 63mm simple right ovarian cyst, normal left ovary Uterus: Enlarged with multiple fibroids, these have increased in number since last evaluation 2019 but the largest has remained stable in size, 4.22mm endometrial stripe although evaluation of stripe is limited on study secondary to shadowing from fibroids. Additional: no free fluid  Review of Systems: Review of Systems  Constitutional: Negative.   Gastrointestinal: Negative for constipation.  Genitourinary: Negative.   Neurological: Negative for headaches.    Past Medical History:  Past Medical History:  Diagnosis Date  . Anxiety   . Colon polyp 2010  . GERD (gastroesophageal reflux disease)   . Hypercholesteremia   . Hyperlipidemia   . Hypertension   . Migraine     Past Surgical History:  Past Surgical History:  Procedure Laterality Date  . CESAREAN SECTION  1989  . COLONOSCOPY  2010   Dr Vira Agar  . KNEE SURGERY Right 1999    Gynecologic History:  No LMP recorded.  Family History:  Family History  Problem Relation Age of Onset  . Diabetes Mother   . Hypertension Mother   . Heart disease Mother   . Hypertension Father   . Heart disease Father   . Breast cancer Neg Hx     Social History:  Social History   Socioeconomic History  . Marital status: Married    Spouse name: Not on file  . Number of children: Not on file  . Years of education: Not on file  . Highest education level: Not on file  Occupational History  . Not on file  Tobacco Use  . Smoking status: Never Smoker  . Smokeless tobacco: Never Used  Vaping Use  . Vaping Use: Never used  Substance and Sexual Activity  . Alcohol  use: Yes    Comment: occasionally  . Drug use: No  . Sexual activity: Yes    Birth control/protection: None  Other Topics Concern  . Not on file  Social History Narrative  . Not on file   Social Determinants of Health   Financial Resource Strain:   . Difficulty of Paying Living Expenses:   Food Insecurity:   . Worried About Charity fundraiser in the Last Year:   . Arboriculturist in the Last Year:   Transportation Needs:   . Film/video editor (Medical):   Marland Kitchen Lack of Transportation (Non-Medical):   Physical Activity:   . Days of Exercise per Week:   . Minutes of Exercise per Session:   Stress:   . Feeling of Stress :   Social Connections:   . Frequency of Communication with Friends and Family:   . Frequency of Social Gatherings with Friends and Family:   . Attends Religious Services:   . Active Member of Clubs or Organizations:   . Attends Archivist Meetings:   Marland Kitchen Marital Status:   Intimate Partner Violence:   . Fear of Current or Ex-Partner:   . Emotionally Abused:   Marland Kitchen Physically Abused:   . Sexually Abused:     Allergies:  Allergies  Allergen Reactions  . Bactrim [Sulfamethoxazole-Trimethoprim] Hives    Medications: Prior to Admission medications  Medication Sig Start Date End Date Taking? Authorizing Provider  ALPRAZolam (XANAX) 0.25 MG tablet Take 1 tablet (0.25 mg total) by mouth 2 (two) times daily as needed. 01/03/20   Ronnell Freshwater, NP  diclofenac (FLECTOR) 1.3 % PTCH Place 1 patch onto the skin 2 (two) times daily. 01/03/20   Ronnell Freshwater, NP  Fe Cbn-Fe Gluc-FA-B12-C-DSS (FERRALET 90) 90-1 MG TABS Take by mouth. 08/04/18   [provider]  fluticasone (FLONASE) 50 MCG/ACT nasal spray Place 2 sprays into both nostrils daily. 12/01/19   Ronnell Freshwater, NP  linaclotide (LINZESS) 72 MCG capsule Take 1 capsule (72 mcg total) by mouth daily before breakfast. 07/15/19   Ronnell Freshwater, NP  lisinopril-hydrochlorothiazide  (ZESTORETIC) 20-12.5 MG tablet Take 2 tablets by mouth daily. 01/20/20   Ronnell Freshwater, NP  lovastatin (MEVACOR) 40 MG tablet Take 40 mg by mouth at bedtime.  11/03/13   [provider]  omeprazole (PRILOSEC) 40 MG capsule Take 1 capsule (40 mg total) by mouth daily. 08/04/18   Ronnell Freshwater, NP  SUMAtriptan (IMITREX) 50 MG tablet Take 1 tablet (50 mg total) by mouth every 2 (two) hours as needed. 08/25/19   Ronnell Freshwater, NP  Vitamin D, Ergocalciferol, (DRISDOL) 1.25 MG (50000 UNIT) CAPS capsule TAKE 1 CAPSULE BY MOUTH ONE TIME PER WEEK 12/16/19   Ronnell Freshwater, NP    Physical Exam Vitals: Blood pressure 122/78, height 4\' 9"  (1.448 m), weight 161 lb (73 kg).  General: NAD HEENT: normocephalic, anicteric Pulmonary: No increased work of breathing Extremities: no edema, erythema, or tenderness Neurologic: Grossly intact, normal gait Psychiatric: mood appropriate, affect full  US PELVIC COMPLETE WITH TRANSVAGINAL  Result Date: 03/14/2020 Patient Name: NICLOE FRONTERA DOB: 06/20/1971 MRN: [id number] ULTRASOUND REPORT Location: [facility] Date of Service: 03/14/2020 Indications: Uterine fibroids Findings: The uterus is retroverted and measures 15.2 x 9.2 x 8.1 cm. Echo texture is heterogenous with evidence of focal masses. Within the uterus are multiple suspected fibroids measuring: Fibroid 1: 64 x 67 x 56 mm intramural right, lower uterine segment Fibroid 2: 32 x 31 x 35 mm intramural posterior Fibroid 3: 56 x 47 x 51 mm subserosal fundal Fibroid 4: 22 x 20 x 21 mm anterior intramural The Endometrium measures 4.6 mm. The endometrium is not clearly visualized due to the fibroids. Right Ovary measures 5.1 x 4.1 x 4.3 cm. There is a simple cyst in the right ovary measuring 46 x 30 x 30 mm Left Ovary measures 3.1 x 2.5 x 2.3 cm. It is normal in appearance. Survey of the adnexa demonstrates no adnexal masses. There is no free fluid in the cul de sac. Impression: 1. There are multiple  uterine fibroids. 2. The endometrium is not well visualized due to the fibroids. 3. There is a 4.6 cm simple cyst in the right ovary. 4. Normal appearing left ovary. Recommendations: 1.Clinical correlation with the patient's History and Physical Exam. Gweneth Dimitri, RT Images reviewed.  Multiple fibroid are visualized on today study with the largest measuring 64x67x26mm and has stayed relatively stable in size from prior study on 12/31/2017.  None of the the fibroids are submucosal.  An incidental simple appearing right ovarian cyst is visualized measuring 46 x 30 x 26mm. Malachy Mood, MD, [facility], [facility] 03/14/2020, 10:07 AM    ENDOMETRIAL BIOPSY     The indications for endometrial biopsy were reviewed.   Risks of the biopsy including cramping, bleeding,  infection, uterine perforation, inadequate specimen and need for additional procedures  were discussed. The patient states she understands and agrees to undergo procedure today. Consent was signed. Time out was performed. Urine HCG was negative. A Graves speculum was placed and the cervix was brought into view.  The cervix was prepped with Betadine. A single-toothed tenaculum was  placed on the anterior lip of the cervix for traction. A 3 mm pipelle was introduced through the cervix into the endometrial cavity without difficulty to a depth of 8cm, and a small amount of tissue was obtained, the resulting specime sent to pathology. The instruments were removed from the patient's vagina. Minimal bleeding from the cervix was noted. The patient tolerated the procedure well. Routine post-procedure instructions were given to the patient.  She will be contacted by phone one results become available.     An incident cervical polyp was noted during endometrial biopsy today and was removed using a ring forceps prior to the endometrial biopsy  Assessment: 49 y.o. G1P1001 follow up uterine fibroids  Plan: Problem List Items  Addressed This Visit    None    Visit Diagnoses    Abnormal uterine bleeding    -  Primary   Relevant Orders   Surgical pathology   Cervical polyp       Relevant Orders   Surgical pathology      1) Uterine fibroids - relatively stable in size but patient with more mass effect symptoms such as pressure, urinary frequency, and back pain.  We discussed management options including expectant, hormonal, uterine artery embolization, depo provera, and hysterectomy.  Patient opts for hysterectomy - endometrial bx and polypectomy today - will post for TLH, BS, cysto we discussed potential need for conversion to open procedure if limited visibility or if needed for uterine morcellation  2) A total of 20 minutes were spent in face-to-face contact with the patient during this encounter with over half of that time devoted to counseling and coordination of care.    Malachy Mood, MD, [facility], [facility] 03/14/2020, 10:37 AM

## 2020-03-15 LAB — SURGICAL PATHOLOGY

## 2020-03-24 ENCOUNTER — Telehealth: Payer: Self-pay | Admitting: Obstetrics and Gynecology

## 2020-03-30 ENCOUNTER — Telehealth: Payer: Self-pay

## 2020-03-30 NOTE — Telephone Encounter (Signed)
Confirmed and screened for 04-03-20 ov.

## 2020-04-03 ENCOUNTER — Encounter: Payer: Self-pay | Admitting: Nurse Practitioner

## 2020-04-03 ENCOUNTER — Ambulatory Visit: Payer: BC Managed Care – PPO | Admitting: Nurse Practitioner

## 2020-04-03 ENCOUNTER — Other Ambulatory Visit: Payer: Self-pay

## 2020-04-03 VITALS — BP 106/59 | HR 77 | Temp 97.5°F | Resp 16 | Ht <= 58 in | Wt 163.6 lb

## 2020-04-03 DIAGNOSIS — F411 Generalized anxiety disorder: Secondary | ICD-10-CM

## 2020-04-03 DIAGNOSIS — M549 Dorsalgia, unspecified: Secondary | ICD-10-CM

## 2020-04-03 DIAGNOSIS — D259 Leiomyoma of uterus, unspecified: Secondary | ICD-10-CM

## 2020-04-03 DIAGNOSIS — I10 Essential (primary) hypertension: Secondary | ICD-10-CM

## 2020-04-03 MED ORDER — ETODOLAC 400 MG PO TABS: 400.0000 mg | ORAL_TABLET | Freq: Two times a day (BID) | ORAL | 3 refills | Status: DC

## 2020-04-03 MED ORDER — FLUOXETINE HCL 10 MG PO CAPS: 10.0000 mg | ORAL_CAPSULE | Freq: Every day | ORAL | 2 refills | Status: DC

## 2020-04-03 MED ORDER — ALPRAZOLAM 0.25 MG PO TABS: 0.2500 mg | ORAL_TABLET | Freq: Two times a day (BID) | ORAL | 2 refills | Status: DC | PRN

## 2020-04-03 NOTE — Progress Notes (Signed)
[facility] [place], [place] [id number]  Internal MEDICINE  Office Visit Note  Patient Name: Joanne Maddox  [id number]  [id number]  Date of Service: 04/12/2020  Chief Complaint  Patient presents with  . Follow-up  . Leg Pain    Still has pain in both legs    The patient is here for routine follow up. States that she is having increased anxiety and depression. Feels on edge pretty often. States that her husband is still out of work and she is feeling a lot of pressure to pay all of her bills on her own. She is working two jobs.  She does have consistent pain and fatigue in both lower legs. Has seen GYN. Has fibroid tumors. They have suggested hysterectomy. Feel as though this will help the leg discomfort. The patient's daughter is getting married in November and she is going to try to wait for wedding to have surgery.       Current Medication: Outpatient Encounter Medications as of 04/03/2020  Medication Sig Note  . ALPRAZolam (XANAX) 0.25 MG tablet Take 1 tablet (0.25 mg total) by mouth 2 (two) times daily as needed.   . diclofenac (FLECTOR) 1.3 % PTCH Place 1 patch onto the skin 2 (two) times daily.   Marland Kitchen Fe Cbn-Fe Gluc-FA-B12-C-DSS (FERRALET 90) 90-1 MG TABS Take by mouth.   . fluticasone (FLONASE) 50 MCG/ACT nasal spray Place 2 sprays into both nostrils daily.   Marland Kitchen linaclotide (LINZESS) 72 MCG capsule Take 1 capsule (72 mcg total) by mouth daily before breakfast.   . lisinopril-hydrochlorothiazide (ZESTORETIC) 20-12.5 MG tablet Take 2 tablets by mouth daily.   Marland Kitchen lovastatin (MEVACOR) 40 MG tablet Take 40 mg by mouth at bedtime.  01/05/2014: Received from: External Pharmacy  . omeprazole (PRILOSEC) 40 MG capsule Take 1 capsule (40 mg total) by mouth daily.   . SUMAtriptan (IMITREX) 50 MG tablet Take 1 tablet (50 mg total) by mouth every 2 (two) hours as needed.   . Vitamin D, Ergocalciferol, (DRISDOL) 1.25 MG (50000 UNIT) CAPS capsule TAKE 1 CAPSULE BY MOUTH  ONE TIME PER WEEK   . [DISCONTINUED] ALPRAZolam (XANAX) 0.25 MG tablet Take 1 tablet (0.25 mg total) by mouth 2 (two) times daily as needed.   . etodolac (LODINE) 400 MG tablet Take 1 tablet (400 mg total) by mouth 2 (two) times daily.   Marland Kitchen FLUoxetine (PROZAC) 10 MG capsule Take 1 capsule (10 mg total) by mouth daily.    No facility-administered encounter medications on file as of 04/03/2020.    Surgical History: Past Surgical History:  Procedure Laterality Date  . CESAREAN SECTION  1989  . COLONOSCOPY  2010   Dr Vira Agar  . KNEE SURGERY Right 1999    Medical History: Past Medical History:  Diagnosis Date  . Anxiety   . Colon polyp 2010  . GERD (gastroesophageal reflux disease)   . Hypercholesteremia   . Hyperlipidemia   . Hypertension   . Migraine     Family History: Family History  Problem Relation Age of Onset  . Diabetes Mother   . Hypertension Mother   . Heart disease Mother   . Hypertension Father   . Heart disease Father   . Breast cancer Neg Hx     Social History   Socioeconomic History  . Marital status: Married    Spouse name: Not on file  . Number of children: Not on file  . Years of education: Not on file  .  Highest education level: Not on file  Occupational History  . Not on file  Tobacco Use  . Smoking status: Never Smoker  . Smokeless tobacco: Never Used  Vaping Use  . Vaping Use: Never used  Substance and Sexual Activity  . Alcohol use: Yes    Comment: occasionally  . Drug use: No  . Sexual activity: Yes    Birth control/protection: None  Other Topics Concern  . Not on file  Social History Narrative  . Not on file   Social Determinants of Health   Financial Resource Strain:   . Difficulty of Paying Living Expenses:   Food Insecurity:   . Worried About Charity fundraiser in the Last Year:   . Arboriculturist in the Last Year:   Transportation Needs:   . Film/video editor (Medical):   Marland Kitchen Lack of Transportation (Non-Medical):    Physical Activity:   . Days of Exercise per Week:   . Minutes of Exercise per Session:   Stress:   . Feeling of Stress :   Social Connections:   . Frequency of Communication with Friends and Family:   . Frequency of Social Gatherings with Friends and Family:   . Attends Religious Services:   . Active Member of Clubs or Organizations:   . Attends Archivist Meetings:   Marland Kitchen Marital Status:   Intimate Partner Violence:   . Fear of Current or Ex-Partner:   . Emotionally Abused:   Marland Kitchen Physically Abused:   . Sexually Abused:       Review of Systems  Constitutional: Positive for activity change and fatigue. Negative for chills and unexpected weight change.  HENT: Negative for congestion, postnasal drip, rhinorrhea, sneezing and sore throat.   Respiratory: Negative for cough, chest tightness, shortness of breath and wheezing.   Cardiovascular: Negative for chest pain and palpitations.  Gastrointestinal: Negative for abdominal pain, constipation, diarrhea, nausea and vomiting.  Endocrine: Negative for cold intolerance, heat intolerance, polydipsia and polyuria.  Genitourinary: Positive for menstrual problem. Negative for dysuria, frequency and hematuria.       Heavy menstrual periods. History of fibroid tumors.   Musculoskeletal: Positive for back pain and myalgias. Negative for arthralgias, joint swelling and neck pain.  Skin: Negative for rash.  Allergic/Immunologic: Positive for environmental allergies.  Neurological: Positive for headaches. Negative for dizziness, tremors and numbness.  Hematological: Negative for adenopathy. Does not bruise/bleed easily.  Psychiatric/Behavioral: Positive for dysphoric mood. Negative for behavioral problems (Depression), sleep disturbance and suicidal ideas. The patient is not nervous/anxious.     Today's Vitals   04/03/20 1557  BP: (!) 106/59  Pulse: 77  Resp: 16  Temp: (!) 97.5 F (36.4 C)  SpO2: 100%  Weight: 163 lb 9.6 oz (74.2  kg)  Height: 4\' 9"  (1.448 m)   Body mass index is 35.4 kg/m.  Physical Exam Vitals and nursing note reviewed.  Constitutional:      General: She is not in acute distress.    Appearance: Normal appearance. She is well-developed. She is not diaphoretic.  HENT:     Head: Normocephalic and atraumatic.     Nose: Nose normal.     Mouth/Throat:     Pharynx: No oropharyngeal exudate.  Eyes:     Pupils: Pupils are equal, round, and reactive to light.  Neck:     Thyroid: No thyromegaly.     Vascular: No carotid bruit or JVD.     Trachea: No tracheal deviation.  Cardiovascular:  Rate and Rhythm: Normal rate and regular rhythm.     Heart sounds: Normal heart sounds. No murmur heard.  No friction rub. No gallop.   Pulmonary:     Effort: Pulmonary effort is normal. No respiratory distress.     Breath sounds: Normal breath sounds. No wheezing or rales.  Chest:     Chest wall: No tenderness.  Abdominal:     Palpations: Abdomen is soft.  Musculoskeletal:        General: Normal range of motion.     Cervical back: Normal range of motion and neck supple.     Comments: Lower back pain with tenderness in both hips and upper legs.   Lymphadenopathy:     Cervical: No cervical adenopathy.  Skin:    General: Skin is warm and dry.  Neurological:     Mental Status: She is alert and oriented to person, place, and time.     Cranial Nerves: No cranial nerve deficit.  Psychiatric:        Attention and Perception: Attention and perception normal.        Mood and Affect: Mood is anxious and depressed.        Speech: Speech normal.        Behavior: Behavior normal. Behavior is cooperative.        Thought Content: Thought content normal.        Cognition and Memory: Cognition and memory normal.        Judgment: Judgment normal.   Assessment/Plan: 1. Essential hypertension bp stable. Continue medication as prescribed   2. Dorsalgia, unspecified New prescription for lodine 400mg  may be taken  up to twice daily as needed for pain/inflammation.  - etodolac (LODINE) 400 MG tablet; Take 1 tablet (400 mg total) by mouth 2 (two) times daily.  Dispense: 60 tablet; Refill: 3  3. Generalized anxiety disorder Worsening due to increased personal stress levels. Start prozac 10mg  daily. May take alprazolam 0.25mg  up to twice daily as needed for acute anxiety. Refills for both were sent to her pharmacy today.  - FLUoxetine (PROZAC) 10 MG capsule; Take 1 capsule (10 mg total) by mouth daily.  Dispense: 30 capsule; Refill: 2 - ALPRAZolam (XANAX) 0.25 MG tablet; Take 1 tablet (0.25 mg total) by mouth 2 (two) times daily as needed.  Dispense: 60 tablet; Refill: 2  4. Uterine leiomyoma, unspecified location Regular visits with GYN as scheduled  General Counseling: corianne buccellato understanding of the findings of todays visit and agrees with plan of treatment. I have discussed any further diagnostic evaluation that may be needed or ordered today. We also reviewed her medications today. she has been encouraged to call the office with any questions or concerns that should arise related to todays visit.   This patient was seen by [place] with Dr Lavera Guise as a part of collaborative care agreement  Meds ordered this encounter  Medications  . etodolac (LODINE) 400 MG tablet    Sig: Take 1 tablet (400 mg total) by mouth 2 (two) times daily.    Dispense:  60 tablet    Refill:  3    Order Specific Question:   Supervising Provider    Answer:   Lavera Guise [[id number]]  . FLUoxetine (PROZAC) 10 MG capsule    Sig: Take 1 capsule (10 mg total) by mouth daily.    Dispense:  30 capsule    Refill:  2    Order Specific Question:   Supervising  Provider    Answer:   Lavera Guise [[id number]]  . ALPRAZolam (XANAX) 0.25 MG tablet    Sig: Take 1 tablet (0.25 mg total) by mouth 2 (two) times daily as needed.    Dispense:  60 tablet    Refill:  2    Order Specific Question:   Supervising  Provider    Answer:   Lavera Guise [[id number]]    Total time spent: 30 Minutes   Time spent includes review of chart, medications, test results, and follow up plan with the patient.      Dr Lavera Guise Internal medicine

## 2020-04-28 ENCOUNTER — Telehealth: Payer: Self-pay | Admitting: Obstetrics and Gynecology

## 2020-05-04 ENCOUNTER — Telehealth: Payer: Self-pay | Admitting: Obstetrics and Gynecology

## 2020-05-04 NOTE — Telephone Encounter (Signed)
-----   Message from Malachy Mood, MD sent at 03/20/2020  4:21 PM EDT ----- Regarding: Surgery Surgery Date:   LOS: observation  Surgery Booking Request Patient Full Name: LAKEETA DOBOSZ MRN: [id number]  DOB: 1971/03/08  Surgeon: Malachy Mood, MD  Requested Surgery Date and Time: 2-6 week Primary Diagnosis and Code: N93.9 AUB Secondary Diagnosis and Code: D25.0 uterine fibroids Surgical Procedure: TLH/BS and cystoscopy L&D Notification:N/A Admission Status: observation Length of Surgery: 2hrs Special Case Needs: none H&P: week of or prior (date) Phone Interview or Office Pre-Admit: pre-admit Interpreter: No Language: English Medical Clearance: No Special Scheduling Instructions: none

## 2020-05-04 NOTE — Telephone Encounter (Signed)
Patient rtn'd call to schedule TLH/BS and cystoscopy with Staebler  DOS 08/22/20  H&P 11/22 @ 8:10   Covid testing 11/26 @ 8-10:30, [address], drive up and wear mask. Advised pt to quarantine until DOS.  Pre-admit phone call appointment to be requested - date and time will be included on H&P paper work. Also all appointments will be updated on pt MyChart. Explained that this appointment has a call window. Based on the time scheduled will indicate if the call will be received within a 4 hour window before 1:00 or after.  Advised that pt may also receive calls from the hospital pharmacy and pre-service center.  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 No secondary insurance.

## 2020-05-09 NOTE — Telephone Encounter (Deleted)
Patient rtn'd call to schedule TLH/BS and cystoscopy with Staebler  DOS 08/22/20  H&P 11/22 @ 8:10   Covid testing 11/26 @ 8-10:30, [address], drive up and wear mask. Advised pt to quarantine until DOS.  Pre-admit phone call appointment to be requested - date and time will be included on H&P paper work. Also all appointments will be updated on pt MyChart. Explained that this appointment has a call window. Based on the time scheduled will indicate if the call will be received within a 4 hour window before 1:00 or after.  Advised that pt may also receive calls from the hospital pharmacy and pre-service center.  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 No secondary insurance

## 2020-05-09 NOTE — Telephone Encounter (Signed)
Patient rtn'd call to schedule TLH/BS and cystoscopy with Staebler  DOS 08/22/20  H&P 11/22 @ 8:10   Covid testing 11/26 @ 8-10:30, [address], drive up and wear mask. Advised pt to quarantine until DOS.  Pre-admit phone call appointment to be requested - date and time will be included on H&P paper work. Also all appointments will be updated on pt MyChart. Explained that this appointment has a call window. Based on the time scheduled will indicate if the call will be received within a 4 hour window before 1:00 or after.  Advised that pt may also receive calls from the hospital pharmacy and pre-service center.  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 No secondary insurance

## 2020-05-11 ENCOUNTER — Telehealth: Payer: Self-pay

## 2020-05-11 NOTE — Telephone Encounter (Signed)
lmom to confirm and screen for 05-15-20  Ov.

## 2020-05-15 ENCOUNTER — Ambulatory Visit: Payer: BC Managed Care – PPO | Admitting: Nurse Practitioner

## 2020-07-17 ENCOUNTER — Other Ambulatory Visit: Payer: Self-pay

## 2020-07-17 DIAGNOSIS — I10 Essential (primary) hypertension: Secondary | ICD-10-CM

## 2020-07-17 DIAGNOSIS — F411 Generalized anxiety disorder: Secondary | ICD-10-CM

## 2020-07-17 MED ORDER — FLUOXETINE HCL 10 MG PO CAPS: 10.0000 mg | ORAL_CAPSULE | Freq: Every day | ORAL | 2 refills | Status: DC

## 2020-07-17 MED ORDER — LISINOPRIL-HYDROCHLOROTHIAZIDE 20-12.5 MG PO TABS: 2.0000 | ORAL_TABLET | Freq: Every day | ORAL | 3 refills | Status: DC

## 2020-08-02 ENCOUNTER — Telehealth: Payer: Self-pay | Admitting: Obstetrics and Gynecology

## 2020-08-02 NOTE — Telephone Encounter (Signed)
Pt called and adv that she wanted to reschedule her surgery sch for 11/30 w Staebler. Her daughter is graduating from college and getting married.  New DOS 10/26/20  H&P 10/16/20 @ 4:10  Covid test 10/24/20 @ 8-10:30  Requested change for PAT appt

## 2020-08-14 ENCOUNTER — Encounter: Payer: BC Managed Care – PPO | Admitting: Obstetrics and Gynecology

## 2020-08-15 ENCOUNTER — Other Ambulatory Visit: Payer: BC Managed Care – PPO

## 2020-08-17 ENCOUNTER — Other Ambulatory Visit: Payer: BC Managed Care – PPO

## 2020-08-21 ENCOUNTER — Other Ambulatory Visit: Payer: BC Managed Care – PPO

## 2020-09-07 DIAGNOSIS — R1013 Epigastric pain: Secondary | ICD-10-CM | POA: Diagnosis not present

## 2020-09-07 DIAGNOSIS — R051 Acute cough: Secondary | ICD-10-CM | POA: Diagnosis not present

## 2020-09-07 DIAGNOSIS — R5383 Other fatigue: Secondary | ICD-10-CM | POA: Diagnosis not present

## 2020-09-07 DIAGNOSIS — R1012 Left upper quadrant pain: Secondary | ICD-10-CM | POA: Diagnosis not present

## 2020-09-07 DIAGNOSIS — I10 Essential (primary) hypertension: Secondary | ICD-10-CM | POA: Diagnosis not present

## 2020-09-07 DIAGNOSIS — R079 Chest pain, unspecified: Secondary | ICD-10-CM | POA: Diagnosis not present

## 2020-09-07 DIAGNOSIS — R0789 Other chest pain: Secondary | ICD-10-CM | POA: Diagnosis not present

## 2020-09-07 DIAGNOSIS — D259 Leiomyoma of uterus, unspecified: Secondary | ICD-10-CM | POA: Diagnosis not present

## 2020-09-07 DIAGNOSIS — R140 Abdominal distension (gaseous): Secondary | ICD-10-CM | POA: Diagnosis not present

## 2020-09-07 DIAGNOSIS — E785 Hyperlipidemia, unspecified: Secondary | ICD-10-CM | POA: Diagnosis not present

## 2020-09-07 DIAGNOSIS — R9431 Abnormal electrocardiogram [ECG] [EKG]: Secondary | ICD-10-CM | POA: Diagnosis not present

## 2020-09-07 DIAGNOSIS — F419 Anxiety disorder, unspecified: Secondary | ICD-10-CM | POA: Diagnosis not present

## 2020-09-08 DIAGNOSIS — D259 Leiomyoma of uterus, unspecified: Secondary | ICD-10-CM | POA: Diagnosis not present

## 2020-09-08 DIAGNOSIS — R1013 Epigastric pain: Secondary | ICD-10-CM | POA: Diagnosis not present

## 2020-10-16 ENCOUNTER — Encounter: Payer: Self-pay | Admitting: Obstetrics and Gynecology

## 2020-10-16 ENCOUNTER — Ambulatory Visit (INDEPENDENT_AMBULATORY_CARE_PROVIDER_SITE_OTHER): Payer: BC Managed Care – PPO | Admitting: Obstetrics and Gynecology

## 2020-10-16 ENCOUNTER — Other Ambulatory Visit: Payer: Self-pay

## 2020-10-16 VITALS — BP 110/68 | Ht <= 58 in | Wt 175.0 lb

## 2020-10-16 DIAGNOSIS — Z01818 Encounter for other preprocedural examination: Secondary | ICD-10-CM

## 2020-10-16 DIAGNOSIS — D252 Subserosal leiomyoma of uterus: Secondary | ICD-10-CM

## 2020-10-16 DIAGNOSIS — D251 Intramural leiomyoma of uterus: Secondary | ICD-10-CM

## 2020-10-16 NOTE — H&P (View-Only) (Signed)
Obstetrics & Gynecology Surgery H&P    Chief Complaint: Scheduled Surgery   History of Present Illness: Patient is a 50 y.o. G1P1001 presenting for scheduled TLH, BS, cystoscopy, for the treatment or further evaluation of AUB and uterine fibroids.   Prior Treatments prior to proceeding with surgery include: discussion of management option both medical, minimally invasive ([place]), and surgicaly  Preoperative Pap: 02/11/2020 Results: NILM HPV negative  Preoperative Endometrial biopsy: 03/14/2020 Findings: pathology cervical polyp and endometrial polyp, negative for hyperplasia or malignancy.  No polyp visualized on preoperative ultrasound Preoperative Ultrasound: 03/14/2020 Findings: multiple uterine fibroids larges right lower uterine segment measuring 64 x 67 x 45mm.   Review of Systems:10 point review of systems  Past Medical History:  Patient Active Problem List   Diagnosis Date Noted  . Encounter for general adult medical examination with abnormal findings 01/12/2020  . Impaired fasting glucose 10/24/2019  . Abnormal weight gain 10/24/2019  . Generalized abdominal tenderness without rebound tenderness 08/01/2019  . Irritable bowel syndrome with constipation 08/01/2019  . Sore throat 08/05/2018  . Iron deficiency anemia 08/05/2018  . Screening for breast cancer 12/17/2017  . Acute sinusitis, unspecified 09/17/2017  . Chronic mucoid otitis media, unspecified ear 09/17/2017  . Acute pharyngitis 09/17/2017  . Uterine leiomyoma 09/17/2017  . Excessive and frequent menstruation with irregular cycle 09/17/2017  . Dorsalgia, unspecified 09/17/2017  . 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 Migraine with aura, intractable, without status migrainosus 09/17/2017  . Vitamin D deficiency 09/17/2017  . Chest pain, unspecified 09/17/2017  . Pain in right shoulder 09/17/2017  . Myalgia 09/17/2017  . Other fatigue 09/17/2017  . Generalized abdominal pain 09/17/2017  . Other constipation  09/17/2017  . Gastro-esophageal reflux disease without esophagitis 09/17/2017  . Mixed hyperlipidemia 09/17/2017  . Essential hypertension 09/17/2017  . Generalized anxiety disorder 09/17/2017  . Dizziness and giddiness 09/17/2017  . Low back pain 09/17/2017  . Dysuria 09/17/2017  . Gall bladder polyp 01/07/2014    Past Surgical History:  Past Surgical History:  Procedure Laterality Date  . CESAREAN SECTION  1989  . COLONOSCOPY  2010   Dr Mechele Collin  . KNEE SURGERY Right 1999    Family History:  Family History  Problem Relation Age of Onset  . Diabetes Mother   . Hypertension Mother   . Heart disease Mother   . Hypertension Father   . Heart disease Father   . Breast cancer Neg Hx     Social History:  Social History   Socioeconomic History  . Marital status: Married    Spouse name: Not on file  . Number of children: Not on file  . Years of education: Not on file  . Highest education level: Not on file  Occupational History  . Not on file  Tobacco Use  . Smoking status: Never Smoker  . Smokeless tobacco: Never Used  Vaping Use  . Vaping Use: Never used  Substance and Sexual Activity  . Alcohol use: Yes    Comment: occasionally  . Drug use: No  . Sexual activity: Yes    Birth control/protection: None  Other Topics Concern  . Not on file  Social History Narrative  . Not on file   Social Determinants of Health   Financial Resource Strain: Not on file  Food Insecurity: Not on file  Transportation Needs: Not on file  Physical Activity: Not on file  Stress: Not on file  Social Connections: Not on file  Intimate Partner Violence: Not on file  Allergies:  Allergies  Allergen Reactions  . Bactrim [Sulfamethoxazole-Trimethoprim] Hives    Medications: Prior to Admission medications   Medication Sig Start Date End Date Taking? Authorizing Provider  acetaminophen (TYLENOL) 500 MG tablet Take 500-1,000 mg by mouth every 6 (six) hours as needed (pain).     [provider]  ALPRAZolam Duanne Moron) 0.25 MG tablet Take 1 tablet (0.25 mg total) by mouth 2 (two) times daily as needed. Patient taking differently: Take 0.25 mg by mouth at bedtime as needed for anxiety. 04/03/20   Ronnell Freshwater, NP  diclofenac (FLECTOR) 1.3 % PTCH Place 1 patch onto the skin 2 (two) times daily. Patient not taking: No sig reported 01/03/20   Ronnell Freshwater, NP  etodolac (LODINE) 400 MG tablet Take 1 tablet (400 mg total) by mouth 2 (two) times daily. Patient not taking: No sig reported 04/03/20   Ronnell Freshwater, NP  FLUoxetine (PROZAC) 10 MG capsule Take 1 capsule (10 mg total) by mouth daily. 07/17/20   Ronnell Freshwater, NP  fluticasone (FLONASE) 50 MCG/ACT nasal spray Place 2 sprays into both nostrils daily. Patient taking differently: Place 2 sprays into both nostrils daily as needed for allergies. 12/01/19   Ronnell Freshwater, NP  ibuprofen (ADVIL) 200 MG tablet Take 600 mg by mouth every 8 (eight) hours as needed (pain).    [provider]  lisinopril-hydrochlorothiazide (ZESTORETIC) 20-12.5 MG tablet Take 2 tablets by mouth daily. 07/17/20   Ronnell Freshwater, NP  lovastatin (MEVACOR) 40 MG tablet Take 40 mg by mouth 4 (four) times a week. At night 11/03/13   [provider]  Menthol, Topical Analgesic, (BENGAY EX) Apply 1 application topically 3 (three) times daily as needed (pain.).    [provider]  omeprazole (PRILOSEC) 40 MG capsule Take 1 capsule (40 mg total) by mouth daily. Patient taking differently: Take 40 mg by mouth daily as needed (acid reflux). 08/04/18   Ronnell Freshwater, NP  Phenyleph-Doxylamine-DM-APAP (ALKA-SELTZER PLS ALLERGY & CGH) 5-6.25-10-325 MG CAPS Take 1-2 capsules by mouth daily as needed (cold symptoms).    [provider]  simethicone (MYLICON) 638 MG chewable tablet Chew 125 mg by mouth every 6 (six) hours as needed for flatulence.    [provider]  SUMAtriptan (IMITREX) 50 MG  tablet Take 1 tablet (50 mg total) by mouth every 2 (two) hours as needed. Patient taking differently: Take 50 mg by mouth every 2 (two) hours as needed for migraine. 08/25/19   Ronnell Freshwater, NP    Physical Exam Vitals: There were no vitals taken for this visit. General: NAD HEENT: normocephalic, anicteric Pulmonary: No increased work of breathing Cardiovascular: RRR, distal pulses 2+ Abdomen: soft, non-tender, non-distended Genitourinary: deferred Extremities: no edema, erythema, or tenderness Neurologic: Grossly intact Psychiatric: mood appropriate, affect full  Imaging Patient Name: Joanne Maddox DOB: 14-May-1971 MRN: [id number]   ULTRASOUND REPORT  Location: [facility]  Date of Service: 03/14/2020    Indications: Uterine fibroids  Findings:  The uterus is retroverted and measures 15.2 x 9.2 x 8.1 cm. Echo texture is heterogenous with evidence of focal masses. Within the uterus are multiple suspected fibroids measuring: Fibroid 1: 64 x 67 x 56 mm intramural right, lower uterine segment Fibroid 2: 32 x 31 x 35 mm intramural posterior Fibroid 3: 56 x 47 x 51 mm subserosal fundal Fibroid 4: 22 x 20 x 21 mm anterior intramural  The Endometrium measures 4.6 mm. The endometrium is not clearly  visualized due to the fibroids.   Right Ovary measures 5.1 x 4.1 x 4.3 cm. There is a simple cyst in the right ovary measuring 46 x 30 x 30 mm Left Ovary measures 3.1 x 2.5 x 2.3 cm. It is normal in appearance. Survey of the adnexa demonstrates no adnexal masses. There is no free fluid in the cul de sac.  Impression: 1. There are multiple uterine fibroids.  2. The endometrium is not well visualized due to the fibroids.  3. There is a 4.6 cm simple cyst in the right ovary.  4. Normal appearing left ovary.   Recommendations: 1.Clinical correlation with the patient's History and Physical Exam.  Gweneth Dimitri, RT   Images reviewed.  Multiple fibroid are  visualized on today study with the largest measuring 64x67x91mm and has stayed relatively stable in size from prior study on 12/31/2017.  None of the the fibroids are submucosal.  An incidental simple appearing right ovarian cyst is visualized measuring 46 x 30 x 7mm.  Malachy Mood, MD, [facility], [facility] 03/14/2020, 10:07 AM  Assessment: 50 y.o. G1P1001 presenting for scheduled TLH, BS, cystoscopy  Plan: 1) Patient opts for definitive surgical management via hysterectomy. The risks of surgery were discussed in detail with the patient including but not limited to: bleeding which may require transfusion or reoperation; infection which may require antibiotics; injury to bowel, bladder, ureters or other surrounding organs (With a literature reported rate of urinary tract injury of 1% quoted); need for additional procedures including laparotomy; thromboembolic phenomenon, incisional problems and other postoperative/anesthesia complications.  Patient was also advised that recovery procedure generally involves an overnight stay; and the  expected recovery time after a hysterectomy being in the range of 6-8 weeks.  Likelihood of success in alleviating the patient's symptoms was discussed.  While definitive in regards to issues with menstural bleeding, pelvic pain if present preoperatively may continue and in fact worsen postoperatively.  She is aware that the procedure will render her unable to pursue childbearing in the future.   She was told that she will be contacted by our surgical scheduler regarding the time and date of her surgery; routine preoperative instructions of having nothing to eat or drink after midnight on the day prior to surgery and also coming to the hospital 1.5 hours prior to her time of surgery were also emphasized.  She was told she may be called for a preoperative appointment about a week prior to surgery and will be given further preoperative instructions  at that visit.  Routine postoperative instructions will be reviewed with the patient and her family in detail after surgery. Printed patient education handouts about the procedure was given to the patient to review at home.   2) Routine postoperative instructions were reviewed with the patient and her family in detail today including the expected length of recovery and likely postoperative course.  The patient concurred with the proposed plan, giving informed written consent for the surgery today.  Patient instructed on the importance of being NPO after midnight prior to her procedure.  If warranted preoperative prophylactic antibiotics and SCDs ordered on call to the OR to meet SCIP guidelines and adhere to recommendation laid forth in [place] Number 104 May 2009  "Antibiotic Prophylaxis for Gynecologic Procedures".     Malachy Mood, MD, [facility], [facility] 10/16/2020, 4:05 PM

## 2020-10-16 NOTE — Progress Notes (Signed)
 Obstetrics & Gynecology Surgery H&P    Chief Complaint: Scheduled Surgery   History of Present Illness: Patient is a 50 y.o. G1P1001 presenting for scheduled TLH, BS, cystoscopy, for the treatment or further evaluation of AUB and uterine fibroids.   Prior Treatments prior to proceeding with surgery include: discussion of management option both medical, minimally invasive ([place]), and surgicaly  Preoperative Pap: 02/11/2020 Results: NILM HPV negative  Preoperative Endometrial biopsy: 03/14/2020 Findings: pathology cervical polyp and endometrial polyp, negative for hyperplasia or malignancy.  No polyp visualized on preoperative ultrasound Preoperative Ultrasound: 03/14/2020 Findings: multiple uterine fibroids larges right lower uterine segment measuring 64 x 67 x 56mm.   Review of Systems:10 point review of systems  Past Medical History:  Patient Active Problem List   Diagnosis Date Noted  . Encounter for general adult medical examination with abnormal findings 01/12/2020  . Impaired fasting glucose 10/24/2019  . Abnormal weight gain 10/24/2019  . Generalized abdominal tenderness without rebound tenderness 08/01/2019  . Irritable bowel syndrome with constipation 08/01/2019  . Sore throat 08/05/2018  . Iron deficiency anemia 08/05/2018  . Screening for breast cancer 12/17/2017  . Acute sinusitis, unspecified 09/17/2017  . Chronic mucoid otitis media, unspecified ear 09/17/2017  . Acute pharyngitis 09/17/2017  . Uterine leiomyoma 09/17/2017  . Excessive and frequent menstruation with irregular cycle 09/17/2017  . Dorsalgia, unspecified 09/17/2017  . 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 Migraine with aura, intractable, without status migrainosus 09/17/2017  . Vitamin D deficiency 09/17/2017  . Chest pain, unspecified 09/17/2017  . Pain in right shoulder 09/17/2017  . Myalgia 09/17/2017  . Other fatigue 09/17/2017  . Generalized abdominal pain 09/17/2017  . Other constipation  09/17/2017  . Gastro-esophageal reflux disease without esophagitis 09/17/2017  . Mixed hyperlipidemia 09/17/2017  . Essential hypertension 09/17/2017  . Generalized anxiety disorder 09/17/2017  . Dizziness and giddiness 09/17/2017  . Low back pain 09/17/2017  . Dysuria 09/17/2017  . Gall bladder polyp 01/07/2014    Past Surgical History:  Past Surgical History:  Procedure Laterality Date  . CESAREAN SECTION  1989  . COLONOSCOPY  2010   Dr Elliott  . KNEE SURGERY Right 1999    Family History:  Family History  Problem Relation Age of Onset  . Diabetes Mother   . Hypertension Mother   . Heart disease Mother   . Hypertension Father   . Heart disease Father   . Breast cancer Neg Hx     Social History:  Social History   Socioeconomic History  . Marital status: Married    Spouse name: Not on file  . Number of children: Not on file  . Years of education: Not on file  . Highest education level: Not on file  Occupational History  . Not on file  Tobacco Use  . Smoking status: Never Smoker  . Smokeless tobacco: Never Used  Vaping Use  . Vaping Use: Never used  Substance and Sexual Activity  . Alcohol use: Yes    Comment: occasionally  . Drug use: No  . Sexual activity: Yes    Birth control/protection: None  Other Topics Concern  . Not on file  Social History Narrative  . Not on file   Social Determinants of Health   Financial Resource Strain: Not on file  Food Insecurity: Not on file  Transportation Needs: Not on file  Physical Activity: Not on file  Stress: Not on file  Social Connections: Not on file  Intimate Partner Violence: Not on file      Allergies:  Allergies  Allergen Reactions  . Bactrim [Sulfamethoxazole-Trimethoprim] Hives    Medications: Prior to Admission medications   Medication Sig Start Date End Date Taking? Authorizing Provider  acetaminophen (TYLENOL) 500 MG tablet Take 500-1,000 mg by mouth every 6 (six) hours as needed (pain).     [provider]  ALPRAZolam Duanne Moron) 0.25 MG tablet Take 1 tablet (0.25 mg total) by mouth 2 (two) times daily as needed. Patient taking differently: Take 0.25 mg by mouth at bedtime as needed for anxiety. 04/03/20   Ronnell Freshwater, NP  diclofenac (FLECTOR) 1.3 % PTCH Place 1 patch onto the skin 2 (two) times daily. Patient not taking: No sig reported 01/03/20   Ronnell Freshwater, NP  etodolac (LODINE) 400 MG tablet Take 1 tablet (400 mg total) by mouth 2 (two) times daily. Patient not taking: No sig reported 04/03/20   Ronnell Freshwater, NP  FLUoxetine (PROZAC) 10 MG capsule Take 1 capsule (10 mg total) by mouth daily. 07/17/20   Ronnell Freshwater, NP  fluticasone (FLONASE) 50 MCG/ACT nasal spray Place 2 sprays into both nostrils daily. Patient taking differently: Place 2 sprays into both nostrils daily as needed for allergies. 12/01/19   Ronnell Freshwater, NP  ibuprofen (ADVIL) 200 MG tablet Take 600 mg by mouth every 8 (eight) hours as needed (pain).    [provider]  lisinopril-hydrochlorothiazide (ZESTORETIC) 20-12.5 MG tablet Take 2 tablets by mouth daily. 07/17/20   Ronnell Freshwater, NP  lovastatin (MEVACOR) 40 MG tablet Take 40 mg by mouth 4 (four) times a week. At night 11/03/13   [provider]  Menthol, Topical Analgesic, (BENGAY EX) Apply 1 application topically 3 (three) times daily as needed (pain.).    [provider]  omeprazole (PRILOSEC) 40 MG capsule Take 1 capsule (40 mg total) by mouth daily. Patient taking differently: Take 40 mg by mouth daily as needed (acid reflux). 08/04/18   Ronnell Freshwater, NP  Phenyleph-Doxylamine-DM-APAP (ALKA-SELTZER PLS ALLERGY & CGH) 5-6.25-10-325 MG CAPS Take 1-2 capsules by mouth daily as needed (cold symptoms).    [provider]  simethicone (MYLICON) 638 MG chewable tablet Chew 125 mg by mouth every 6 (six) hours as needed for flatulence.    [provider]  SUMAtriptan (IMITREX) 50 MG  tablet Take 1 tablet (50 mg total) by mouth every 2 (two) hours as needed. Patient taking differently: Take 50 mg by mouth every 2 (two) hours as needed for migraine. 08/25/19   Ronnell Freshwater, NP    Physical Exam Vitals: There were no vitals taken for this visit. General: NAD HEENT: normocephalic, anicteric Pulmonary: No increased work of breathing Cardiovascular: RRR, distal pulses 2+ Abdomen: soft, non-tender, non-distended Genitourinary: deferred Extremities: no edema, erythema, or tenderness Neurologic: Grossly intact Psychiatric: mood appropriate, affect full  Imaging Patient Name: Joanne Maddox DOB: 14-May-1971 MRN: [id number]   ULTRASOUND REPORT  Location: [facility]  Date of Service: 03/14/2020    Indications: Uterine fibroids  Findings:  The uterus is retroverted and measures 15.2 x 9.2 x 8.1 cm. Echo texture is heterogenous with evidence of focal masses. Within the uterus are multiple suspected fibroids measuring: Fibroid 1: 64 x 67 x 56 mm intramural right, lower uterine segment Fibroid 2: 32 x 31 x 35 mm intramural posterior Fibroid 3: 56 x 47 x 51 mm subserosal fundal Fibroid 4: 22 x 20 x 21 mm anterior intramural  The Endometrium measures 4.6 mm. The endometrium is not clearly  visualized due to the fibroids.   Right Ovary measures 5.1 x 4.1 x 4.3 cm. There is a simple cyst in the right ovary measuring 46 x 30 x 30 mm Left Ovary measures 3.1 x 2.5 x 2.3 cm. It is normal in appearance. Survey of the adnexa demonstrates no adnexal masses. There is no free fluid in the cul de sac.  Impression: 1. There are multiple uterine fibroids.  2. The endometrium is not well visualized due to the fibroids.  3. There is a 4.6 cm simple cyst in the right ovary.  4. Normal appearing left ovary.   Recommendations: 1.Clinical correlation with the patient's History and Physical Exam.  Gweneth Dimitri, RT   Images reviewed.  Multiple fibroid are  visualized on today study with the largest measuring 64x67x91mm and has stayed relatively stable in size from prior study on 12/31/2017.  None of the the fibroids are submucosal.  An incidental simple appearing right ovarian cyst is visualized measuring 46 x 30 x 7mm.  Malachy Mood, MD, [facility], [facility] 03/14/2020, 10:07 AM  Assessment: 50 y.o. G1P1001 presenting for scheduled TLH, BS, cystoscopy  Plan: 1) Patient opts for definitive surgical management via hysterectomy. The risks of surgery were discussed in detail with the patient including but not limited to: bleeding which may require transfusion or reoperation; infection which may require antibiotics; injury to bowel, bladder, ureters or other surrounding organs (With a literature reported rate of urinary tract injury of 1% quoted); need for additional procedures including laparotomy; thromboembolic phenomenon, incisional problems and other postoperative/anesthesia complications.  Patient was also advised that recovery procedure generally involves an overnight stay; and the  expected recovery time after a hysterectomy being in the range of 6-8 weeks.  Likelihood of success in alleviating the patient's symptoms was discussed.  While definitive in regards to issues with menstural bleeding, pelvic pain if present preoperatively may continue and in fact worsen postoperatively.  She is aware that the procedure will render her unable to pursue childbearing in the future.   She was told that she will be contacted by our surgical scheduler regarding the time and date of her surgery; routine preoperative instructions of having nothing to eat or drink after midnight on the day prior to surgery and also coming to the hospital 1.5 hours prior to her time of surgery were also emphasized.  She was told she may be called for a preoperative appointment about a week prior to surgery and will be given further preoperative instructions  at that visit.  Routine postoperative instructions will be reviewed with the patient and her family in detail after surgery. Printed patient education handouts about the procedure was given to the patient to review at home.   2) Routine postoperative instructions were reviewed with the patient and her family in detail today including the expected length of recovery and likely postoperative course.  The patient concurred with the proposed plan, giving informed written consent for the surgery today.  Patient instructed on the importance of being NPO after midnight prior to her procedure.  If warranted preoperative prophylactic antibiotics and SCDs ordered on call to the OR to meet SCIP guidelines and adhere to recommendation laid forth in [place] Number 104 May 2009  "Antibiotic Prophylaxis for Gynecologic Procedures".     Malachy Mood, MD, [facility], [facility] 10/16/2020, 4:05 PM

## 2020-10-17 ENCOUNTER — Encounter
Admission: RE | Admit: 2020-10-17 | Discharge: 2020-10-17 | Disposition: A | Discharge status: Home / Self Care | Payer: BC Managed Care – PPO | Source: Ambulatory Visit | Attending: Obstetrics and Gynecology | Admitting: Obstetrics and Gynecology

## 2020-10-17 NOTE — Patient Instructions (Addendum)
Your procedure is scheduled on: Thursday 10-26-2020  Report to the Registration Desk on the 1st floor of the [facility]. To find out your arrival time, please call [phone] between 1PM - 3PM on: Wednesday 10-25-2020  REMEMBER: Instructions that are not followed completely may result in serious medical risk, up to and including death; or upon the discretion of your surgeon and anesthesiologist your surgery may need to be rescheduled.  Do not eat food or drink liquids after midnight the night before surgery.  No gum chewing, lozengers or hard candies.  TAKE THESE MEDICATIONS THE MORNING OF SURGERY WITH A SIP OF WATER: FLUOXETINE OMPRAZOLE (take one the night before and one on the morning of surgery - helps to prevent nausea after surgery.)  Follow recommendations from Cardiologist, Pulmonologist or PCP regarding stopping Aspirin, Coumadin, Plavix, Eliquis, Pradaxa, or Pletal.  One week prior to surgery: Stop Anti-inflammatories (NSAIDS) such as Advil, Aleve, Ibuprofen, Motrin, Naproxen, Naprosyn and Aspirin based products such as Excedrin, Goodys Powder, BC Powder.  USE TYLENOL ONLY Stop ANY OVER THE COUNTER supplements until after surgery. (However, you may continue taking Vitamin D, Vitamin B, and multivitamin up until the day before surgery.)  No Alcohol for 24 hours before or after surgery.  No Smoking including e-cigarettes for 24 hours prior to surgery.  No chewable tobacco products for at least 6 hours prior to surgery.  No nicotine patches on the day of surgery.  Do not use any "recreational" drugs for at least a week prior to your surgery.  Please be advised that the combination of cocaine and anesthesia may have negative outcomes, up to and including death. If you test positive for cocaine, your surgery will be cancelled.  On the morning of surgery brush your teeth with toothpaste and water, you may rinse your mouth with mouthwash if you wish. Do not swallow any  toothpaste or mouthwash.  Do not wear jewelry, make-up, hairpins, clips or nail polish.  Do not wear lotions, powders, or perfumes  NO DEODORANT .   Do not shave body from the neck down 48 hours prior to surgery just in case you cut yourself which could leave a site for infection.  Also, freshly shaved skin may become irritated if using the CHG soap.  Contact lenses, hearing aids and dentures may not be worn into surgery.  Do not bring valuables to the hospital. [facility] is not responsible for any missing/lost belongings or valuables.   Use CHG Soap as directed on instruction sheet.  Notify your doctor if there is any change in your medical condition (cold, fever, infection).  Wear comfortable clothing (specific to your surgery type) to the hospital.  Plan for stool softeners for home use; pain medications have a tendency to cause constipation. You can also help prevent constipation by eating foods high in fiber such as fruits and vegetables and drinking plenty of fluids as your diet allows.  After surgery, you can help prevent lung complications by doing breathing exercises.  Take deep breaths and cough every 1-2 hours. Your doctor may order a device called an Incentive Spirometer to help you take deep breaths. When coughing or sneezing, hold a pillow firmly against your incision with both hands. This is called "splinting." Doing this helps protect your incision. It also decreases belly discomfort.  If you are being admitted to the hospital overnight, [place]   If you are being discharged the day of surgery, you will not  be allowed to drive home. You will need a responsible adult (18 years or older) to drive you home and stay with you that night.   If you are taking public transportation, you will need to have a responsible adult (18 years or older) with you. Please confirm with your physician that it is acceptable to use public transportation.    Please call the [facility] at [phone] if you have any questions about these instructions.  Visitation Policy:  Patients undergoing a surgery or procedure may have one family member or support person with them as long as that person is not COVID-19 positive or experiencing its symptoms.  That person may remain in the waiting area during the procedure.  Inpatient Visitation:    Visiting hours are 7 a.m. to 8 p.m. Patients will be allowed one visitor. The visitor may change daily. The visitor must pass COVID-19 screenings, use hand sanitizer when entering and exiting the patient's room and wear a mask at all times, including in the patient's room. Patients must also wear a mask when staff or their visitor are in the room. Masking is required regardless of vaccination status. Systemwide, no visitors 17 or younger.

## 2020-10-19 ENCOUNTER — Other Ambulatory Visit: Payer: Self-pay

## 2020-10-19 ENCOUNTER — Encounter
Admission: RE | Admit: 2020-10-19 | Discharge: 2020-10-19 | Disposition: A | Discharge status: Home / Self Care | Payer: BC Managed Care – PPO | Source: Ambulatory Visit | Attending: Obstetrics and Gynecology | Admitting: Obstetrics and Gynecology

## 2020-10-19 DIAGNOSIS — Z01812 Encounter for preprocedural laboratory examination: Secondary | ICD-10-CM | POA: Insufficient documentation

## 2020-10-19 LAB — BASIC METABOLIC PANEL
Anion gap: 9 (ref 5–15)
BUN: 13 mg/dL (ref 6–20)
CO2: 26 mmol/L (ref 22–32)
Calcium: 8.9 mg/dL (ref 8.9–10.3)
Chloride: 101 mmol/L (ref 98–111)
Creatinine, Ser: 0.69 mg/dL (ref 0.44–1.00)
GFR, Estimated: >60 mL/min (ref >60)
Glucose, Bld: 104 mg/dL — ABNORMAL HIGH (ref 70–99)
Potassium: 3.7 mmol/L (ref 3.5–5.1)
Sodium: 136 mmol/L (ref 135–145)

## 2020-10-19 LAB — CBC
HCT: 34.4 % — ABNORMAL LOW (ref 36.0–46.0)
Hemoglobin: 11.8 g/dL — ABNORMAL LOW (ref 12.0–15.0)
MCH: 28.4 pg (ref 26.0–34.0)
MCHC: 34.3 g/dL (ref 30.0–36.0)
MCV: 82.7 fL (ref 80.0–100.0)
Platelets: 371 10*3/uL (ref 150–400)
RBC: 4.16 MIL/uL (ref 3.87–5.11)
RDW: 14.7 % (ref 11.5–15.5)
WBC: 5.4 10*3/uL (ref 4.0–10.5)
nRBC: 0 % (ref 0.0–0.2)

## 2020-10-19 LAB — TYPE AND SCREEN
ABO/RH(D): O POS
Antibody Screen: NEGATIVE

## 2020-10-20 ENCOUNTER — Telehealth: Payer: Self-pay

## 2020-10-20 NOTE — Telephone Encounter (Signed)
I called pt to inform that due to covid restrictions at [facility] regarding pt in bed, her surgery will have to be rescheduled to a later date. I adv that they are making decisions weekly. After giving her dates that [facility] is in [place], she decided to move surgery to 2/17.   I contacted PAT and rescheduled her covid test to 2/15. Pt is aware.

## 2020-10-25 ENCOUNTER — Other Ambulatory Visit: Payer: BC Managed Care – PPO

## 2020-11-03 ENCOUNTER — Ambulatory Visit: Payer: BC Managed Care – PPO | Admitting: Obstetrics and Gynecology

## 2020-11-06 ENCOUNTER — Other Ambulatory Visit: Payer: Self-pay

## 2020-11-06 ENCOUNTER — Telehealth: Payer: Self-pay

## 2020-11-06 DIAGNOSIS — F411 Generalized anxiety disorder: Secondary | ICD-10-CM

## 2020-11-06 MED ORDER — FLUOXETINE HCL 10 MG PO CAPS: 10.0000 mg | ORAL_CAPSULE | Freq: Every day | ORAL | 0 refills | Status: DC

## 2020-11-06 NOTE — Telephone Encounter (Signed)
Pt advised need appt for refills

## 2020-11-06 NOTE — Telephone Encounter (Signed)
Pt calling; is scheduled for surgery c [facility] this coming Thurs; will she stay overnight in the hosp?  States that is what [facility] wanted.  [phone]

## 2020-11-07 ENCOUNTER — Other Ambulatory Visit
Admission: RE | Admit: 2020-11-07 | Discharge: 2020-11-07 | Disposition: A | Discharge status: Home / Self Care | Payer: BC Managed Care – PPO | Source: Ambulatory Visit | Attending: Obstetrics and Gynecology | Admitting: Obstetrics and Gynecology

## 2020-11-07 ENCOUNTER — Other Ambulatory Visit: Payer: Self-pay

## 2020-11-07 DIAGNOSIS — Z01812 Encounter for preprocedural laboratory examination: Secondary | ICD-10-CM | POA: Insufficient documentation

## 2020-11-07 DIAGNOSIS — Z20822 Contact with and (suspected) exposure to covid-19: Secondary | ICD-10-CM | POA: Insufficient documentation

## 2020-11-07 LAB — SARS CORONAVIRUS 2 (TAT 6-24 HRS): SARS Coronavirus 2: NEGATIVE

## 2020-11-07 NOTE — Telephone Encounter (Signed)
Called pt back to adv that her case is scheduled as "outpatient in bed". This means she will be held under observation for up to 23 hours following her surgery. She wanted to confirm this was still the case as surgery had to be postponed due to Covid restrictions previously.

## 2020-11-09 ENCOUNTER — Other Ambulatory Visit: Payer: Self-pay

## 2020-11-09 ENCOUNTER — Ambulatory Visit: Payer: BC Managed Care – PPO | Admitting: Anesthesiology

## 2020-11-09 ENCOUNTER — Encounter
Admission: RE | Disposition: A | Discharge status: Home / Self Care | Payer: Self-pay | Source: Home / Self Care | Attending: Obstetrics and Gynecology

## 2020-11-09 ENCOUNTER — Encounter: Payer: Self-pay | Admitting: Obstetrics and Gynecology

## 2020-11-09 ENCOUNTER — Observation Stay
Admission: RE | Admit: 2020-11-09 | Discharge: 2020-11-10 | Disposition: A | Discharge status: Home / Self Care | Payer: BC Managed Care – PPO | Attending: Obstetrics and Gynecology | Admitting: Obstetrics and Gynecology

## 2020-11-09 DIAGNOSIS — Z79899 Other long term (current) drug therapy: Secondary | ICD-10-CM | POA: Insufficient documentation

## 2020-11-09 DIAGNOSIS — D259 Leiomyoma of uterus, unspecified: Secondary | ICD-10-CM | POA: Insufficient documentation

## 2020-11-09 DIAGNOSIS — Z90711 Acquired absence of uterus with remaining cervical stump: Secondary | ICD-10-CM | POA: Diagnosis present

## 2020-11-09 DIAGNOSIS — R102 Pelvic and perineal pain unspecified side: Secondary | ICD-10-CM

## 2020-11-09 DIAGNOSIS — D250 Submucous leiomyoma of uterus: Secondary | ICD-10-CM | POA: Diagnosis not present

## 2020-11-09 DIAGNOSIS — D252 Subserosal leiomyoma of uterus: Secondary | ICD-10-CM

## 2020-11-09 DIAGNOSIS — I10 Essential (primary) hypertension: Secondary | ICD-10-CM | POA: Insufficient documentation

## 2020-11-09 DIAGNOSIS — D251 Intramural leiomyoma of uterus: Secondary | ICD-10-CM

## 2020-11-09 DIAGNOSIS — N939 Abnormal uterine and vaginal bleeding, unspecified: Secondary | ICD-10-CM | POA: Diagnosis not present

## 2020-11-09 HISTORY — PX: CYSTOSCOPY: SHX5120

## 2020-11-09 HISTORY — PX: TOTAL LAPAROSCOPIC HYSTERECTOMY WITH SALPINGECTOMY: SHX6742

## 2020-11-09 LAB — TYPE AND SCREEN
ABO/RH(D): O POS
Antibody Screen: NEGATIVE

## 2020-11-09 SURGERY — HYSTERECTOMY, TOTAL, LAPAROSCOPIC, WITH SALPINGECTOMY
Anesthesia: General

## 2020-11-09 MED ORDER — HEMOSTATIC AGENTS (NO CHARGE) OPTIME
TOPICAL | Status: DC | PRN
  Administered 2020-11-09: 1 via TOPICAL

## 2020-11-09 MED ORDER — CHLORHEXIDINE GLUCONATE 0.12 % MT SOLN: 15.0000 mL | Freq: Once | OROMUCOSAL | Status: AC

## 2020-11-09 MED ORDER — ROCURONIUM BROMIDE 100 MG/10ML IV SOLN
INTRAVENOUS | Status: DC | PRN
  Administered 2020-11-09: 30 mg via INTRAVENOUS
  Administered 2020-11-09: 20 mg via INTRAVENOUS
  Administered 2020-11-09: 50 mg via INTRAVENOUS

## 2020-11-09 MED ORDER — ACETAMINOPHEN 10 MG/ML IV SOLN
INTRAVENOUS | Status: AC
  Filled 2020-11-09: qty 100

## 2020-11-09 MED ORDER — CHLORHEXIDINE GLUCONATE 0.12 % MT SOLN
OROMUCOSAL | Status: AC
  Administered 2020-11-09: 15 mL via OROMUCOSAL
  Filled 2020-11-09: qty 15

## 2020-11-09 MED ORDER — FENTANYL CITRATE (PF) 100 MCG/2ML IJ SOLN
25.0000 ug | INTRAMUSCULAR | Status: DC | PRN
  Administered 2020-11-09: 50 ug via INTRAVENOUS

## 2020-11-09 MED ORDER — PROPOFOL 10 MG/ML IV BOLUS
INTRAVENOUS | Status: AC
  Filled 2020-11-09: qty 20

## 2020-11-09 MED ORDER — FENTANYL CITRATE (PF) 100 MCG/2ML IJ SOLN
INTRAMUSCULAR | Status: AC
  Administered 2020-11-09: 50 ug via INTRAVENOUS
  Filled 2020-11-09: qty 2

## 2020-11-09 MED ORDER — LIDOCAINE HCL (CARDIAC) PF 100 MG/5ML IV SOSY
PREFILLED_SYRINGE | INTRAVENOUS | Status: DC | PRN
  Administered 2020-11-09: 60 mg via INTRAVENOUS

## 2020-11-09 MED ORDER — SCOPOLAMINE 1 MG/3DAYS TD PT72
MEDICATED_PATCH | TRANSDERMAL | Status: DC | PRN
  Administered 2020-11-09: 1 via TRANSDERMAL

## 2020-11-09 MED ORDER — MORPHINE SULFATE (PF) 2 MG/ML IV SOLN
1.0000 mg | INTRAVENOUS | Status: DC | PRN
  Administered 2020-11-09 (×2): 2 mg via INTRAVENOUS
  Filled 2020-11-09 (×2): qty 1

## 2020-11-09 MED ORDER — OXYCODONE-ACETAMINOPHEN 5-325 MG PO TABS
1.0000 | ORAL_TABLET | ORAL | Status: DC | PRN
  Administered 2020-11-09 – 2020-11-10 (×2): 1 via ORAL
  Filled 2020-11-09 (×2): qty 1

## 2020-11-09 MED ORDER — CEFAZOLIN SODIUM-DEXTROSE 2-4 GM/100ML-% IV SOLN
INTRAVENOUS | Status: AC
  Filled 2020-11-09: qty 100

## 2020-11-09 MED ORDER — ONDANSETRON HCL 4 MG/2ML IJ SOLN
INTRAMUSCULAR | Status: DC | PRN
  Administered 2020-11-09: 4 mg via INTRAVENOUS

## 2020-11-09 MED ORDER — SIMETHICONE 80 MG PO CHEW
80.0000 mg | CHEWABLE_TABLET | Freq: Four times a day (QID) | ORAL | Status: DC | PRN
  Administered 2020-11-09: 80 mg via ORAL

## 2020-11-09 MED ORDER — DEXTROSE-NACL 5-0.45 % IV SOLN: INTRAVENOUS | Status: DC

## 2020-11-09 MED ORDER — ACETAMINOPHEN 10 MG/ML IV SOLN
INTRAVENOUS | Status: DC | PRN
  Administered 2020-11-09: 1000 mg via INTRAVENOUS

## 2020-11-09 MED ORDER — DEXAMETHASONE SODIUM PHOSPHATE 10 MG/ML IJ SOLN
INTRAMUSCULAR | Status: DC | PRN
  Administered 2020-11-09: 10 mg via INTRAVENOUS

## 2020-11-09 MED ORDER — PROMETHAZINE HCL 25 MG/ML IJ SOLN
6.2500 mg | INTRAMUSCULAR | Status: DC | PRN
Start: 2020-11-09 — End: 2020-11-09

## 2020-11-09 MED ORDER — FENTANYL CITRATE (PF) 100 MCG/2ML IJ SOLN
INTRAMUSCULAR | Status: DC | PRN
  Administered 2020-11-09 (×3): 50 ug via INTRAVENOUS

## 2020-11-09 MED ORDER — IBUPROFEN 600 MG PO TABS
600.0000 mg | ORAL_TABLET | Freq: Four times a day (QID) | ORAL | Status: DC | PRN
  Administered 2020-11-09 – 2020-11-10 (×3): 600 mg via ORAL
  Filled 2020-11-09 (×3): qty 1

## 2020-11-09 MED ORDER — SIMETHICONE 80 MG PO CHEW
80.0000 mg | CHEWABLE_TABLET | Freq: Four times a day (QID) | ORAL | Status: DC | PRN
  Administered 2020-11-09: 80 mg via ORAL
  Filled 2020-11-09 (×2): qty 1

## 2020-11-09 MED ORDER — MIDAZOLAM HCL 2 MG/2ML IJ SOLN
INTRAMUSCULAR | Status: AC
  Filled 2020-11-09: qty 2

## 2020-11-09 MED ORDER — ORAL CARE MOUTH RINSE: 15.0000 mL | Freq: Once | OROMUCOSAL | Status: AC

## 2020-11-09 MED ORDER — PROPOFOL 10 MG/ML IV BOLUS
INTRAVENOUS | Status: DC | PRN
  Administered 2020-11-09: 130 mg via INTRAVENOUS

## 2020-11-09 MED ORDER — ONDANSETRON HCL 4 MG PO TABS: 4.0000 mg | ORAL_TABLET | Freq: Four times a day (QID) | ORAL | Status: DC | PRN

## 2020-11-09 MED ORDER — FENTANYL CITRATE (PF) 100 MCG/2ML IJ SOLN
INTRAMUSCULAR | Status: AC
  Filled 2020-11-09: qty 2

## 2020-11-09 MED ORDER — MENTHOL 3 MG MT LOZG
1.0000 | LOZENGE | OROMUCOSAL | Status: DC | PRN
  Filled 2020-11-09: qty 9

## 2020-11-09 MED ORDER — SUGAMMADEX SODIUM 200 MG/2ML IV SOLN
INTRAVENOUS | Status: DC | PRN
  Administered 2020-11-09: 200 mg via INTRAVENOUS

## 2020-11-09 MED ORDER — OXYCODONE HCL 5 MG/5ML PO SOLN
5.0000 mg | Freq: Once | ORAL | Status: DC | PRN
Start: 2020-11-09 — End: 2020-11-09

## 2020-11-09 MED ORDER — OXYCODONE HCL 5 MG PO TABS: 5.0000 mg | ORAL_TABLET | Freq: Once | ORAL | Status: DC | PRN

## 2020-11-09 MED ORDER — LACTATED RINGERS IV SOLN: INTRAVENOUS | Status: DC | PRN

## 2020-11-09 MED ORDER — ONDANSETRON HCL 4 MG/2ML IJ SOLN: 4.0000 mg | Freq: Four times a day (QID) | INTRAMUSCULAR | Status: DC | PRN

## 2020-11-09 MED ORDER — LACTATED RINGERS IV SOLN: INTRAVENOUS | Status: DC

## 2020-11-09 MED ORDER — SCOPOLAMINE 1 MG/3DAYS TD PT72
MEDICATED_PATCH | TRANSDERMAL | Status: AC
  Filled 2020-11-09: qty 1

## 2020-11-09 MED ORDER — BUPIVACAINE HCL 0.5 % IJ SOLN
INTRAMUSCULAR | Status: DC | PRN
  Administered 2020-11-09: 9 mL
  Administered 2020-11-09: 20 mL

## 2020-11-09 MED ORDER — CEFAZOLIN SODIUM-DEXTROSE 2-4 GM/100ML-% IV SOLN
2.0000 g | INTRAVENOUS | Status: AC
  Administered 2020-11-09: 2 g via INTRAVENOUS

## 2020-11-09 MED ORDER — POVIDONE-IODINE 10 % EX SWAB
2.0000 "application " | Freq: Once | CUTANEOUS | Status: AC
  Administered 2020-11-09: 2 via TOPICAL

## 2020-11-09 SURGICAL SUPPLY — 63 items
ADH SKN CLS APL DERMABOND .7 (GAUZE/BANDAGES/DRESSINGS) ×2
APL PRP STRL LF DISP 70% ISPRP (MISCELLANEOUS) ×2
APL SRG 38 LTWT LNG FL B (MISCELLANEOUS) ×2
APPLICATOR ARISTA FLEXITIP XL (MISCELLANEOUS) ×1 IMPLANT
BAG DRN RND TRDRP ANRFLXCHMBR (UROLOGICAL SUPPLIES) ×2
BAG URINE DRAIN 2000ML AR STRL (UROLOGICAL SUPPLIES) ×3 IMPLANT
BLADE SURG SZ11 CARB STEEL (BLADE) ×3 IMPLANT
CATH FOLEY 2WAY  5CC 16FR (CATHETERS) ×3
CATH FOLEY 2WAY 5CC 16FR (CATHETERS) ×2
CATH URTH 16FR FL 2W BLN LF (CATHETERS) ×2 IMPLANT
CELL SAVER LIPIGURD (MISCELLANEOUS) ×2 IMPLANT
CHLORAPREP W/TINT 26 (MISCELLANEOUS) ×3 IMPLANT
COVER WAND RF STERILE (DRAPES) ×3 IMPLANT
DEFOGGER SCOPE WARMER CLEARIFY (MISCELLANEOUS) ×3 IMPLANT
DERMABOND ADVANCED (GAUZE/BANDAGES/DRESSINGS) ×1
DERMABOND ADVANCED .7 DNX12 (GAUZE/BANDAGES/DRESSINGS) ×2 IMPLANT
DEVICE RETRIEVAL ALEXIS 14 (MISCELLANEOUS) IMPLANT
DEVICE SUTURE ENDOST 10MM (ENDOMECHANICALS) ×3 IMPLANT
EXTRT SYSTEM ALEXIS 14CM (MISCELLANEOUS) ×3
GAUZE 4X4 16PLY RFD (DISPOSABLE) ×3 IMPLANT
GLOVE SURG ENC MOIS LTX SZ7 (GLOVE) ×12 IMPLANT
GLOVE SURG UNDER LTX SZ7.5 (GLOVE) ×3 IMPLANT
GOWN STRL REUS W/ TWL LRG LVL3 (GOWN DISPOSABLE) ×4 IMPLANT
GOWN STRL REUS W/ TWL XL LVL3 (GOWN DISPOSABLE) ×2 IMPLANT
GOWN STRL REUS W/TWL LRG LVL3 (GOWN DISPOSABLE) ×12
GOWN STRL REUS W/TWL XL LVL3 (GOWN DISPOSABLE) ×3
GRASPER SUT TROCAR 14GX15 (MISCELLANEOUS) ×3 IMPLANT
HEMOSTAT ARISTA ABSORB 3G PWDR (HEMOSTASIS) ×1 IMPLANT
IRRIGATION STRYKERFLOW (MISCELLANEOUS) ×2 IMPLANT
IRRIGATOR STRYKERFLOW (MISCELLANEOUS) ×3
IV LACTATED RINGERS 1000ML (IV SOLUTION) ×3 IMPLANT
IV NS 1000ML (IV SOLUTION) ×3
IV NS 1000ML BAXH (IV SOLUTION) ×2 IMPLANT
KIT PINK PAD W/HEAD ARE REST (MISCELLANEOUS) ×3
KIT PINK PAD W/HEAD ARM REST (MISCELLANEOUS) ×2 IMPLANT
LABEL OR SOLS (LABEL) ×3 IMPLANT
MANIFOLD NEPTUNE II (INSTRUMENTS) ×3 IMPLANT
MANIPULATOR VCARE STD CRV RETR (MISCELLANEOUS) ×1 IMPLANT
NS IRRIG 1000ML POUR BTL (IV SOLUTION) ×3 IMPLANT
NS IRRIG 500ML POUR BTL (IV SOLUTION) ×3 IMPLANT
OCCLUDER COLPOPNEUMO (BALLOONS) ×1 IMPLANT
PACK GYN LAPAROSCOPIC (MISCELLANEOUS) ×3 IMPLANT
PAD OB MATERNITY 4.3X12.25 (PERSONAL CARE ITEMS) ×3 IMPLANT
PAD PREP 24X41 OB/GYN DISP (PERSONAL CARE ITEMS) ×3 IMPLANT
RETRACTOR WOUND ALXS 18CM SML (MISCELLANEOUS) IMPLANT
RTRCTR WOUND ALEXIS O 18CM SML (MISCELLANEOUS) ×3
SCISSORS METZENBAUM CVD 33 (INSTRUMENTS) ×3 IMPLANT
SET CYSTO W/LG BORE CLAMP LF (SET/KITS/TRAYS/PACK) ×3 IMPLANT
SHEARS HARMONIC ACE PLUS 36CM (ENDOMECHANICALS) ×3 IMPLANT
SLEEVE ENDOPATH XCEL 5M (ENDOMECHANICALS) ×3 IMPLANT
STRAP SAFETY 5IN WIDE (MISCELLANEOUS) ×3 IMPLANT
SURGILUBE 2OZ TUBE FLIPTOP (MISCELLANEOUS) ×3 IMPLANT
SUT ENDO VLOC 180-0-8IN (SUTURE) ×3 IMPLANT
SUT MNCRL 4-0 (SUTURE) ×6
SUT MNCRL 4-0 27XMFL (SUTURE) ×4
SUT VIC AB 0 CT1 36 (SUTURE) ×3 IMPLANT
SUT VICRYL 0 AB UR-6 (SUTURE) ×1 IMPLANT
SUTURE MNCRL 4-0 27XMF (SUTURE) ×4 IMPLANT
SYR 10ML LL (SYRINGE) ×3 IMPLANT
SYR 50ML LL SCALE MARK (SYRINGE) ×3 IMPLANT
TROCAR ENDO BLADELESS 11MM (ENDOMECHANICALS) ×3 IMPLANT
TROCAR XCEL NON-BLD 5MMX100MML (ENDOMECHANICALS) ×4 IMPLANT
TUBING EVAC SMOKE HEATED PNEUM (TUBING) ×3 IMPLANT

## 2020-11-09 NOTE — Op Note (Signed)
Preoperative Diagnosis: 1) 50 y.o. with uterine fibroids 2) Pelvic pain 3) Abnormal uterine bleeding  Postoperative Diagnosis: 1) 50 y.o. with uterine fibroids 2) Pelvic pain 3) Abnormal uterine bleeding  Operation Performed: Total laparoscopic hysterectomy, bilateral salpingectomy, and cystoscopy  Indication: 50 year old with fibroid uterus, pelvic pain, and abnormal uterine bleeding  Surgeon: Malachy Mood, MD  Assistant: Adrian Prows, MD this surgery required a high level surgical assistant with none other readily available  Anesthesia: General  Preoperative Antibiotics: 2g Ancef  Estimated Blood Loss: 50 mL  Drains or Tubes: none  Implants: none  Specimens Removed: Uterus, and bilateral fallopian tubes  Complications: none  Intraoperative Findings: Grossly enlarged 16 week size fibroid uterus, broad, with dominant right sided fibroid.  Normal tubes and ovaries.    Patient Condition: stable  Procedure in Detail:  Patient was taken to the operating room where she was administered general anesthesia.  She was positioned in the dorsal lithotomy position utilizing Allen stirups, prepped and draped in the usual sterile fashion.  Prior to proceeding with procedure a time out was performed.  Attention was turned to the patient's pelvis.  An indwelling foley catheter was placed to decompress the patient's bladder.  An operative speculum was placed to allow visualization of the cervix.  The anterior lip of the cervix was grasped with a single tooth tenaculum, and a medium V-care uterine manipulator was placed to allow manipulation of the uterus.  The operative speculum and single tooth tenaculum were then removed.  Attention was turned to the patient's abdomen.  An orogastric tube was placed to suction. The  Left upper quadrant was infiltrated with 1% Sensorcaine, before making a stab incision using an 11 blade scalpel.  A 73mm Excel trocar was then used to gain direct entry  into the peritoneal cavity utilizing the camera to visualize progress of the trocar during placement.  Once peritoneal entry had been achieved, insufflation was started and pneumoperitoneum established at a pressure of 21mmHg.    One left and one right lower quadrant site were then injected with 1% Sensorcaine and a stab incision was made using an 11 blade scalpel. Two additional 53mm Excel trocars were placed through these incisions under direct visualization.  A spot 2cm above the umbilicus was used to place an 89mm excel Trocar.  General inspection of the abdomen revealed the above noted findings.  Some filmy right lower quadrant omental adhesions were taken down using the harmonic scalpel.  The right tube was identified and grasped at its fimbriated end.  The right  tube was transected from its attachments to the ovary and mesosalpinx using a 56mm Harmonic scalpel.  The utero ovarian ligament was identified ligated and transected using the Harmonic scalpel. The round ligament was then likewise ligated and transected by Dr. Gilman Schmidt.  At this point secondary to limited mobility and visibility in the uterus the decision was made to switch the the left adnexal structures.    The left  tube was transected from its attachments to the ovary and mesosalpinx using a 60mm Harmonic scalpel.  The utero ovarian ligament was identified ligated and transected using the Harmonic scalpel. The round ligament was transected using the Harmonic.  The anterior leaf of the broad ligament was dissected down to the level of the internal cervical os and a bladder flap was started.  The posterior leaf of the broad ligament was dissected down to the utero-sacral ligament.  The uterine artery was skeletonized before being ligated and transected using  the Harmonic scalpel with cephelad pressure applied to the V-care device to assure lateralization of the ureter.  A bite was then taken with Harmonic medial to transected portio of uterine artery  to further lateralize the ureter and vessel off the V-care cup.  Attention was turned to the right broad ligament and this was transected down to meet the previously started bladder flap.  The uterine artery was skeletonized, cauterized, and transected..  At this point the specimen was transected off the cervix using the harmonic scalpel.  Pedicles were inspected and noted to be hemostatic.  Ureters were visualized well clear of the site of dissection.  The supraumbilical 32TF port site was extended using an 11 blade scalpel.  A laparoscopic retrieval bag was placed through he incision and a 50mm Hassan trocar was placed.  The specimen placed within the laparoscopic retrieval bag which was brought to the skin.  A small Alexis was placed within the bag and the specimen was morcellated within the bag using an 11 blade scalpel.  A reinspection of the pedicles revealed these to still be hemostatic.  3g of Arista was applied to all pedicles.    Pneumoperitoneum was evacuated and trocars were removed.  The supraumbilical trocar site was closed using a 0 Vicryl on a UR-6 needle.  All trocar sites were then dressed with surgical skin glue.   The indwelling foley catheter and V-care were removed.  Cystoscopy was performed noting and intact bladder dome as well as brisk efflux of urine from bother ureteral orifices.  The cystoscopy was removed and the indwelling foley catheter was not replaced.  Sponge needle and instrument counts were correct time two.  The patient tolerated the procedure well and was taken to the recovery room in stable condition.

## 2020-11-09 NOTE — Anesthesia Preprocedure Evaluation (Addendum)
Anesthesia Evaluation  Patient identified by MRN, date of birth, ID band Patient awake    Reviewed: Allergy & Precautions, H&P , NPO status , Patient's Chart, lab work & pertinent test results  History of Anesthesia Complications Negative for: history of anesthetic complications  Airway Mallampati: II  TM Distance: >3 FB Neck ROM: full    Dental  (+) Teeth Intact   Pulmonary neg pulmonary ROS, neg sleep apnea, neg COPD,    breath sounds clear to auscultation       Cardiovascular hypertension, (-) angina(-) Past MI and (-) Cardiac Stents (-) dysrhythmias  Rhythm:regular Rate:Normal     Neuro/Psych  Headaches, PSYCHIATRIC DISORDERS Anxiety    GI/Hepatic Neg liver ROS, GERD  Controlled,  Endo/Other  negative endocrine ROS  Renal/GU      Musculoskeletal   Abdominal   Peds  Hematology  (+) Blood dyscrasia, anemia ,   Anesthesia Other Findings Past Medical History: No date: Anxiety 2010: Colon polyp No date: GERD (gastroesophageal reflux disease) No date: Hypercholesteremia No date: Hyperlipidemia No date: Hypertension No date: Migraine  Past Surgical History: 1989: CESAREAN SECTION 2010: COLONOSCOPY     Comment:  Dr Vira Agar 1999: [place]; Right  BMI    Body Mass Index: 36.79 kg/m      Reproductive/Obstetrics negative OB ROS                          Anesthesia Physical Anesthesia Plan  ASA: II  Anesthesia Plan: General ETT   Post-op Pain Management:    Induction:   PONV Risk Score and Plan: Ondansetron, Dexamethasone, Midazolam and Treatment may vary due to age or medical condition  Airway Management Planned:   Additional Equipment:   Intra-op Plan:   Post-operative Plan:   Informed Consent: I have reviewed the patients History and Physical, chart, labs and discussed the procedure including the risks, benefits and alternatives for the proposed anesthesia with the  patient or authorized representative who has indicated his/her understanding and acceptance.     Dental Advisory Given  Plan Discussed with: Anesthesiologist, CRNA and Surgeon  Anesthesia Plan Comments:       Anesthesia Quick Evaluation

## 2020-11-09 NOTE — Interval H&P Note (Signed)
History and Physical Interval Note:  11/09/2020 7:25 AM  Joanne Maddox  has presented today for surgery, with the diagnosis of D25.0 Uterine fibroids.  The various methods of treatment have been discussed with the patient and family. After consideration of risks, benefits and other options for treatment, the patient has consented to  Procedure(s): TOTAL LAPAROSCOPIC HYSTERECTOMY WITH BILATERAL SALPINGECTOMY (Bilateral) CYSTOSCOPY (N/A) as a surgical intervention.  The patient's history has been reviewed, patient examined, no change in status, stable for surgery.  I have reviewed the patient's chart and labs.  Questions were answered to the patient's satisfaction.     Malachy Mood

## 2020-11-09 NOTE — Anesthesia Procedure Notes (Signed)
Procedure Name: Intubation Date/Time: 11/09/2020 7:37 AM Performed by: Philbert Riser, CRNA Pre-anesthesia Checklist: Patient identified, Emergency Drugs available, Suction available and Patient being monitored Patient Re-evaluated:Patient Re-evaluated prior to induction Oxygen Delivery Method: Circle system utilized Preoxygenation: Pre-oxygenation with 100% oxygen Induction Type: IV induction Ventilation: Mask ventilation without difficulty Tube type: Oral Tube size: 7.0 mm Number of attempts: 1 Airway Equipment and Method: Stylet and Oral airway Placement Confirmation: ETT inserted through vocal cords under direct vision,  positive ETCO2 and breath sounds checked- equal and bilateral Secured at: 21 cm Tube secured with: Tape Dental Injury: Teeth and Oropharynx as per pre-operative assessment

## 2020-11-09 NOTE — Transfer of Care (Signed)
Immediate Anesthesia Transfer of Care Note  Patient: [name]  Procedure(s) Performed: TOTAL LAPAROSCOPIC HYSTERECTOMY WITH BILATERAL SALPINGECTOMY (Bilateral ) CYSTOSCOPY (N/A )  Patient Location: PACU  Anesthesia Type:General  Level of Consciousness: awake, alert  and oriented  Airway & Oxygen Therapy: Patient Spontanous Breathing and Patient connected to face mask oxygen  Post-op Assessment: Report given to RN and Post -op Vital signs reviewed and stable  Post vital signs: Reviewed and stable  Last Vitals:  Vitals Value Taken Time  BP 114/70 11/09/20 1100  Temp 36.3 C 11/09/20 1058  Pulse 92 11/09/20 1102  Resp 17 11/09/20 1102  SpO2 100 % 11/09/20 1102  Vitals shown include unvalidated device data.  Last Pain:  Vitals:   11/09/20 1058  TempSrc:   PainSc: Asleep         Complications: No complications documented.

## 2020-11-09 NOTE — Anesthesia Postprocedure Evaluation (Signed)
Anesthesia Post Note  Patient: Joanne Maddox  Procedure(s) Performed: TOTAL LAPAROSCOPIC HYSTERECTOMY WITH BILATERAL SALPINGECTOMY (Bilateral ) CYSTOSCOPY (N/A )  Patient location during evaluation: PACU Anesthesia Type: General Level of consciousness: awake and alert Pain management: pain level controlled Vital Signs Assessment: post-procedure vital signs reviewed and stable Respiratory status: spontaneous breathing, nonlabored ventilation and respiratory function stable Cardiovascular status: blood pressure returned to baseline and stable Postop Assessment: no apparent nausea or vomiting Anesthetic complications: no   No complications documented.   Last Vitals:  Vitals:   11/09/20 1225 11/09/20 1330  BP: 118/70 111/77  Pulse: 72 71  Resp: 18 18  Temp: 36.8 C (!) 35.9 C  SpO2: 100% 99%    Last Pain:  Vitals:   11/09/20 1330  TempSrc: Oral  PainSc:                  Alphonsus Sias

## 2020-11-10 ENCOUNTER — Encounter: Payer: Self-pay | Admitting: Obstetrics and Gynecology

## 2020-11-10 DIAGNOSIS — I10 Essential (primary) hypertension: Secondary | ICD-10-CM | POA: Diagnosis not present

## 2020-11-10 DIAGNOSIS — R102 Pelvic and perineal pain: Secondary | ICD-10-CM | POA: Diagnosis not present

## 2020-11-10 DIAGNOSIS — N939 Abnormal uterine and vaginal bleeding, unspecified: Secondary | ICD-10-CM | POA: Diagnosis not present

## 2020-11-10 DIAGNOSIS — Z79899 Other long term (current) drug therapy: Secondary | ICD-10-CM | POA: Diagnosis not present

## 2020-11-10 DIAGNOSIS — D259 Leiomyoma of uterus, unspecified: Secondary | ICD-10-CM | POA: Diagnosis not present

## 2020-11-10 LAB — BASIC METABOLIC PANEL
Anion gap: 8 (ref 5–15)
BUN: 10 mg/dL (ref 6–20)
CO2: 25 mmol/L (ref 22–32)
Calcium: 8.4 mg/dL — ABNORMAL LOW (ref 8.9–10.3)
Chloride: 101 mmol/L (ref 98–111)
Creatinine, Ser: 0.69 mg/dL (ref 0.44–1.00)
GFR, Estimated: >60 mL/min (ref >60)
Glucose, Bld: 139 mg/dL — ABNORMAL HIGH (ref 70–99)
Potassium: 3.5 mmol/L (ref 3.5–5.1)
Sodium: 134 mmol/L — ABNORMAL LOW (ref 135–145)

## 2020-11-10 LAB — CBC
HCT: 27.2 % — ABNORMAL LOW (ref 36.0–46.0)
Hemoglobin: 9.1 g/dL — ABNORMAL LOW (ref 12.0–15.0)
MCH: 28.2 pg (ref 26.0–34.0)
MCHC: 33.5 g/dL (ref 30.0–36.0)
MCV: 84.2 fL (ref 80.0–100.0)
Platelets: 303 10*3/uL (ref 150–400)
RBC: 3.23 MIL/uL — ABNORMAL LOW (ref 3.87–5.11)
RDW: 14.2 % (ref 11.5–15.5)
WBC: 9.7 10*3/uL (ref 4.0–10.5)
nRBC: 0 % (ref 0.0–0.2)

## 2020-11-10 LAB — SURGICAL PATHOLOGY

## 2020-11-10 MED ORDER — IBUPROFEN 600 MG PO TABS: 600.0000 mg | ORAL_TABLET | Freq: Four times a day (QID) | ORAL | 0 refills | Status: DC | PRN

## 2020-11-10 MED ORDER — OXYCODONE-ACETAMINOPHEN 5-325 MG PO TABS: 1.0000 | ORAL_TABLET | ORAL | 0 refills | Status: DC | PRN

## 2020-11-10 NOTE — Progress Notes (Signed)
Patient discharged, discharge instructions given.  All questions answered.  Patient transported by auxiliary.

## 2020-11-14 ENCOUNTER — Other Ambulatory Visit: Payer: Self-pay | Admitting: Obstetrics and Gynecology

## 2020-11-14 ENCOUNTER — Telehealth: Payer: Self-pay

## 2020-11-14 ENCOUNTER — Ambulatory Visit
Admission: RE | Admit: 2020-11-14 | Discharge: 2020-11-14 | Disposition: A | Discharge status: Home / Self Care | Payer: BC Managed Care – PPO | Source: Ambulatory Visit | Attending: Obstetrics and Gynecology | Admitting: Obstetrics and Gynecology

## 2020-11-14 ENCOUNTER — Other Ambulatory Visit: Payer: Self-pay

## 2020-11-14 DIAGNOSIS — M7989 Other specified soft tissue disorders: Secondary | ICD-10-CM

## 2020-11-14 NOTE — Telephone Encounter (Signed)
[facility] patient

## 2020-11-14 NOTE — Telephone Encounter (Signed)
Pt calling; had hyst c [facility] last Thurs; c/o legs are swollen, feels hot and clammy.  Nl?  [phone]

## 2020-11-14 NOTE — Telephone Encounter (Signed)
Called pt to adv to go to [facility] now. Radiology will be working her into their schedule. [facility] number was given for u/s follow up.

## 2020-11-15 ENCOUNTER — Ambulatory Visit (INDEPENDENT_AMBULATORY_CARE_PROVIDER_SITE_OTHER): Payer: BC Managed Care – PPO | Admitting: Hospice and Palliative Medicine

## 2020-11-15 ENCOUNTER — Other Ambulatory Visit: Payer: Self-pay | Admitting: Hospice and Palliative Medicine

## 2020-11-15 ENCOUNTER — Encounter: Payer: Self-pay | Admitting: Hospice and Palliative Medicine

## 2020-11-15 VITALS — BP 140/72 | HR 85 | Temp 97.5°F | Resp 16 | Ht <= 58 in | Wt 181.0 lb

## 2020-11-15 DIAGNOSIS — F411 Generalized anxiety disorder: Secondary | ICD-10-CM | POA: Diagnosis not present

## 2020-11-15 DIAGNOSIS — J300 Vasomotor rhinitis: Secondary | ICD-10-CM

## 2020-11-15 DIAGNOSIS — E782 Mixed hyperlipidemia: Secondary | ICD-10-CM

## 2020-11-15 DIAGNOSIS — I10 Essential (primary) hypertension: Secondary | ICD-10-CM | POA: Diagnosis not present

## 2020-11-15 DIAGNOSIS — Z1231 Encounter for screening mammogram for malignant neoplasm of breast: Secondary | ICD-10-CM | POA: Diagnosis not present

## 2020-11-15 DIAGNOSIS — Z79899 Other long term (current) drug therapy: Secondary | ICD-10-CM

## 2020-11-15 DIAGNOSIS — K219 Gastro-esophageal reflux disease without esophagitis: Secondary | ICD-10-CM

## 2020-11-15 DIAGNOSIS — N951 Menopausal and female climacteric states: Secondary | ICD-10-CM

## 2020-11-15 DIAGNOSIS — G43009 Migraine without aura, not intractable, without status migrainosus: Secondary | ICD-10-CM

## 2020-11-15 LAB — POCT URINE DRUG SCREEN
POC Amphetamine UR: NOT DETECTED
POC BENZODIAZEPINES UR: NOT DETECTED
POC Barbiturate UR: NOT DETECTED
POC Cocaine UR: NOT DETECTED
POC Ecstasy UR: NOT DETECTED
POC Marijuana UR: NOT DETECTED
POC Methadone UR: NOT DETECTED
POC Methamphetamine UR: NOT DETECTED
POC Opiate Ur: NOT DETECTED
POC Oxycodone UR: POSITIVE — AB
POC PHENCYCLIDINE UR: NOT DETECTED
POC TRICYCLICS UR: NOT DETECTED

## 2020-11-15 MED ORDER — FLUTICASONE PROPIONATE 50 MCG/ACT NA SUSP: 2.0000 | Freq: Every day | NASAL | 3 refills | Status: AC | PRN

## 2020-11-15 MED ORDER — LOVASTATIN 40 MG PO TABS: 40.0000 mg | ORAL_TABLET | ORAL | 1 refills | Status: DC

## 2020-11-15 MED ORDER — OMEPRAZOLE 40 MG PO CPDR: 40.0000 mg | DELAYED_RELEASE_CAPSULE | Freq: Every day | ORAL | 1 refills | Status: AC | PRN

## 2020-11-15 MED ORDER — ALPRAZOLAM 0.25 MG PO TABS: 0.2500 mg | ORAL_TABLET | Freq: Every evening | ORAL | 1 refills | Status: DC | PRN

## 2020-11-15 MED ORDER — FLUOXETINE HCL 20 MG PO TABS: 20.0000 mg | ORAL_TABLET | Freq: Every day | ORAL | 3 refills | Status: DC

## 2020-11-15 MED ORDER — SUMATRIPTAN SUCCINATE 50 MG PO TABS: 50.0000 mg | ORAL_TABLET | ORAL | 0 refills | Status: DC | PRN

## 2020-11-15 MED ORDER — LISINOPRIL-HYDROCHLOROTHIAZIDE 20-12.5 MG PO TABS: 2.0000 | ORAL_TABLET | Freq: Every day | ORAL | 3 refills | Status: DC

## 2020-11-15 NOTE — Telephone Encounter (Signed)
Not covered.

## 2020-11-15 NOTE — Progress Notes (Signed)
[facility] [place], [place] [address]  Internal MEDICINE  Office Visit Note  Patient Name: Joanne Maddox  [id number]  [id number]  Date of Service: 11/16/2020  Chief Complaint  Patient presents with  . Follow-up  . Gastroesophageal Reflux  . Hyperlipidemia  . Hypertension  . Medication Refill    HPI Patient is here for routine follow-up Recently had total hysterectomy due to uterine fibroids, procedure without complications, a few days being home post procedure she developed bilateral lower extremity swelling and pain--GYN ordered venous US-negative for evidence of acute DVT Swelling has significantly improved Since surgery she feels more on edge and easily irritated Has also had one migraine since surgery, was out of Imitrex but resolved with rest and OTC medications  Having hot flashes, more frequently in the evening, not affecting her sleep at this time Scheduled for follow-up with GYN next week  Current Medication: Outpatient Encounter Medications as of 11/15/2020  Medication Sig  . FLUoxetine (PROZAC) 20 MG tablet Take 1 tablet (20 mg total) by mouth daily.  Marland Kitchen ibuprofen (ADVIL) 600 MG tablet Take 1 tablet (600 mg total) by mouth every 6 (six) hours as needed for mild pain or cramping (pain).  Marland Kitchen oxyCODONE-acetaminophen (PERCOCET/ROXICET) 5-325 MG tablet Take 1-2 tablets by mouth every 4 (four) hours as needed for moderate pain.  . SUMAtriptan (IMITREX) 50 MG tablet Take 1 tablet (50 mg total) by mouth every 2 (two) hours as needed for migraine. May repeat in 2 hours if headache persists or recurs.  . [DISCONTINUED] ALPRAZolam (XANAX) 0.25 MG tablet Take 1 tablet (0.25 mg total) by mouth 2 (two) times daily as needed. (Patient taking differently: Take 0.25 mg by mouth at bedtime as needed for anxiety.)  . [DISCONTINUED] FLUoxetine (PROZAC) 10 MG capsule Take 1 capsule (10 mg total) by mouth daily.  . [DISCONTINUED] fluticasone (FLONASE) 50 MCG/ACT  nasal spray Place 2 sprays into both nostrils daily. (Patient taking differently: Place 2 sprays into both nostrils daily as needed for allergies.)  . [DISCONTINUED] lisinopril-hydrochlorothiazide (ZESTORETIC) 20-12.5 MG tablet Take 2 tablets by mouth daily.  . [DISCONTINUED] lovastatin (MEVACOR) 40 MG tablet Take 40 mg by mouth 4 (four) times a week. At night  . [DISCONTINUED] Menthol, Topical Analgesic, (BENGAY EX) Apply 1 application topically 3 (three) times daily as needed (pain.).  . [DISCONTINUED] omeprazole (PRILOSEC) 40 MG capsule Take 1 capsule (40 mg total) by mouth daily. (Patient taking differently: Take 40 mg by mouth daily as needed (acid reflux).)  . [DISCONTINUED] simethicone (MYLICON) 240 MG chewable tablet Chew 125 mg by mouth every 6 (six) hours as needed for flatulence.  . ALPRAZolam (XANAX) 0.25 MG tablet Take 1 tablet (0.25 mg total) by mouth at bedtime as needed for anxiety.  . fluticasone (FLONASE) 50 MCG/ACT nasal spray Place 2 sprays into both nostrils daily as needed for allergies.  Marland Kitchen lisinopril-hydrochlorothiazide (ZESTORETIC) 20-12.5 MG tablet Take 2 tablets by mouth daily.  Marland Kitchen lovastatin (MEVACOR) 40 MG tablet Take 1 tablet (40 mg total) by mouth 4 (four) times a week. At night  . omeprazole (PRILOSEC) 40 MG capsule Take 1 capsule (40 mg total) by mouth daily as needed (acid reflux).   No facility-administered encounter medications on file as of 11/15/2020.    Surgical History: Past Surgical History:  Procedure Laterality Date  . CESAREAN SECTION  1989  . COLONOSCOPY  2010   Dr Vira Agar  . CYSTOSCOPY N/A 11/09/2020   Procedure: CYSTOSCOPY;  Surgeon: Malachy Mood, MD;  Location: [facility] ORS;  Service: Gynecology;  Laterality: N/A;  . KNEE SURGERY Right 1999  . TOTAL LAPAROSCOPIC HYSTERECTOMY WITH SALPINGECTOMY Bilateral 11/09/2020   Procedure: TOTAL LAPAROSCOPIC HYSTERECTOMY WITH BILATERAL SALPINGECTOMY;  Surgeon: Malachy Mood, MD;  Location: [facility] ORS;   Service: Gynecology;  Laterality: Bilateral;    Medical History: Past Medical History:  Diagnosis Date  . Anxiety   . Colon polyp 2010  . GERD (gastroesophageal reflux disease)   . Hypercholesteremia   . Hyperlipidemia   . Hypertension   . Migraine     Family History: Family History  Problem Relation Age of Onset  . Diabetes Mother   . Hypertension Mother   . Heart disease Mother   . Hypertension Father   . Heart disease Father   . Breast cancer Neg Hx     Social History   Socioeconomic History  . Marital status: Married    Spouse name: Not on file  . Number of children: Not on file  . Years of education: Not on file  . Highest education level: Not on file  Occupational History  . Not on file  Tobacco Use  . Smoking status: Never Smoker  . Smokeless tobacco: Never Used  Vaping Use  . Vaping Use: Never used  Substance and Sexual Activity  . Alcohol use: Yes    Comment: occasionally  . Drug use: No  . Sexual activity: Yes    Birth control/protection: None  Other Topics Concern  . Not on file  Social History Narrative  . Not on file   Social Determinants of Health   Financial Resource Strain: Not on file  Food Insecurity: Not on file  Transportation Needs: Not on file  Physical Activity: Not on file  Stress: Not on file  Social Connections: Not on file  Intimate Partner Violence: Not on file      Review of Systems  Constitutional: Positive for fatigue. Negative for chills and diaphoresis.  HENT: Negative for ear pain, postnasal drip and sinus pressure.   Eyes: Negative for photophobia, discharge, redness, itching and visual disturbance.  Respiratory: Negative for cough, shortness of breath and wheezing.   Cardiovascular: Negative for chest pain, palpitations and leg swelling.  Gastrointestinal: Negative for abdominal pain, constipation, diarrhea, nausea and vomiting.  Genitourinary: Negative for dysuria and flank pain.  Musculoskeletal: Negative  for arthralgias, back pain, gait problem and neck pain.  Skin: Negative for color change.  Allergic/Immunologic: Negative for environmental allergies and food allergies.  Neurological: Positive for headaches. Negative for dizziness.       Hot flashes  Hematological: Does not bruise/bleed easily.  Psychiatric/Behavioral: Positive for behavioral problems (depression). Negative for agitation and hallucinations. The patient is nervous/anxious.     Vital Signs: BP 140/72   Pulse 85   Temp (!) 97.5 F (36.4 C)   Resp 16   Ht 4\' 9"  (1.448 m)   Wt 181 lb (82.1 kg)   SpO2 100%   BMI 39.17 kg/m    Physical Exam Vitals reviewed.  Constitutional:      Appearance: Normal appearance. She is obese.  Cardiovascular:     Rate and Rhythm: Normal rate and regular rhythm.     Pulses: Normal pulses.     Heart sounds: Normal heart sounds.  Pulmonary:     Effort: Pulmonary effort is normal.     Breath sounds: Normal breath sounds.  Abdominal:     General: Abdomen is flat.     Palpations: Abdomen is soft.  Musculoskeletal:  General: Normal range of motion.     Cervical back: Normal range of motion.  Skin:    General: Skin is warm.  Neurological:     General: No focal deficit present.     Mental Status: She is alert and oriented to person, place, and time. Mental status is at baseline.  Psychiatric:        Mood and Affect: Mood normal.        Behavior: Behavior normal.        Thought Content: Thought content normal.        Judgment: Judgment normal.    Assessment/Plan: 1. Essential hypertension BP and HR well controlled today - lisinopril-hydrochlorothiazide (ZESTORETIC) 20-12.5 MG tablet; Take 2 tablets by mouth daily.  Dispense: 60 tablet; Refill: 3  2. Encounter for screening mammogram for malignant neoplasm of breast - MM Digital Screening; Future  3. Vasomotor rhinitis Symptoms remain well controlled, requesting refills - fluticasone (FLONASE) 50 MCG/ACT nasal spray;  Place 2 sprays into both nostrils daily as needed for allergies.  Dispense: 9.9 mL; Refill: 3  4. Generalized anxiety disorder Symptoms waxing and waning at this time post hysterectomy Continue with current therapy at this time, will closely monitor and adjust therapy based on symptoms - FLUoxetine (PROZAC) 20 MG tablet; Take 1 tablet (20 mg total) by mouth daily.  Dispense: 30 tablet; Refill: 3 - ALPRAZolam (XANAX) 0.25 MG tablet; Take 1 tablet (0.25 mg total) by mouth at bedtime as needed for anxiety.  Dispense: 30 tablet; Refill: 1  5. Gastro-esophageal reflux disease without esophagitis Well controlled, requesting refills - omeprazole (PRILOSEC) 40 MG capsule; Take 1 capsule (40 mg total) by mouth daily as needed (acid reflux).  Dispense: 90 capsule; Refill: 1  6. Encounter for long-term (current) use of medications - POCT Urine Drug Screen  7. Menopausal vasomotor syndrome Advised to discuss with GYN--closely monitor and address symptom management  8. Mixed hyperlipidemia Will need updated labs--continue with statin Further vascular studies needed - lovastatin (MEVACOR) 40 MG tablet; Take 1 tablet (40 mg total) by mouth 4 (four) times a week. At night  Dispense: 30 tablet; Refill: 1  9. Migraine without aura and without status migrainosus, not intractable Migraines have been well controlled, one episode since hysterectomy, refill Imitrex Address preventative therapy options if migraine episodes increase - SUMAtriptan (IMITREX) 50 MG tablet; Take 1 tablet (50 mg total) by mouth every 2 (two) hours as needed for migraine. May repeat in 2 hours if headache persists or recurs.  Dispense: 10 tablet; Refill: 0  General Counseling: sofie schendel understanding of the findings of todays visit and agrees with plan of treatment. I have discussed any further diagnostic evaluation that may be needed or ordered today. We also reviewed her medications today. she has been encouraged to call the  office with any questions or concerns that should arise related to todays visit.    Orders Placed This Encounter  Procedures  . MM Digital Screening  . POCT Urine Drug Screen    Meds ordered this encounter  Medications  . FLUoxetine (PROZAC) 20 MG tablet    Sig: Take 1 tablet (20 mg total) by mouth daily.    Dispense:  30 tablet    Refill:  3  . ALPRAZolam (XANAX) 0.25 MG tablet    Sig: Take 1 tablet (0.25 mg total) by mouth at bedtime as needed for anxiety.    Dispense:  30 tablet    Refill:  1  . lisinopril-hydrochlorothiazide (ZESTORETIC) 20-12.5 MG  tablet    Sig: Take 2 tablets by mouth daily.    Dispense:  60 tablet    Refill:  3  . lovastatin (MEVACOR) 40 MG tablet    Sig: Take 1 tablet (40 mg total) by mouth 4 (four) times a week. At night    Dispense:  30 tablet    Refill:  1  . fluticasone (FLONASE) 50 MCG/ACT nasal spray    Sig: Place 2 sprays into both nostrils daily as needed for allergies.    Dispense:  9.9 mL    Refill:  3  . omeprazole (PRILOSEC) 40 MG capsule    Sig: Take 1 capsule (40 mg total) by mouth daily as needed (acid reflux).    Dispense:  90 capsule    Refill:  1  . SUMAtriptan (IMITREX) 50 MG tablet    Sig: Take 1 tablet (50 mg total) by mouth every 2 (two) hours as needed for migraine. May repeat in 2 hours if headache persists or recurs.    Dispense:  10 tablet    Refill:  0    Time spent: 30 Minutes Time spent includes review of chart, medications, test results and follow-up plan with the patient.  This patient was seen by Theodoro Grist AGNP-C in Collaboration with Dr Lavera Guise as a part of collaborative care agreement     Tanna Furry. Marsden Zaino AGNP-C Internal medicine

## 2020-11-16 ENCOUNTER — Encounter: Payer: Self-pay | Admitting: Hospice and Palliative Medicine

## 2020-11-19 NOTE — Discharge Summary (Signed)
Physician Discharge Summary  Patient ID: Joanne Maddox MRN: [id number] DOB/AGE: 1971-08-23 50 y.o.  Admit date: 11/09/2020 Discharge date: 11/19/2020  Admission Diagnoses: Scheduled hysterectomy  Discharge Diagnoses:  Active Problems:   S/P laparoscopic supracervical hysterectomy   Abnormal uterine bleeding (AUB)   Pelvic pain in female   Discharged Condition: stable  Hospital Course: 50 y.o. G1P1001 presenting for scheduled hysterectomy secondary to AUB and uterine fibroids.  Procedure went uncomplicated, supracervical approach chosen secondary to bladder adhesions.  The patient did well overnight into POD#1.  At the time of discharge she was ambulating, voiding, tolerating po, and reporting good pain control on po analgesics.  She remained hemodynamically stable and afebrile throughout admission.  Renal function looked good.  Consults: None  Significant Diagnostic Studies:  Recent Results (from the past 2160 hour(s))  Type and screen [place]     Status: None   Collection Time: 10/19/20  9:30 AM  Result Value Ref Range   ABO/RH(D) O POS    Antibody Screen NEG    Sample Expiration 11/02/2020,2359    Extend sample reason      NO TRANSFUSIONS OR PREGNANCY IN THE PAST 3 MONTHS Performed at [facility], [address] [place], [place] [address]   CBC     Status: Abnormal   Collection Time: 10/19/20  9:30 AM  Result Value Ref Range   WBC 5.4 4.0 - 10.5 K/uL   RBC 4.16 3.87 - 5.11 MIL/uL   Hemoglobin 11.8 (L) 12.0 - 15.0 g/dL   HCT 34.4 (L) 36.0 - 46.0 %   MCV 82.7 80.0 - 100.0 fL   MCH 28.4 26.0 - 34.0 pg   MCHC 34.3 30.0 - 36.0 g/dL   RDW 14.7 11.5 - 15.5 %   Platelets 371 150 - 400 K/uL   nRBC 0.0 0.0 - 0.2 %    Comment: Performed at [facility], [address] [place], [place] [address]  Basic metabolic panel     Status: Abnormal   Collection Time: 10/19/20  9:30 AM  Result Value Ref Range   Sodium 136 135 - 145 mmol/L    Potassium 3.7 3.5 - 5.1 mmol/L   Chloride 101 98 - 111 mmol/L   CO2 26 22 - 32 mmol/L   Glucose, Bld 104 (H) 70 - 99 mg/dL    Comment: Glucose reference range applies only to samples taken after fasting for at least 8 hours.   BUN 13 6 - 20 mg/dL   Creatinine, Ser 0.69 0.44 - 1.00 mg/dL   Calcium 8.9 8.9 - 10.3 mg/dL   GFR, Estimated >60 >60 mL/min    Comment: (NOTE) Calculated using the CKD-EPI Creatinine Equation (2021)    Anion gap 9 5 - 15    Comment: Performed at [facility], [address] [place], [place] [address]  SARS CORONAVIRUS 2 (TAT 6-24 HRS) Nasopharyngeal Nasopharyngeal Swab     Status: None   Collection Time: 11/07/20  9:09 AM   Specimen: Nasopharyngeal Swab  Result Value Ref Range   SARS Coronavirus 2 NEGATIVE NEGATIVE    Comment: (NOTE) SARS-CoV-2 target nucleic acids are NOT DETECTED.  The SARS-CoV-2 RNA is generally detectable in upper and lower respiratory specimens during the acute phase of infection. Negative results do not preclude SARS-CoV-2 infection, do not rule out co-infections with other pathogens, and should not be used as the sole basis for treatment or other patient management decisions. Negative results must be combined with clinical  observations, patient history, and epidemiological information. The expected result is Negative.  Fact Sheet for Patients: SugarRoll.be  Fact Sheet for Healthcare Providers: https://www.woods-mathews.com/  This test is not yet approved or cleared by the [place] FDA and  has been authorized for detection and/or diagnosis of SARS-CoV-2 by FDA under an Emergency Use Authorization (EUA). This EUA will remain  in effect (meaning this test can be used) for the duration of the COVID-19 declaration under Se ction 564(b)(1) of the Act, 21 U.S.C. section 360bbb-3(b)(1), unless the authorization is terminated or revoked sooner.  Performed at [facility], [address] [place], [place] [address]   Type and screen [place]     Status: None   Collection Time: 11/09/20  6:40 AM  Result Value Ref Range   ABO/RH(D) O POS    Antibody Screen NEG    Sample Expiration      11/12/2020,2359 Performed at [facility], [address] [place], [place] [address]   Surgical pathology     Status: None   Collection Time: 11/09/20 10:14 AM  Result Value Ref Range   SURGICAL PATHOLOGY      SURGICAL PATHOLOGY CASE: [id number] PATIENT: [facility] Surgical Pathology Report     Specimen Submitted: A. Uterus, bilateral fallopian tubes  Clinical History: D25.0 uterine fibroids      DIAGNOSIS: A. UTERUS WITH BILATERAL FALLOPIAN TUBES; SUPRACERVICAL HYSTERECTOMY WITH BILATERAL SALPINGECTOMY: - ENDOMETRIUM:      - INACTIVE.  NEGATIVE FOR ATYPIA / EIN AND MALIGNANCY. - MYOMETRIUM:      - ADENOMYOSIS, LEIOMYOMATA. - BILATERAL FALLOPIAN TUBES:      - NO SIGNIFICANT PATHOLOGIC ALTERATION.   GROSS DESCRIPTION: A. Labeled: Uterus, bilateral fallopian tubes Received: In formalin Collection time: 10:14 AM on 11/09/2020 Placed into formalin time: 10:24 AM on 11/09/2020 Weight: 496 grams Dimensions: Received morcellated, pink-red pieces in aggregate measure 23.5 x 15 x 6 cm Serosa: Pink-red and relatively smooth Cervix: Not identified Endometrial cavity: Cannot be grossly ascertained      Thickness -0.1 cm      Other findings -the visible endometri um is unremarkable Myometrium:     Thickness -up to 4 cm     Other findings -within the myometrium are multiple pale white rubbery bulging intramural and subserosal nodules ranging from 0.5 cm to the largest piece measuring 5.7 x 4.5 x 3.7 cm, each of which have a whorled pattern Adnexa: The bilateral ovaries are not received      One fallopian tube-weighs 2 g           Measurements -7.2 cm in length x 0.5 cm in diameter           Other findings  -unremarkable portion of fallopian tube with fimbria, sectioned      Other fallopian tube-weighs 2 g            Measurements - 7 cm in length x 0.6 cm in diameter           Other findings -ragged portion of fallopian tube with fimbria, sectioned Other comments: None  Block summary: 1-4-endometrium 5-6-myometrial nodules 7-8-entire longitudinally sectioned fimbriated end of first fallopian tube and representative cross-sections 9-10- entire longitudinally sectioned fimbriated end of second fallopian tube and representativ e cross-sections  Final Diagnosis performed by Betsy Pries, MD.   Electronically signed 11/10/2020 9:38:03AM The electronic signature indicates that the named Attending Pathologist has evaluated the specimen Technical component performed at [facility], [address] [place],  [place] [address] Lab: [phone] Dir: Rush Farmer, MD, MMM  Professional component performed at [facility], [facility], [address], [place], [place] [address] Lab: [phone] Dir: Dellia Nims. Rubinas, MD   CBC     Status: Abnormal   Collection Time: 11/10/20  5:27 AM  Result Value Ref Range   WBC 9.7 4.0 - 10.5 K/uL   RBC 3.23 (L) 3.87 - 5.11 MIL/uL   Hemoglobin 9.1 (L) 12.0 - 15.0 g/dL   HCT 27.2 (L) 36.0 - 46.0 %   MCV 84.2 80.0 - 100.0 fL   MCH 28.2 26.0 - 34.0 pg   MCHC 33.5 30.0 - 36.0 g/dL   RDW 14.2 11.5 - 15.5 %   Platelets 303 150 - 400 K/uL   nRBC 0.0 0.0 - 0.2 %    Comment: Performed at [facility], [address], [place], [place] [address]  Basic metabolic panel     Status: Abnormal   Collection Time: 11/10/20  5:27 AM  Result Value Ref Range   Sodium 134 (L) 135 - 145 mmol/L   Potassium 3.5 3.5 - 5.1 mmol/L   Chloride 101 98 - 111 mmol/L   CO2 25 22 - 32 mmol/L   Glucose, Bld 139 (H) 70 - 99 mg/dL    Comment: Glucose reference range applies only to samples taken after fasting for at least 8 hours.   BUN 10 6 - 20 mg/dL    Creatinine, Ser 0.69 0.44 - 1.00 mg/dL   Calcium 8.4 (L) 8.9 - 10.3 mg/dL   GFR, Estimated >60 >60 mL/min    Comment: (NOTE) Calculated using the CKD-EPI Creatinine Equation (2021)    Anion gap 8 5 - 15    Comment: Performed at [facility], [address] [place], [place] [address]  POCT Urine Drug Screen     Status: Abnormal   Collection Time: 11/15/20 10:44 AM  Result Value Ref Range   POC Methamphetamine UR None Detected None Detected   POC Opiate Ur None Detected None Detected   POC Barbiturate UR None Detected None Detected   POC Amphetamine UR None Detected None Detected   POC Oxycodone UR Positive (A) None Detected   POC Cocaine UR None Detected None Detected   POC Ecstasy UR None Detected None Detected   POC TRICYCLICS UR None Detected None Detected   POC PHENCYCLIDINE UR None Detected None Detected   POC Marijuana UR None Detected None Detected   POC Methadone UR None Detected None Detected   POC BENZODIAZEPINES UR None Detected None Detected   URINE TEMPERATURE     POC DRUG SCREEN OXIDANTS URINE     POC SPECIFIC GRAVITY URINE     POC PH URINE     Methylenedioxyamphetamine      Treatments: surgery: laparoscopic supracervical hysterectomy, bilateral salpingectomy, cystoscopy  Discharge Exam: Blood pressure 130/74, pulse 81, temperature 97.8 F (36.6 C), temperature source Oral, resp. rate 18, height 4\' 9"  (1.448 m), weight 77.1 kg, SpO2 96 %. General appearance: alert, appears stated age and no distress Resp: clear to auscultation bilaterally GI: soft, non-tender; bowel sounds normal; no masses,  no organomegaly Extremities: extremities normal, atraumatic, no cyanosis or edema Incision/Wound: D/C/I  Disposition: Discharge disposition: 01-Home or Self Care       Discharge Instructions    Call MD for:   Complete by: As directed    Heavy vaginal bleeding greater than 1 pad an hour   Call MD for:  difficulty breathing,  headache or visual  disturbances   Complete by: As directed    Call MD for:  extreme fatigue   Complete by: As directed    Call MD for:  hives   Complete by: As directed    Call MD for:  persistant dizziness or light-headedness   Complete by: As directed    Call MD for:  persistant nausea and vomiting   Complete by: As directed    Call MD for:  redness, tenderness, or signs of infection (pain, swelling, redness, odor or green/yellow discharge around incision site)   Complete by: As directed    Call MD for:  severe uncontrolled pain   Complete by: As directed    Call MD for:  temperature >100.4   Complete by: As directed    Diet general   Complete by: As directed    Discharge wound care:   Complete by: As directed    You may apply a light dressing for minor discharge from the incision or to keep waistbands of clothing from rubbing.  You may shower, use soap on your incision.  Avoid baths or soaking the incision in the first 6 weeks following your surgery..   Driving restriction   Complete by: As directed    Avoid driving for at least 2 weeks or while taking prescription narcotics.   Lifting restrictions   Complete by: As directed    Weight restriction of 10lbs for 6 weeks.     Allergies as of 11/10/2020      Reactions   Bactrim [sulfamethoxazole-trimethoprim] Hives      Medication List    STOP taking these medications   acetaminophen 500 MG tablet Commonly known as: TYLENOL   Alka-Seltzer Pls Allergy & Cgh 5-6.25-10-325 MG Caps Generic drug: Phenyleph-Doxylamine-DM-APAP   diclofenac 1.3 % Ptch Commonly known as: Flector   etodolac 400 MG tablet Commonly known as: LODINE     TAKE these medications   ibuprofen 600 MG tablet Commonly known as: ADVIL Take 1 tablet (600 mg total) by mouth every 6 (six) hours as needed for mild pain or cramping (pain). What changed:   medication strength  when to take this  reasons to take this   oxyCODONE-acetaminophen 5-325 MG tablet Commonly  known as: PERCOCET/ROXICET Take 1-2 tablets by mouth every 4 (four) hours as needed for moderate pain.            Discharge Care Instructions  (From admission, onward)         Start     Ordered   11/10/20 0000  Discharge wound care:       Comments: You may apply a light dressing for minor discharge from the incision or to keep waistbands of clothing from rubbing.  You may shower, use soap on your incision.  Avoid baths or soaking the incision in the first 6 weeks following your surgery.Marland Kitchen   11/10/20 [id number]          Follow-up Information    Malachy Mood, MD.   Specialty: Obstetrics and Gynecology Contact information: [address] [place] [address] [phone]               Signed: Malachy Mood 11/19/2020, 10:12 PM

## 2020-11-21 ENCOUNTER — Encounter: Payer: Self-pay | Admitting: Obstetrics and Gynecology

## 2020-11-21 ENCOUNTER — Ambulatory Visit (INDEPENDENT_AMBULATORY_CARE_PROVIDER_SITE_OTHER): Payer: BC Managed Care – PPO | Admitting: Obstetrics and Gynecology

## 2020-11-21 ENCOUNTER — Other Ambulatory Visit: Payer: Self-pay

## 2020-11-21 VITALS — BP 100/62 | Wt 173.0 lb

## 2020-11-21 DIAGNOSIS — Z4889 Encounter for other specified surgical aftercare: Secondary | ICD-10-CM

## 2020-11-21 MED ORDER — IBUPROFEN 600 MG PO TABS
600.0000 mg | ORAL_TABLET | Freq: Four times a day (QID) | ORAL | 0 refills | Status: AC | PRN
Start: 2020-11-21 — End: ?

## 2020-11-21 NOTE — Progress Notes (Signed)
      Postoperative Follow-up Patient presents post op from laparoscopic supracervical hysterectomy, cystoscopy 2weeks ago for fibroids.  Subjective: Patient reports some improvement in her preop symptoms. Eating a regular diet without difficulty. Pain is controlled with current analgesics. Medications being used: prescription NSAID's including ibuprofen (Motrin).  Activity: normal activities of daily living.  Did note some increased swelling right lower extremity with postoperative dopplers negative for DVT, has since resolved.  Objective: Blood pressure 100/62, weight 173 lb (78.5 kg).  General: NAD Pulmonary: no increased work of breathing Abdomen: soft, non-tender, non-distended, incision(s) D/C/I Extremities: no edema Neurologic: normal gait  Office Visit on 11/15/2020  Component Date Value Ref Range Status  . POC Methamphetamine UR 11/15/2020 None Detected  None Detected Final  . POC Opiate Ur 11/15/2020 None Detected  None Detected Final  . POC Barbiturate UR 11/15/2020 None Detected  None Detected Final  . POC Amphetamine UR 11/15/2020 None Detected  None Detected Final  . POC Oxycodone UR 11/15/2020 Positive* None Detected Final  . POC Cocaine UR 11/15/2020 None Detected  None Detected Final  . POC Ecstasy UR 11/15/2020 None Detected  None Detected Final  . POC TRICYCLICS UR 16/06/9603 None Detected  None Detected Final  . POC PHENCYCLIDINE UR 11/15/2020 None Detected  None Detected Final  . POC Marijuana UR 11/15/2020 None Detected  None Detected Final  . POC Methadone UR 11/15/2020 None Detected  None Detected Final  . POC BENZODIAZEPINES UR 11/15/2020 None Detected  None Detected Final    Assessment: 50 y.o. s/p laparoscopic supracervical hysterectomy, cystoscopy stable  Plan: Patient has done well after surgery with no apparent complications.  I have discussed the post-operative course to date, and the expected progress moving forward.  The patient understands what  complications to be concerned about.  I will see the patient in routine follow up, or sooner if needed.    Activity plan: No heavy lifting.   Malachy Mood, MD, [facility], [facility] 11/21/2020, 10:15 AM

## 2020-11-22 ENCOUNTER — Telehealth: Payer: Self-pay

## 2020-11-22 NOTE — Telephone Encounter (Signed)
Pt can take OTC  

## 2020-11-22 NOTE — Telephone Encounter (Signed)
Spoke with pt she taking OTC omeprazole

## 2020-11-30 ENCOUNTER — Telehealth: Payer: Self-pay

## 2020-11-30 ENCOUNTER — Other Ambulatory Visit: Payer: Self-pay

## 2020-11-30 MED ORDER — OXYCODONE-ACETAMINOPHEN 5-325 MG PO TABS: 1.0000 | ORAL_TABLET | Freq: Three times a day (TID) | ORAL | 0 refills | Status: AC | PRN

## 2020-11-30 NOTE — Telephone Encounter (Signed)
Pt calling; had hyst 2/17; Tues she pulled something that was more than 10 lbs; had pain; took IBU, OXY, rested, and applied heating pad; is much better now.  Should she come in a be examined anyway or b/c of no pain she's okay. [phone]

## 2020-11-30 NOTE — Telephone Encounter (Signed)
Ok if improved

## 2020-11-30 NOTE — Telephone Encounter (Signed)
Pt aware.

## 2020-12-01 ENCOUNTER — Ambulatory Visit: Payer: BC Managed Care – PPO | Admitting: Physician Assistant

## 2020-12-03 ENCOUNTER — Other Ambulatory Visit: Payer: Self-pay | Admitting: Internal Medicine

## 2020-12-03 DIAGNOSIS — F411 Generalized anxiety disorder: Secondary | ICD-10-CM

## 2020-12-13 ENCOUNTER — Telehealth: Payer: Self-pay

## 2020-12-13 NOTE — Telephone Encounter (Signed)
Pt calling; states her leav is thru 3/31 but her last appt c MD isn't until the 8th; can she have a letter covering her to the 8th?  [phone] Pt aware AMS not in office today and is back in office tomorrow.

## 2020-12-14 NOTE — Telephone Encounter (Signed)
Per your return note:Return in about 5 weeks (around 12/26/2020) for 6 week postop. Do we need to try to bring her in next week?

## 2020-12-14 NOTE — Telephone Encounter (Signed)
Patient is scheduled for 12/20/20

## 2020-12-14 NOTE — Telephone Encounter (Signed)
If we can

## 2020-12-14 NOTE — Telephone Encounter (Signed)
Ideally her postop is sometime next week, she is at the 6 week postop mark on 3/31

## 2020-12-20 ENCOUNTER — Other Ambulatory Visit: Payer: Self-pay

## 2020-12-20 ENCOUNTER — Encounter: Payer: Self-pay | Admitting: Obstetrics and Gynecology

## 2020-12-20 ENCOUNTER — Ambulatory Visit (INDEPENDENT_AMBULATORY_CARE_PROVIDER_SITE_OTHER): Payer: BC Managed Care – PPO | Admitting: Obstetrics and Gynecology

## 2020-12-20 VITALS — BP 122/70 | Ht <= 58 in | Wt 182.2 lb

## 2020-12-20 DIAGNOSIS — M5431 Sciatica, right side: Secondary | ICD-10-CM

## 2020-12-20 DIAGNOSIS — Z4889 Encounter for other specified surgical aftercare: Secondary | ICD-10-CM

## 2020-12-20 MED ORDER — CYCLOBENZAPRINE HCL 10 MG PO TABS: 10.0000 mg | ORAL_TABLET | Freq: Three times a day (TID) | ORAL | 0 refills | Status: AC | PRN

## 2020-12-20 NOTE — Progress Notes (Signed)
      Postoperative Follow-up Patient presents post op from supracervical hysterectomy 6weeks ago for AUB-L.  Subjective: Patient reports marked improvement in her preop symptoms. Eating a regular diet without difficulty. Pain is controlled without any medications.  Activity: normal activities of daily living.  Does report right sided tailbone pain radiating into right leg, positional.  Objective: Blood pressure 122/70, height 4\' 9"  (1.448 m), weight 182 lb 3.2 oz (82.6 kg), last menstrual period 09/27/2020.  General: NAD Pulmonary: no increased work of breathing Abdomen: soft, non-tender, non-distended, incision(s) D/C/I GU: normal external female genitalia, normal cervix Extremities: no edema Neurologic: normal gait    Office Visit on 11/15/2020  Component Date Value Ref Range Status  . POC Methamphetamine UR 11/15/2020 None Detected  None Detected Final  . POC Opiate Ur 11/15/2020 None Detected  None Detected Final  . POC Barbiturate UR 11/15/2020 None Detected  None Detected Final  . POC Amphetamine UR 11/15/2020 None Detected  None Detected Final  . POC Oxycodone UR 11/15/2020 Positive* None Detected Final  . POC Cocaine UR 11/15/2020 None Detected  None Detected Final  . POC Ecstasy UR 11/15/2020 None Detected  None Detected Final  . POC TRICYCLICS UR 34/19/6222 None Detected  None Detected Final  . POC PHENCYCLIDINE UR 11/15/2020 None Detected  None Detected Final  . POC Marijuana UR 11/15/2020 None Detected  None Detected Final  . POC Methadone UR 11/15/2020 None Detected  None Detected Final  . POC BENZODIAZEPINES UR 11/15/2020 None Detected  None Detected Final    Assessment: 50 y.o. s/p supracervical hysterectomy stable  Plan: Patient has done well after surgery with no apparent complications.  I have discussed the post-operative course to date, and the expected progress moving forward.  The patient understands what complications to be concerned about.  I will see  the patient in routine follow up, or sooner if needed.    Activity plan: No restriction.  Sciatica - trial of flexeril.  PT referral.  Work note for out of work and additional week.  If no improvement with flexeril discussed prednisone taper.     Malachy Mood, MD, [facility], [facility] 12/20/2020, 4:56 PM

## 2020-12-26 ENCOUNTER — Telehealth: Payer: Self-pay

## 2020-12-28 ENCOUNTER — Other Ambulatory Visit: Payer: Self-pay

## 2020-12-28 ENCOUNTER — Other Ambulatory Visit: Payer: Self-pay | Admitting: Obstetrics and Gynecology

## 2020-12-28 ENCOUNTER — Ambulatory Visit (INDEPENDENT_AMBULATORY_CARE_PROVIDER_SITE_OTHER): Payer: BC Managed Care – PPO | Admitting: Hospice and Palliative Medicine

## 2020-12-28 ENCOUNTER — Encounter: Payer: Self-pay | Admitting: Hospice and Palliative Medicine

## 2020-12-28 VITALS — BP 140/71 | HR 88 | Temp 97.8°F | Resp 16 | Ht <= 58 in | Wt 181.0 lb

## 2020-12-28 DIAGNOSIS — E871 Hypo-osmolality and hyponatremia: Secondary | ICD-10-CM

## 2020-12-28 DIAGNOSIS — M545 Low back pain, unspecified: Secondary | ICD-10-CM | POA: Diagnosis not present

## 2020-12-28 DIAGNOSIS — D649 Anemia, unspecified: Secondary | ICD-10-CM

## 2020-12-28 LAB — POCT URINALYSIS DIPSTICK
Bilirubin, UA: NEGATIVE
Blood, UA: NEGATIVE
Glucose, UA: NEGATIVE
Ketones, UA: NEGATIVE
Leukocytes, UA: NEGATIVE
Nitrite, UA: NEGATIVE
Protein, UA: NEGATIVE
Spec Grav, UA: 1.015 (ref 1.010–1.025)
Urobilinogen, UA: 0.2 E.U./dL
pH, UA: 5 (ref 5.0–8.0)

## 2020-12-28 MED ORDER — TIZANIDINE HCL 2 MG PO CAPS: 2.0000 mg | ORAL_CAPSULE | Freq: Three times a day (TID) | ORAL | 1 refills | Status: DC

## 2020-12-28 MED ORDER — PREDNISONE 10 MG (21) PO TBPK: ORAL_TABLET | ORAL | 0 refills | Status: DC

## 2020-12-28 MED ORDER — MELOXICAM 7.5 MG PO TABS: 7.5000 mg | ORAL_TABLET | Freq: Every day | ORAL | 0 refills | Status: DC

## 2020-12-28 NOTE — Progress Notes (Signed)
[facility] [place], [place] [address]  Internal MEDICINE  Office Visit Note  Patient Name: Joanne Maddox  [id number]  [id number]  Date of Service: 12/30/2020  Chief Complaint  Patient presents with  . Follow-up  . Back Pain    Lower going on for few days   . 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 ALPRAZolam (XANAX) 0.25 MG tablet Take 1 tablet (0.25 mg total) by mouth at bedtime as needed for anxiety.  . cyclobenzaprine (FLEXERIL) 10 MG tablet Take 1 tablet (10 mg total) by mouth 3 (three) times daily as needed for muscle spasms.  Marland Kitchen FLUoxetine (PROZAC) 20 MG tablet Take 1 tablet (20 mg total) by mouth daily.  . fluticasone (FLONASE) 50 MCG/ACT nasal spray Place 2 sprays into both nostrils daily as needed for allergies.  Marland Kitchen ibuprofen (ADVIL) 600 MG tablet Take 1 tablet (600 mg total) by mouth every 6 (six) hours as needed for mild pain or cramping (pain).  Marland Kitchen lisinopril-hydrochlorothiazide (ZESTORETIC) 20-12.5 MG tablet Take 2 tablets by mouth daily.  Marland Kitchen lovastatin (MEVACOR) 40 MG tablet Take 1 tablet (40 mg total) by mouth 4 (four) times a week. At night  . meloxicam (MOBIC) 7.5 MG tablet Take 1 tablet (7.5 mg total) by mouth daily.   Marland Kitchen omeprazole (PRILOSEC) 40 MG capsule Take 1 capsule (40 mg total) by mouth daily as needed (acid reflux). (Patient taking differently: Take 40 mg by mouth daily as needed (acid reflux). Pt take OTC)  . SUMAtriptan (IMITREX) 50 MG tablet Take 1 tablet (50 mg total) by mouth every 2 (two) hours as needed for migraine. May repeat in 2 hours if headache persists or recurs.  . tizanidine (ZANAFLEX) 2 MG capsule Take 1 capsule (2 mg total) by mouth 3 (three) times daily.  . [DISCONTINUED] predniSONE (STERAPRED UNI-PAK 21 TAB) 10 MG (21) TBPK tablet Take 6 tablets (60 mg total) by mouth daily for 1 day, THEN 5 tablets (50 mg total) daily for 1 day, THEN 4 tablets (40 mg total) daily for 1 day, THEN 3 tablets (30 mg total) daily for 1 day, THEN 2 tablets (20 mg total) daily for 1 day, THEN 1 tablet (10 mg total) daily for 1 day.   No facility-administered encounter medications on file as of 12/28/2020.    Surgical History: Past Surgical History:  Procedure Laterality Date  . CESAREAN SECTION  1989  . COLONOSCOPY  2010   Dr Vira Agar  . CYSTOSCOPY N/A 11/09/2020   Procedure: CYSTOSCOPY;  Surgeon: Malachy Mood, MD;  Location: [facility] ORS;  Service: Gynecology;  Laterality: N/A;  . KNEE SURGERY Right 1999  . TOTAL LAPAROSCOPIC HYSTERECTOMY WITH SALPINGECTOMY Bilateral 11/09/2020   Procedure: TOTAL LAPAROSCOPIC HYSTERECTOMY WITH BILATERAL SALPINGECTOMY;  Surgeon:  Malachy Mood, MD;  Location: [facility] ORS;  Service: Gynecology;  Laterality: Bilateral;    Medical History: Past Medical History:  Diagnosis Date  . Anxiety   . Colon polyp 2010  . GERD (gastroesophageal reflux disease)   . Hypercholesteremia   . Hyperlipidemia   . Hypertension   . Migraine     Family History: Family History  Problem Relation Age of Onset  . Diabetes Mother   . Hypertension Mother   . Heart disease Mother   . Hypertension Father   . Heart disease Father   . Breast cancer Neg Hx     Social History    Socioeconomic History  . Marital status: Married    Spouse name: Not on file  . Number of children: Not on file  . Years of education: Not on file  . Highest education level: Not on file  Occupational History  . Not on file  Tobacco Use  . Smoking status: Never Smoker  . Smokeless tobacco: Never Used  Vaping Use  . Vaping Use: Never used  Substance and Sexual Activity  . Alcohol use: Yes    Comment: occasionally  . Drug use: No  . Sexual activity: Yes    Birth control/protection: Surgical    Comment: Hysterectomy  Other Topics Concern  . Not on file  Social History Narrative  . Not on file   Social Determinants of Health   Financial Resource Strain: Not on file  Food Insecurity: Not on file  Transportation Needs: Not on file  Physical Activity: Not on file  Stress: Not on file  Social Connections: Not on file  Intimate Partner Violence: Not on file      Review of Systems  Constitutional: Negative for chills, diaphoresis and fatigue.  HENT: Negative for ear pain, postnasal drip and sinus pressure.   Eyes: Negative for photophobia, discharge, redness, itching and visual disturbance.  Respiratory: Negative for cough, shortness of breath and wheezing.   Cardiovascular: Negative for chest pain, palpitations and leg swelling.  Gastrointestinal: Negative for abdominal pain, constipation, diarrhea, nausea and vomiting.  Genitourinary: Negative for dysuria and flank pain.  Musculoskeletal: Positive for back pain. Negative for arthralgias, gait problem and neck pain.       Left lower back pain  Skin: Negative for color change.  Allergic/Immunologic: Negative for environmental allergies and food allergies.  Neurological: Negative for dizziness and headaches.  Hematological: Does not bruise/bleed easily.  Psychiatric/Behavioral: Negative for agitation, behavioral problems (depression) and hallucinations.    Vital Signs: BP 140/71   Pulse 88   Temp 97.8 F (36.6 C)    Resp 16   Ht 4\' 9"  (1.448 m)   Wt 181 lb (82.1 kg)   SpO2 99%   BMI 39.17 kg/m    Physical Exam Vitals reviewed.  Constitutional:      Appearance: Normal appearance. She is obese.  Cardiovascular:     Rate and Rhythm: Normal rate and regular rhythm.     Pulses: Normal pulses.     Heart sounds: Normal heart sounds.  Pulmonary:     Effort: Pulmonary effort is normal.     Breath sounds: Normal breath sounds.  Musculoskeletal:     Cervical back: Normal range of motion.     Lumbar back: Tenderness present. No swelling. Normal range of motion.     Comments: Tenderness with palpation to lower left back  Skin:    General: Skin is warm.  Neurological:     General: No focal deficit  present.     Mental Status: She is alert and oriented to person, place, and time. Mental status is at baseline.  Psychiatric:        Mood and Affect: Mood normal.        Behavior: Behavior normal.        Thought Content: Thought content normal.        Judgment: Judgment normal.    Assessment/Plan: 1. Low back pain without sciatica, unspecified back pain laterality, unspecified chronicity Urine normal--no evidence of infection Update lumbar imaging due to known abnormalities Start Mobic as well as Zanaflex as needed for pain Close follow-up, based on imaging may need ortho referral - POCT Urinalysis Dipstick - meloxicam (MOBIC) 7.5 MG tablet; Take 1 tablet (7.5 mg total) by mouth daily.  Dispense: 30 tablet; Refill: 0 - tizanidine (ZANAFLEX) 2 MG capsule; Take 1 capsule (2 mg total) by mouth 3 (three) times daily.  Dispense: 90 capsule; Refill: 1 - DG Lumbar Spine Complete; Future  2. Hyponatremia Low NA on labs during hospitalization, repeat levels and treat accordingly - Comprehensive Metabolic Panel (CMET)  3. Anemia, unspecified type Updated labs and treat accordingly, anemic during hospitalization for hysterectomy procedure - CBC w/Diff/Platelet - Vitamin B12 - Folate - Iron and TIBC -  Ferritin  General Counseling: Kniyah verbalizes understanding of the findings of todays visit and agrees with plan of treatment. I have discussed any further diagnostic evaluation that may be needed or ordered today. We also reviewed her medications today. she has been encouraged to call the office with any questions or concerns that should arise related to todays visit.    Orders Placed This Encounter  Procedures  . DG Lumbar Spine Complete  . CBC w/Diff/Platelet  . Comprehensive Metabolic Panel (CMET)  . Vitamin B12  . Folate  . Iron and TIBC  . Ferritin  . POCT Urinalysis Dipstick    Meds ordered this encounter  Medications  . meloxicam (MOBIC) 7.5 MG tablet    Sig: Take 1 tablet (7.5 mg total) by mouth daily.    Dispense:  30 tablet    Refill:  0  . tizanidine (ZANAFLEX) 2 MG capsule    Sig: Take 1 capsule (2 mg total) by mouth 3 (three) times daily.    Dispense:  90 capsule    Refill:  1    Time spent: 30 Minutes Time spent includes review of chart, medications, test results and follow-up plan with the patient.  This patient was seen by Theodoro Grist AGNP-C in Collaboration with Dr Lavera Guise as a part of collaborative care agreement     Tanna Furry. Candies Palm AGNP-C Internal medicine

## 2020-12-28 NOTE — Telephone Encounter (Signed)
Rx called in 

## 2020-12-29 ENCOUNTER — Ambulatory Visit
Admission: RE | Admit: 2020-12-29 | Discharge: 2020-12-29 | Disposition: A | Discharge status: Home / Self Care | Payer: BC Managed Care – PPO | Attending: Hospice and Palliative Medicine | Admitting: Hospice and Palliative Medicine

## 2020-12-29 ENCOUNTER — Ambulatory Visit
Admission: RE | Admit: 2020-12-29 | Discharge: 2020-12-29 | Disposition: A | Discharge status: Home / Self Care | Payer: BC Managed Care – PPO | Source: Ambulatory Visit | Attending: Hospice and Palliative Medicine | Admitting: Hospice and Palliative Medicine

## 2020-12-29 ENCOUNTER — Ambulatory Visit: Payer: BC Managed Care – PPO | Admitting: Obstetrics and Gynecology

## 2020-12-29 DIAGNOSIS — M545 Low back pain, unspecified: Secondary | ICD-10-CM | POA: Diagnosis not present

## 2020-12-30 ENCOUNTER — Encounter: Payer: Self-pay | Admitting: Hospice and Palliative Medicine

## 2021-01-01 NOTE — Progress Notes (Signed)
X-ray reviewed, will discuss at upcoming visit.

## 2021-01-02 ENCOUNTER — Telehealth: Payer: Self-pay

## 2021-01-02 ENCOUNTER — Other Ambulatory Visit: Payer: Self-pay | Admitting: Hospice and Palliative Medicine

## 2021-01-02 NOTE — Telephone Encounter (Signed)
Pt advised lumbar spine xray showed bone Spurr if she still having pain we can send her to orthopedic as per pt she had ortho she will call them and if they need referral she will call back and advised her that we discuss in detailed when she coming for follow up

## 2021-01-05 DIAGNOSIS — D649 Anemia, unspecified: Secondary | ICD-10-CM | POA: Diagnosis not present

## 2021-01-05 DIAGNOSIS — E538 Deficiency of other specified B group vitamins: Secondary | ICD-10-CM | POA: Diagnosis not present

## 2021-01-05 DIAGNOSIS — D529 Folate deficiency anemia, unspecified: Secondary | ICD-10-CM | POA: Diagnosis not present

## 2021-01-05 DIAGNOSIS — E871 Hypo-osmolality and hyponatremia: Secondary | ICD-10-CM | POA: Diagnosis not present

## 2021-01-06 LAB — CBC WITH DIFFERENTIAL/PLATELET
Basophils Absolute: 0 10*3/uL (ref 0.0–0.2)
Basos: 0 %
EOS (ABSOLUTE): 0.1 10*3/uL (ref 0.0–0.4)
Eos: 1 %
Hematocrit: 31.9 % — ABNORMAL LOW (ref 34.0–46.6)
Hemoglobin: 10.3 g/dL — ABNORMAL LOW (ref 11.1–15.9)
Immature Grans (Abs): 0 10*3/uL (ref 0.0–0.1)
Immature Granulocytes: 0 %
Lymphocytes Absolute: 2 10*3/uL (ref 0.7–3.1)
Lymphs: 25 %
MCH: 26.6 pg (ref 26.6–33.0)
MCHC: 32.3 g/dL (ref 31.5–35.7)
MCV: 82 fL (ref 79–97)
Monocytes Absolute: 0.5 10*3/uL (ref 0.1–0.9)
Monocytes: 6 %
Neutrophils Absolute: 5.4 10*3/uL (ref 1.4–7.0)
Neutrophils: 68 %
Platelets: 400 10*3/uL (ref 150–450)
RBC: 3.87 x10E6/uL (ref 3.77–5.28)
RDW: 14.5 % (ref 11.7–15.4)
WBC: 8 10*3/uL (ref 3.4–10.8)

## 2021-01-06 LAB — FERRITIN: Ferritin: 18 ng/mL (ref 15–150)

## 2021-01-06 LAB — COMPREHENSIVE METABOLIC PANEL
ALT: 11 IU/L (ref 0–32)
AST: 15 IU/L (ref 0–40)
Albumin/Globulin Ratio: 1.6 (ref 1.2–2.2)
Albumin: 4.1 g/dL (ref 3.8–4.8)
Alkaline Phosphatase: 113 IU/L (ref 44–121)
BUN/Creatinine Ratio: 12 (ref 9–23)
BUN: 12 mg/dL (ref 6–24)
Bilirubin Total: <0.2 mg/dL (ref 0.0–1.2)
CO2: 20 mmol/L (ref 20–29)
Calcium: 9.1 mg/dL (ref 8.7–10.2)
Chloride: 103 mmol/L (ref 96–106)
Creatinine, Ser: 0.98 mg/dL (ref 0.57–1.00)
Globulin, Total: 2.6 g/dL (ref 1.5–4.5)
Glucose: 92 mg/dL (ref 65–99)
Potassium: 4 mmol/L (ref 3.5–5.2)
Sodium: 140 mmol/L (ref 134–144)
Total Protein: 6.7 g/dL (ref 6.0–8.5)
eGFR: 70 mL/min/{1.73_m2} (ref >59)

## 2021-01-06 LAB — IRON AND TIBC
Iron Saturation: 8 % — CL (ref 15–55)
Iron: 35 ug/dL (ref 27–159)
Total Iron Binding Capacity: 443 ug/dL (ref 250–450)
UIBC: 408 ug/dL (ref 131–425)

## 2021-01-06 LAB — FOLATE: Folate: 8.7 ng/mL (ref >3.0)

## 2021-01-06 LAB — VITAMIN B12: Vitamin B-12: 642 pg/mL (ref 232–1245)

## 2021-01-09 NOTE — Progress Notes (Signed)
Please review labs and advise.

## 2021-01-09 NOTE — Progress Notes (Signed)
IDA treatment plus GI

## 2021-01-10 ENCOUNTER — Other Ambulatory Visit: Payer: Self-pay

## 2021-01-10 ENCOUNTER — Ambulatory Visit: Payer: BC Managed Care – PPO | Admitting: Hospice and Palliative Medicine

## 2021-01-10 ENCOUNTER — Encounter: Payer: Self-pay | Admitting: Hospice and Palliative Medicine

## 2021-01-10 VITALS — BP 137/78 | HR 82 | Temp 98.2°F | Resp 16 | Ht <= 58 in | Wt 181.4 lb

## 2021-01-10 DIAGNOSIS — Z1211 Encounter for screening for malignant neoplasm of colon: Secondary | ICD-10-CM

## 2021-01-10 DIAGNOSIS — M545 Low back pain, unspecified: Secondary | ICD-10-CM

## 2021-01-10 DIAGNOSIS — D509 Iron deficiency anemia, unspecified: Secondary | ICD-10-CM

## 2021-01-10 MED ORDER — FERROUS FUMARATE 324 (106 FE) MG PO TABS: 1.0000 | ORAL_TABLET | Freq: Every day | ORAL | 1 refills | Status: DC

## 2021-01-10 NOTE — Progress Notes (Signed)
[facility] [place], [place] [address]  Internal MEDICINE  Office Visit Note  Patient Name: Joanne Maddox  [id number]  [id number]  Date of Service: 01/17/2021  Chief Complaint  Patient presents with  . Anxiety  . Hypertension  . Gastroesophageal Reflux  . Hyperlipidemia  . Follow-up  . Quality Metric Gaps    colonoscopy    HPI Patient is here for routine follow-up Continues to have low back pain--mid low back pain and will occasionally have numbness in her lower extremitites Has an appointment with spine speciliast in [facility] at the end of this month Reviewed lumbar imaging IMPRESSION: Mild spondylosis with endplate spurring, most prominent at L4-L5.  Reviewed her recent labs--remains anemia with decreased iron saturation Hgb has slightly improved since last check Denies evidence of bright red blood or dark tarry stools  Due for screening colonoscopy  Current Medication: Outpatient Encounter Medications as of 01/10/2021  Medication Sig  . ALPRAZolam (XANAX) 0.25 MG tablet Take 1 tablet (0.25 mg total) by mouth at bedtime as needed for anxiety.  . cyclobenzaprine (FLEXERIL) 10 MG tablet Take 1 tablet (10 mg total) by mouth 3 (three) times daily as needed for muscle spasms.  . Ferrous Fumarate (HEMOCYTE) 324 (106 Fe) MG TABS tablet Take 1 tablet (106 mg of iron total) by mouth daily.  Marland Kitchen FLUoxetine (PROZAC) 20 MG tablet Take 1 tablet (20 mg total) by mouth daily.  . fluticasone (FLONASE) 50 MCG/ACT nasal spray Place 2 sprays into both nostrils daily as needed for allergies.  Marland Kitchen ibuprofen (ADVIL) 600 MG tablet Take 1 tablet (600 mg total) by mouth every 6 (six) hours as needed for mild pain or cramping (pain).  Marland Kitchen lisinopril-hydrochlorothiazide (ZESTORETIC) 20-12.5 MG tablet Take 2 tablets by mouth daily.  Marland Kitchen lovastatin (MEVACOR) 40 MG tablet Take 1 tablet (40 mg total) by mouth 4 (four) times a week. At night  . meloxicam (MOBIC) 7.5 MG tablet Take  1 tablet (7.5 mg total) by mouth daily.  Marland Kitchen omeprazole (PRILOSEC) 40 MG capsule Take 1 capsule (40 mg total) by mouth daily as needed (acid reflux). (Patient taking differently: Take 40 mg by mouth daily as needed (acid reflux). Pt take OTC)  . SUMAtriptan (IMITREX) 50 MG tablet Take 1 tablet (50 mg total) by mouth every 2 (two) hours as needed for migraine. May repeat in 2 hours if headache persists or recurs.  . tizanidine (ZANAFLEX) 2 MG capsule Take 1 capsule (2 mg total) by mouth 3 (three) times daily.   No facility-administered encounter medications on file as of 01/10/2021.    Surgical History: Past Surgical History:  Procedure Laterality Date  . CESAREAN SECTION  1989  . COLONOSCOPY  2010   Dr Vira Agar  . CYSTOSCOPY N/A 11/09/2020   Procedure: CYSTOSCOPY;  Surgeon: Malachy Mood, MD;  Location: [facility] ORS;  Service: Gynecology;  Laterality: N/A;  . KNEE SURGERY Right 1999  . TOTAL LAPAROSCOPIC HYSTERECTOMY WITH SALPINGECTOMY Bilateral 11/09/2020   Procedure: TOTAL LAPAROSCOPIC HYSTERECTOMY WITH BILATERAL SALPINGECTOMY;  Surgeon: Malachy Mood, MD;  Location: [facility] ORS;  Service: Gynecology;  Laterality: Bilateral;    Medical History: Past Medical History:  Diagnosis Date  . Anxiety   . Colon polyp 2010  . GERD (gastroesophageal reflux disease)   . Hypercholesteremia   . Hyperlipidemia   . Hypertension   . Migraine     Family History: Family History  Problem Relation Age of Onset  . Diabetes Mother   . Hypertension Mother   .  Heart disease Mother   . Hypertension Father   . Heart disease Father   . Breast cancer Neg Hx     Social History   Socioeconomic History  . Marital status: Married    Spouse name: Not on file  . Number of children: Not on file  . Years of education: Not on file  . Highest education level: Not on file  Occupational History  . Not on file  Tobacco Use  . Smoking status: Never Smoker  . Smokeless tobacco: Never Used  Vaping Use  .  Vaping Use: Never used  Substance and Sexual Activity  . Alcohol use: Yes    Comment: occasionally  . Drug use: No  . Sexual activity: Yes    Birth control/protection: Surgical    Comment: Hysterectomy  Other Topics Concern  . Not on file  Social History Narrative  . Not on file   Social Determinants of Health   Financial Resource Strain: Not on file  Food Insecurity: Not on file  Transportation Needs: Not on file  Physical Activity: Not on file  Stress: Not on file  Social Connections: Not on file  Intimate Partner Violence: Not on file      Review of Systems  Constitutional: Negative for chills, diaphoresis and fatigue.  HENT: Negative for ear pain, postnasal drip and sinus pressure.   Eyes: Negative for photophobia, discharge, redness, itching and visual disturbance.  Respiratory: Negative for cough, shortness of breath and wheezing.   Cardiovascular: Negative for chest pain, palpitations and leg swelling.  Gastrointestinal: Negative for abdominal pain, constipation, diarrhea, nausea and vomiting.  Genitourinary: Negative for dysuria and flank pain.  Musculoskeletal: Positive for back pain. Negative for arthralgias, gait problem and neck pain.  Skin: Negative for color change.  Allergic/Immunologic: Negative for environmental allergies and food allergies.  Neurological: Negative for dizziness and headaches.  Hematological: Does not bruise/bleed easily.  Psychiatric/Behavioral: Negative for agitation, behavioral problems (depression) and hallucinations.    Vital Signs: BP 137/78   Pulse 82   Temp 98.2 F (36.8 C)   Resp 16   Ht 4\' 9"  (1.448 m)   Wt 181 lb 6.4 oz (82.3 kg)   SpO2 99%   BMI 39.25 kg/m    Physical Exam Vitals reviewed.  Constitutional:      Appearance: Normal appearance. She is normal weight.  Cardiovascular:     Rate and Rhythm: Normal rate and regular rhythm.     Pulses: Normal pulses.     Heart sounds: Normal heart sounds.  Pulmonary:      Effort: Pulmonary effort is normal.     Breath sounds: Normal breath sounds.  Abdominal:     General: Abdomen is flat.     Palpations: Abdomen is soft.  Musculoskeletal:        General: Normal range of motion.     Cervical back: Normal range of motion.  Skin:    General: Skin is warm.  Neurological:     General: No focal deficit present.     Mental Status: She is alert and oriented to person, place, and time. Mental status is at baseline.  Psychiatric:        Mood and Affect: Mood normal.        Behavior: Behavior normal.        Thought Content: Thought content normal.        Judgment: Judgment normal.    Assessment/Plan: 1. Iron deficiency anemia, unspecified iron deficiency anemia type Start iron supplementation  May take stool softener or take supplement every other day if constipation becomes an issue - Ferrous Fumarate (HEMOCYTE) 324 (106 Fe) MG TABS tablet; Take 1 tablet (106 mg of iron total) by mouth daily.  Dispense: 90 tablet; Refill: 1  2. Screening for colon cancer - Ambulatory referral to Gastroenterology  3. Low back pain without sciatica, unspecified back pain laterality, unspecified chronicity Continue to monitor--has an appointment with specialist later this month  General Counseling: carlen rebuck understanding of the findings of todays visit and agrees with plan of treatment. I have discussed any further diagnostic evaluation that may be needed or ordered today. We also reviewed her medications today. she has been encouraged to call the office with any questions or concerns that should arise related to todays visit.    Orders Placed This Encounter  Procedures  . Ambulatory referral to Gastroenterology    Meds ordered this encounter  Medications  . Ferrous Fumarate (HEMOCYTE) 324 (106 Fe) MG TABS tablet    Sig: Take 1 tablet (106 mg of iron total) by mouth daily.    Dispense:  90 tablet    Refill:  1    Time spent: 30 Minutes Time spent  includes review of chart, medications, test results and follow-up plan with the patient.  This patient was seen by Theodoro Grist AGNP-C in Collaboration with Dr Lavera Guise as a part of collaborative care agreement     Tanna Furry. Sparsh Callens AGNP-C Internal medicine

## 2021-01-17 ENCOUNTER — Encounter: Payer: Self-pay | Admitting: Hospice and Palliative Medicine

## 2021-01-19 ENCOUNTER — Other Ambulatory Visit: Payer: Self-pay | Admitting: Internal Medicine

## 2021-01-19 DIAGNOSIS — Z6838 Body mass index (BMI) 38.0-38.9, adult: Secondary | ICD-10-CM | POA: Diagnosis not present

## 2021-01-19 DIAGNOSIS — M5416 Radiculopathy, lumbar region: Secondary | ICD-10-CM | POA: Diagnosis not present

## 2021-01-19 DIAGNOSIS — Z1231 Encounter for screening mammogram for malignant neoplasm of breast: Secondary | ICD-10-CM

## 2021-01-24 ENCOUNTER — Other Ambulatory Visit: Payer: Self-pay | Admitting: Hospice and Palliative Medicine

## 2021-01-24 DIAGNOSIS — M545 Low back pain, unspecified: Secondary | ICD-10-CM

## 2021-01-25 ENCOUNTER — Telehealth (INDEPENDENT_AMBULATORY_CARE_PROVIDER_SITE_OTHER): Payer: Self-pay | Admitting: Gastroenterology

## 2021-01-25 ENCOUNTER — Other Ambulatory Visit: Payer: Self-pay

## 2021-01-25 DIAGNOSIS — Z1211 Encounter for screening for malignant neoplasm of colon: Secondary | ICD-10-CM

## 2021-01-25 MED ORDER — PEG 3350-KCL-NA BICARB-NACL 420 G PO SOLR: 4000.0000 mL | Freq: Once | ORAL | 0 refills | Status: AC

## 2021-01-25 NOTE — Progress Notes (Signed)
Gastroenterology Pre-Procedure Review  Request Date: 02/16/21 Requesting Physician: Dr. Bonna Gains  PATIENT REVIEW QUESTIONS: The patient responded to the following health history questions as indicated:    1. Are you having any GI issues? since taking iron supplements pt has noticed change in stool color.  Pt also stated that she does experieince pain that come and goes under the right side of her breast.  I informed patient I would note this but also bring it to the attention of her PCP and or gynecologist  2. Do you have a personal history of Polyps? no 3. Do you have a family history of Colon Cancer or Polyps? no 4. Diabetes Mellitus? no 5. Joint replacements in the past 12 months?no 6. Major health problems in the past 3 months?yes (hysterectomy Feb 2022) 7. Any artificial heart valves, MVP, or defibrillator?no    MEDICATIONS & ALLERGIES:    Patient reports the following regarding taking any anticoagulation/antiplatelet therapy:   Plavix, Coumadin, Eliquis, Xarelto, Lovenox, Pradaxa, Brilinta, or Effient? no Aspirin? no  Patient confirms/reports the following medications:  Current Outpatient Medications  Medication Sig Dispense Refill  . ALPRAZolam (XANAX) 0.25 MG tablet Take 1 tablet (0.25 mg total) by mouth at bedtime as needed for anxiety. 30 tablet 1  . cyclobenzaprine (FLEXERIL) 10 MG tablet Take 1 tablet (10 mg total) by mouth 3 (three) times daily as needed for muscle spasms. 30 tablet 0  . Ferrous Fumarate (HEMOCYTE) 324 (106 Fe) MG TABS tablet Take 1 tablet (106 mg of iron total) by mouth daily. 90 tablet 1  . FLUoxetine (PROZAC) 20 MG tablet Take 1 tablet (20 mg total) by mouth daily. 30 tablet 3  . fluticasone (FLONASE) 50 MCG/ACT nasal spray Place 2 sprays into both nostrils daily as needed for allergies. 9.9 mL 3  . gabapentin (NEURONTIN) 300 MG capsule Take by mouth.    Marland Kitchen ibuprofen (ADVIL) 600 MG tablet Take 1 tablet (600 mg total) by mouth every 6 (six) hours as needed  for mild pain or cramping (pain). 40 tablet 0  . lisinopril-hydrochlorothiazide (ZESTORETIC) 20-12.5 MG tablet Take 2 tablets by mouth daily. 60 tablet 3  . lovastatin (MEVACOR) 40 MG tablet Take 1 tablet (40 mg total) by mouth 4 (four) times a week. At night 30 tablet 1  . meloxicam (MOBIC) 7.5 MG tablet TAKE 1 TABLET BY MOUTH EVERY DAY 30 tablet 0  . omeprazole (PRILOSEC) 40 MG capsule Take 1 capsule (40 mg total) by mouth daily as needed (acid reflux). (Patient taking differently: Take 40 mg by mouth daily as needed (acid reflux). Pt take OTC) 90 capsule 1  . SUMAtriptan (IMITREX) 50 MG tablet Take 1 tablet (50 mg total) by mouth every 2 (two) hours as needed for migraine. May repeat in 2 hours if headache persists or recurs. 10 tablet 0  . tizanidine (ZANAFLEX) 2 MG capsule Take 1 capsule (2 mg total) by mouth 3 (three) times daily. 90 capsule 1   No current facility-administered medications for this visit.    Patient confirms/reports the following allergies:  Allergies  Allergen Reactions  . Bactrim [Sulfamethoxazole-Trimethoprim] Hives    No orders of the defined types were placed in this encounter.   AUTHORIZATION INFORMATION Primary Insurance: 1D#: Group #:  Secondary Insurance: 1D#: Group #:  SCHEDULE INFORMATION: Date: 02/16/21 Time: Location:[facility]

## 2021-01-26 ENCOUNTER — Ambulatory Visit
Admission: RE | Admit: 2021-01-26 | Discharge: 2021-01-26 | Disposition: A | Discharge status: Home / Self Care | Payer: BC Managed Care – PPO | Source: Ambulatory Visit | Attending: Internal Medicine | Admitting: Internal Medicine

## 2021-01-26 ENCOUNTER — Other Ambulatory Visit: Payer: Self-pay

## 2021-01-26 DIAGNOSIS — Z1231 Encounter for screening mammogram for malignant neoplasm of breast: Secondary | ICD-10-CM | POA: Insufficient documentation

## 2021-01-26 NOTE — Progress Notes (Signed)
Pt needs diagnostics mammogram, followed by u/s left breast

## 2021-01-26 NOTE — Progress Notes (Signed)
Pls see

## 2021-01-30 ENCOUNTER — Other Ambulatory Visit: Payer: Self-pay | Admitting: Internal Medicine

## 2021-01-30 DIAGNOSIS — N6320 Unspecified lump in the left breast, unspecified quadrant: Secondary | ICD-10-CM

## 2021-01-30 DIAGNOSIS — R928 Other abnormal and inconclusive findings on diagnostic imaging of breast: Secondary | ICD-10-CM

## 2021-01-31 ENCOUNTER — Telehealth: Payer: Self-pay

## 2021-01-31 NOTE — Telephone Encounter (Signed)
Spoke with pt that we order mammogram order for more imaging [facility] will call her and schedule it

## 2021-02-01 ENCOUNTER — Other Ambulatory Visit: Payer: Self-pay

## 2021-02-01 DIAGNOSIS — R928 Other abnormal and inconclusive findings on diagnostic imaging of breast: Secondary | ICD-10-CM

## 2021-02-01 DIAGNOSIS — N6320 Unspecified lump in the left breast, unspecified quadrant: Secondary | ICD-10-CM

## 2021-02-06 ENCOUNTER — Telehealth: Payer: Self-pay | Admitting: *Deleted

## 2021-02-06 NOTE — Telephone Encounter (Signed)
CALLED PT TO [facility] / MAMMO - STATED SHE WANTED TO GO TO [facility] FOR THESE SERVICES.

## 2021-02-09 ENCOUNTER — Telehealth: Payer: Self-pay

## 2021-02-09 NOTE — Telephone Encounter (Signed)
Faxed mammogram order to [facility] at [phone].

## 2021-02-15 ENCOUNTER — Telehealth: Payer: Self-pay

## 2021-02-15 NOTE — Telephone Encounter (Signed)
-----   Message from Lavera Guise, MD sent at 02/15/2021  2:29 PM EDT ----- Can u check with her to see when is her diagnostic mammo

## 2021-02-15 NOTE — Telephone Encounter (Signed)
Pt is working on getting her appt scheduled, she said she will have the diagnostic mammogram done within the next month

## 2021-02-16 ENCOUNTER — Encounter
Admission: RE | Disposition: A | Discharge status: Home / Self Care | Payer: Self-pay | Source: Home / Self Care | Attending: Gastroenterology

## 2021-02-16 ENCOUNTER — Ambulatory Visit: Payer: BC Managed Care – PPO | Admitting: Anesthesiology

## 2021-02-16 ENCOUNTER — Ambulatory Visit
Admission: RE | Admit: 2021-02-16 | Discharge: 2021-02-16 | Disposition: A | Discharge status: Home / Self Care | Payer: BC Managed Care – PPO | Attending: Gastroenterology | Admitting: Gastroenterology

## 2021-02-16 DIAGNOSIS — Z833 Family history of diabetes mellitus: Secondary | ICD-10-CM | POA: Diagnosis not present

## 2021-02-16 DIAGNOSIS — Z1211 Encounter for screening for malignant neoplasm of colon: Secondary | ICD-10-CM | POA: Diagnosis not present

## 2021-02-16 DIAGNOSIS — K219 Gastro-esophageal reflux disease without esophagitis: Secondary | ICD-10-CM | POA: Diagnosis not present

## 2021-02-16 DIAGNOSIS — Z882 Allergy status to sulfonamides status: Secondary | ICD-10-CM | POA: Diagnosis not present

## 2021-02-16 DIAGNOSIS — K648 Other hemorrhoids: Secondary | ICD-10-CM | POA: Insufficient documentation

## 2021-02-16 DIAGNOSIS — Z8249 Family history of ischemic heart disease and other diseases of the circulatory system: Secondary | ICD-10-CM | POA: Diagnosis not present

## 2021-02-16 DIAGNOSIS — Z79899 Other long term (current) drug therapy: Secondary | ICD-10-CM | POA: Insufficient documentation

## 2021-02-16 DIAGNOSIS — Z791 Long term (current) use of non-steroidal anti-inflammatories (NSAID): Secondary | ICD-10-CM | POA: Insufficient documentation

## 2021-02-16 DIAGNOSIS — I10 Essential (primary) hypertension: Secondary | ICD-10-CM | POA: Diagnosis not present

## 2021-02-16 HISTORY — PX: COLONOSCOPY WITH PROPOFOL: SHX5780

## 2021-02-16 SURGERY — COLONOSCOPY WITH PROPOFOL
Anesthesia: General

## 2021-02-16 MED ORDER — PROPOFOL 10 MG/ML IV BOLUS
INTRAVENOUS | Status: DC | PRN
  Administered 2021-02-16: 50 mg via INTRAVENOUS

## 2021-02-16 MED ORDER — PROPOFOL 500 MG/50ML IV EMUL
INTRAVENOUS | Status: AC
  Filled 2021-02-16: qty 50

## 2021-02-16 MED ORDER — LIDOCAINE HCL (CARDIAC) PF 100 MG/5ML IV SOSY
PREFILLED_SYRINGE | INTRAVENOUS | Status: DC | PRN
  Administered 2021-02-16: 40 mg via INTRAVENOUS

## 2021-02-16 MED ORDER — GLYCOPYRROLATE 0.2 MG/ML IJ SOLN
INTRAMUSCULAR | Status: DC | PRN
  Administered 2021-02-16: .2 mg via INTRAVENOUS

## 2021-02-16 MED ORDER — SODIUM CHLORIDE 0.9 % IV SOLN
INTRAVENOUS | Status: DC
  Administered 2021-02-16: 20 mL/h via INTRAVENOUS

## 2021-02-16 MED ORDER — PROPOFOL 500 MG/50ML IV EMUL
INTRAVENOUS | Status: DC | PRN
  Administered 2021-02-16: 140 ug/kg/min via INTRAVENOUS

## 2021-02-16 NOTE — Transfer of Care (Signed)
Immediate Anesthesia Transfer of Care Note  Patient: Joanne Maddox  Procedure(s) Performed: COLONOSCOPY WITH PROPOFOL (N/A )  Patient Location: PACU and Endoscopy Unit  Anesthesia Type:General  Level of Consciousness: awake, drowsy and patient cooperative  Airway & Oxygen Therapy: Patient Spontanous Breathing  Post-op Assessment: Report given to RN and Post -op Vital signs reviewed and stable  Post vital signs: Reviewed and stable  Last Vitals:  Vitals Value Taken Time  BP 103/49 02/16/21 1117  Temp 35.7 C 02/16/21 1117  Pulse 88 02/16/21 1117  Resp 20 02/16/21 1117  SpO2 95 % 02/16/21 1117    Last Pain:  Vitals:   02/16/21 1117  TempSrc: Temporal  PainSc: 0-No pain         Complications: No complications documented.

## 2021-02-16 NOTE — Anesthesia Postprocedure Evaluation (Signed)
Anesthesia Post Note  Patient: Joanne Maddox  Procedure(s) Performed: COLONOSCOPY WITH PROPOFOL (N/A )  Patient location during evaluation: Phase II Anesthesia Type: General Level of consciousness: awake and alert, awake and oriented Pain management: pain level controlled Vital Signs Assessment: post-procedure vital signs reviewed and stable Respiratory status: spontaneous breathing, nonlabored ventilation and respiratory function stable Cardiovascular status: blood pressure returned to baseline and stable Postop Assessment: no apparent nausea or vomiting Anesthetic complications: no   No complications documented.   Last Vitals:  Vitals:   02/16/21 1117 02/16/21 1127  BP: (!) 103/49 112/83  Pulse: 88 71  Resp: 20 (!) 22  Temp: (!) 35.7 C   SpO2: 95% 100%    Last Pain:  Vitals:   02/16/21 1127  TempSrc:   PainSc: 0-No pain                 Phill Mutter

## 2021-02-16 NOTE — H&P (Signed)
Joanne Antigua, MD [address] [place], [place], [place], [address] [address], [place], [place], [place], [address] Phone: [phone]  Fax: [phone]  Primary Care Physician:  Lavera Guise, MD   Pre-Procedure History & Physical: HPI:  Joanne Maddox is a 50 y.o. female is here for a colonoscopy.   Past Medical History:  Diagnosis Date  . Anxiety   . Colon polyp 2010  . GERD (gastroesophageal reflux disease)   . Hypercholesteremia   . Hyperlipidemia   . Hypertension   . Migraine     Past Surgical History:  Procedure Laterality Date  . CESAREAN SECTION  1989  . COLONOSCOPY  2010   Dr Vira Agar  . CYSTOSCOPY N/A 11/09/2020   Procedure: CYSTOSCOPY;  Surgeon: Malachy Mood, MD;  Location: [facility] ORS;  Service: Gynecology;  Laterality: N/A;  . KNEE SURGERY Right 1999  . TOTAL LAPAROSCOPIC HYSTERECTOMY WITH SALPINGECTOMY Bilateral 11/09/2020   Procedure: TOTAL LAPAROSCOPIC HYSTERECTOMY WITH BILATERAL SALPINGECTOMY;  Surgeon: Malachy Mood, MD;  Location: [facility] ORS;  Service: Gynecology;  Laterality: Bilateral;    Prior to Admission medications   Medication Sig Start Date End Date Taking? Authorizing Provider  ALPRAZolam (XANAX) 0.25 MG tablet Take 1 tablet (0.25 mg total) by mouth at bedtime as needed for anxiety. 11/15/20  Yes Luiz Ochoa, NP  cyclobenzaprine (FLEXERIL) 10 MG tablet Take 1 tablet (10 mg total) by mouth 3 (three) times daily as needed for muscle spasms. 12/20/20  Yes Malachy Mood, MD  Ferrous Fumarate (HEMOCYTE) 324 (106 Fe) MG TABS tablet Take 1 tablet (106 mg of iron total) by mouth daily. 01/10/21  Yes Luiz Ochoa, NP  FLUoxetine (PROZAC) 20 MG tablet Take 1 tablet (20 mg total) by mouth daily. 11/15/20  Yes Luiz Ochoa, NP  fluticasone (FLONASE) 50 MCG/ACT nasal spray Place 2 sprays into both nostrils daily as needed for allergies. 11/15/20  Yes Luiz Ochoa, NP  gabapentin (NEURONTIN) 300 MG capsule Take by mouth. 01/19/21  04/19/21 Yes [provider]  ibuprofen (ADVIL) 600 MG tablet Take 1 tablet (600 mg total) by mouth every 6 (six) hours as needed for mild pain or cramping (pain). 11/21/20  Yes Malachy Mood, MD  lisinopril-hydrochlorothiazide (ZESTORETIC) 20-12.5 MG tablet Take 2 tablets by mouth daily. 11/15/20  Yes Luiz Ochoa, NP  lovastatin (MEVACOR) 40 MG tablet Take 1 tablet (40 mg total) by mouth 4 (four) times a week. At night 11/16/20  Yes Luiz Ochoa, NP  meloxicam (MOBIC) 7.5 MG tablet TAKE 1 TABLET BY MOUTH EVERY DAY 01/24/21  Yes Lavera Guise, MD  omeprazole (PRILOSEC) 40 MG capsule Take 1 capsule (40 mg total) by mouth daily as needed (acid reflux). Patient taking differently: Take 40 mg by mouth daily as needed (acid reflux). Pt take OTC 11/15/20  Yes Luiz Ochoa, NP  SUMAtriptan (IMITREX) 50 MG tablet Take 1 tablet (50 mg total) by mouth every 2 (two) hours as needed for migraine. May repeat in 2 hours if headache persists or recurs. 11/15/20  Yes Luiz Ochoa, NP  tizanidine (ZANAFLEX) 2 MG capsule Take 1 capsule (2 mg total) by mouth 3 (three) times daily. 12/28/20  Yes Luiz Ochoa, NP    Allergies as of 01/26/2021 - Review Complete 01/25/2021  Allergen Reaction Noted  . Bactrim [sulfamethoxazole-trimethoprim] Hives 01/05/2014    Family History  Problem Relation Age of Onset  . Diabetes Mother   . Hypertension Mother   . Heart disease Mother   .  Hypertension Father   . Heart disease Father   . Breast cancer Neg Hx     Social History   Socioeconomic History  . Marital status: Married    Spouse name: Not on file  . Number of children: Not on file  . Years of education: Not on file  . Highest education level: Not on file  Occupational History  . Not on file  Tobacco Use  . Smoking status: Never Smoker  . Smokeless tobacco: Never Used  Vaping Use  . Vaping Use: Never used  Substance and Sexual Activity  . Alcohol use: Yes    Comment: occasionally   . Drug use: No  . Sexual activity: Yes    Birth control/protection: Surgical    Comment: Hysterectomy  Other Topics Concern  . Not on file  Social History Narrative  . Not on file   Social Determinants of Health   Financial Resource Strain: Not on file  Food Insecurity: Not on file  Transportation Needs: Not on file  Physical Activity: Not on file  Stress: Not on file  Social Connections: Not on file  Intimate Partner Violence: Not on file    Review of Systems: See HPI, otherwise negative ROS  Physical Exam: BP 136/85   Pulse 72   Temp (!) 96.5 F (35.8 C) (Temporal)   Resp 20   Ht 4\' 9"  (1.448 m)   Wt 81.6 kg   LMP 09/27/2020   SpO2 100%   BMI 38.95 kg/m  General:   Alert,  pleasant and cooperative in NAD Head:  Normocephalic and atraumatic. Neck:  Supple; no masses or thyromegaly. Lungs:  Clear throughout to auscultation, normal respiratory effort.    Heart:  +S1, +S2, Regular rate and rhythm, No edema. Abdomen:  Soft, nontender and nondistended. Normal bowel sounds, without guarding, and without rebound.   Neurologic:  Alert and  oriented x4;  grossly normal neurologically.  Impression/Plan: Joanne Maddox is here for a colonoscopy to be performed for average risk screening.  Risks, benefits, limitations, and alternatives regarding  colonoscopy have been reviewed with the patient.  Questions have been answered.  All parties agreeable.   Virgel Manifold, MD  02/16/2021, 9:26 AM

## 2021-02-16 NOTE — Op Note (Signed)
[facility] Gastroenterology Patient Name: Joanne Maddox Procedure Date: 02/16/2021 10:42 AM MRN: [id number] Account #: [id number] Date of Birth: 11-Feb-1971 Admit Type: Outpatient Age: 50 Room: [facility] ENDO ROOM 2 Gender: Female Note Status: Finalized Procedure:             Colonoscopy Indications:           Screening for colorectal malignant neoplasm Providers:             Caleel Kiner B. Bonna Gains MD, MD Referring MD:          Lavera Guise, MD (Referring MD) Medicines:             Monitored Anesthesia Care Complications:         No immediate complications. Procedure:             Pre-Anesthesia Assessment:                        - Prior to the procedure, a History and Physical was                         performed, and patient medications, allergies and                         sensitivities were reviewed. The patient's tolerance                         of previous anesthesia was reviewed.                        - The risks and benefits of the procedure and the                         sedation options and risks were discussed with the                         patient. All questions were answered and informed                         consent was obtained.                        - Patient identification and proposed procedure were                         verified prior to the procedure by the physician, the                         nurse, the anesthetist and the technician. The                         procedure was verified in the pre-procedure area in                         the procedure room in the endoscopy suite.                        - ASA Grade Assessment: II - A patient with mild  systemic disease.                        - After reviewing the risks and benefits, the patient                         was deemed in satisfactory condition to undergo the                         procedure.                        After obtaining informed consent, the  colonoscope was                         passed under direct vision. Throughout the procedure,                         the patient's blood pressure, pulse, and oxygen                         saturations were monitored continuously. The                         Colonoscope was introduced through the anus and                         advanced to the the cecum, identified by appendiceal                         orifice and ileocecal valve. The colonoscopy was                         performed with ease. The patient tolerated the                         procedure well. The quality of the bowel preparation                         was fair. About 1 L of water used to clean the colon. Findings:      The perianal and digital rectal examinations were normal.      The rectum, sigmoid colon, descending colon, transverse colon, ascending       colon and cecum appeared normal.      Non-bleeding internal hemorrhoids were found during retroflexion.      No additional abnormalities were found on retroflexion. Impression:            - Preparation of the colon was fair.                        - The rectum, sigmoid colon, descending colon,                         transverse colon, ascending colon and cecum are normal.                        - Non-bleeding internal hemorrhoids.                        -  No specimens collected. Recommendation:        - Discharge patient to home.                        - High fiber diet.                        - Resume previous diet.                        - Continue present medications.                        - Repeat colonoscopy in 3 years, with 2 day prep,                         because the bowel preparation was suboptimal.                        - Return to primary care physician as previously                         scheduled.                        - The findings and recommendations were discussed with                         the patient.                        - The  findings and recommendations were discussed with                         the patient's family. Procedure Code(s):     --- Professional ---                        [phone], Colonoscopy, flexible; diagnostic, including                         collection of specimen(s) by brushing or washing, when                         performed (separate procedure) Diagnosis Code(s):     --- Professional ---                        Z12.11, Encounter for screening for malignant neoplasm                         of colon CPT copyright 2019 American Medical Association. All rights reserved. The codes documented in this report are preliminary and upon coder review may  be revised to meet current compliance requirements.  Vonda Antigua, MD Margretta Sidle B. Bonna Gains MD, MD 02/16/2021 11:21:24 AM This report has been signed electronically. Number of Addenda: 0 Note Initiated On: 02/16/2021 10:42 AM Scope Withdrawal Time: 0 hours 16 minutes 6 seconds  Total Procedure Duration: 0 hours 23 minutes 27 seconds       [facility]

## 2021-02-16 NOTE — Anesthesia Preprocedure Evaluation (Signed)
Anesthesia Evaluation  Patient identified by MRN, date of birth, ID band Patient awake    Reviewed: Allergy & Precautions, H&P , NPO status , Patient's Chart, lab work & pertinent test results  History of Anesthesia Complications Negative for: history of anesthetic complications  Airway Mallampati: II  TM Distance: >3 FB Neck ROM: full    Dental  (+) Teeth Intact   Pulmonary neg pulmonary ROS, neg sleep apnea, neg COPD,    breath sounds clear to auscultation       Cardiovascular hypertension, Pt. on medications (-) angina(-) Past MI and (-) Cardiac Stents (-) dysrhythmias  Rhythm:regular Rate:Normal     Neuro/Psych  Headaches, PSYCHIATRIC DISORDERS Anxiety    GI/Hepatic Neg liver ROS, Bowel prep,GERD  Controlled and Medicated,  Endo/Other  Morbid obesity  Renal/GU      Musculoskeletal   Abdominal (+) + obese,   Peds  Hematology  (+) Blood dyscrasia, anemia ,   Anesthesia Other Findings Anxiety Colon polyp GERD (gastroesophageal reflux disease) Hypercholesteremia Hyperlipidemia Hypertension Migraine   Reproductive/Obstetrics negative OB ROS                             Anesthesia Physical  Anesthesia Plan  ASA: II  Anesthesia Plan: General   Post-op Pain Management:    Induction: Intravenous  PONV Risk Score and Plan: 3 and Propofol infusion and TIVA  Airway Management Planned: Natural Airway and Nasal Cannula  Additional Equipment:   Intra-op Plan:   Post-operative Plan:   Informed Consent: I have reviewed the patients History and Physical, chart, labs and discussed the procedure including the risks, benefits and alternatives for the proposed anesthesia with the patient or authorized representative who has indicated his/her understanding and acceptance.       Plan Discussed with: Anesthesiologist, CRNA and Surgeon  Anesthesia Plan Comments:          Anesthesia Quick Evaluation

## 2021-02-17 ENCOUNTER — Other Ambulatory Visit: Payer: Self-pay

## 2021-02-17 MED ORDER — FLUOXETINE HCL 20 MG PO CAPS: 20.0000 mg | ORAL_CAPSULE | Freq: Every day | ORAL | 1 refills | Status: DC

## 2021-02-19 ENCOUNTER — Encounter: Payer: Self-pay | Admitting: Gastroenterology

## 2021-02-21 ENCOUNTER — Other Ambulatory Visit: Payer: Self-pay

## 2021-02-21 MED ORDER — FLUOXETINE HCL 20 MG PO CAPS
20.0000 mg | ORAL_CAPSULE | Freq: Every day | ORAL | 1 refills | Status: DC
Start: 2021-02-21 — End: 2022-02-08

## 2021-02-28 ENCOUNTER — Ambulatory Visit
Admission: RE | Admit: 2021-02-28 | Discharge: 2021-02-28 | Disposition: A | Discharge status: Home / Self Care | Payer: BC Managed Care – PPO | Source: Ambulatory Visit | Attending: Internal Medicine | Admitting: Internal Medicine

## 2021-02-28 ENCOUNTER — Other Ambulatory Visit: Payer: Self-pay

## 2021-02-28 DIAGNOSIS — R928 Other abnormal and inconclusive findings on diagnostic imaging of breast: Secondary | ICD-10-CM

## 2021-02-28 DIAGNOSIS — N6320 Unspecified lump in the left breast, unspecified quadrant: Secondary | ICD-10-CM

## 2021-02-28 DIAGNOSIS — R922 Inconclusive mammogram: Secondary | ICD-10-CM | POA: Diagnosis not present

## 2021-03-05 ENCOUNTER — Other Ambulatory Visit: Payer: Self-pay | Admitting: Internal Medicine

## 2021-03-05 DIAGNOSIS — M545 Low back pain, unspecified: Secondary | ICD-10-CM

## 2021-03-07 ENCOUNTER — Telehealth: Payer: Self-pay

## 2021-03-07 NOTE — Telephone Encounter (Signed)
Lvm for patient to return my call regarding mammogram order-Toni

## 2021-03-16 ENCOUNTER — Other Ambulatory Visit: Payer: Self-pay

## 2021-03-16 DIAGNOSIS — E782 Mixed hyperlipidemia: Secondary | ICD-10-CM

## 2021-03-16 MED ORDER — LOVASTATIN 40 MG PO TABS: 40.0000 mg | ORAL_TABLET | ORAL | 1 refills | Status: DC

## 2021-03-20 ENCOUNTER — Telehealth: Payer: Self-pay

## 2021-03-20 NOTE — Telephone Encounter (Signed)
Patient had diagnostic mammogram done on 02-28-21 at [place]. Joanne Maddox

## 2021-04-02 ENCOUNTER — Telehealth: Payer: Self-pay

## 2021-04-02 NOTE — Telephone Encounter (Signed)
Pt calling; had mammogram done; showed a water cyst left breast; would like AMS to look at results and see if he thinks further testing needs to be done b/c it's sore.  [phone]

## 2021-04-08 ENCOUNTER — Other Ambulatory Visit: Payer: Self-pay | Admitting: Internal Medicine

## 2021-04-08 DIAGNOSIS — M545 Low back pain, unspecified: Secondary | ICD-10-CM

## 2021-04-12 ENCOUNTER — Ambulatory Visit: Payer: BC Managed Care – PPO | Admitting: Physician Assistant

## 2021-04-19 ENCOUNTER — Encounter: Payer: Self-pay | Admitting: Physician Assistant

## 2021-04-19 ENCOUNTER — Ambulatory Visit: Payer: BC Managed Care – PPO | Admitting: Physician Assistant

## 2021-04-19 ENCOUNTER — Other Ambulatory Visit: Payer: Self-pay

## 2021-04-19 DIAGNOSIS — D509 Iron deficiency anemia, unspecified: Secondary | ICD-10-CM

## 2021-04-19 DIAGNOSIS — K219 Gastro-esophageal reflux disease without esophagitis: Secondary | ICD-10-CM

## 2021-04-19 DIAGNOSIS — F411 Generalized anxiety disorder: Secondary | ICD-10-CM | POA: Diagnosis not present

## 2021-04-19 DIAGNOSIS — E669 Obesity, unspecified: Secondary | ICD-10-CM

## 2021-04-19 DIAGNOSIS — I10 Essential (primary) hypertension: Secondary | ICD-10-CM

## 2021-04-19 DIAGNOSIS — E782 Mixed hyperlipidemia: Secondary | ICD-10-CM

## 2021-04-19 MED ORDER — ALPRAZOLAM 0.25 MG PO TABS
0.2500 mg | ORAL_TABLET | Freq: Every evening | ORAL | 1 refills | Status: DC | PRN
Start: 2021-04-19 — End: 2021-07-05

## 2021-04-19 NOTE — Patient Instructions (Signed)
Obesity, Adult Obesity is having too much body fat. Being obese means that your weight is morethan what is healthy for you. BMI is a number that explains how much body fat you have. If you have a BMI of 30 or more, you are obese. Obesity is often caused by eating or drinking morecalories than your body uses. Changing your lifestyle can help you lose weight. Obesity can cause serious health problems, such as: Stroke. Coronary artery disease (CAD). Type 2 diabetes. Some types of cancer, including cancers of the colon, breast, uterus, and gallbladder. Osteoarthritis. High blood pressure (hypertension). High cholesterol. Sleep apnea. Gallbladder stones. Infertility problems. What are the causes? Eating meals each day that are high in calories, sugar, and fat. Being born with genes that may make you more likely to become obese. Having a medical condition that causes obesity. Taking certain medicines. Sitting a lot (having a sedentary lifestyle). Not getting enough sleep. Drinking a lot of drinks that have sugar in them. What increases the risk? Having a family history of obesity. Being an [place] American woman. Being a Hispanic man. Living in an area with limited access to: [place], recreation centers, or sidewalks. Healthy food choices, such as grocery stores and farmers' markets. What are the signs or symptoms? The main sign is having too much body fat. How is this treated? Treatment for this condition often includes changing your lifestyle. Treatment may include: Changing your diet. This may include making a healthy meal plan. Exercise. This may include activity that causes your heart to beat faster (aerobic exercise) and strength training. Work with your doctor to design a program that works for you. Medicine to help you lose weight. This may be used if you are not able to lose 1 pound a week after 6 weeks of healthy eating and more exercise. Treating conditions that cause the  obesity. Surgery. Options may include gastric banding and gastric bypass. This may be done if: Other treatments have not helped to improve your condition. You have a BMI of 40 or higher. You have life-threatening health problems related to obesity. Follow these instructions at home: Eating and drinking  Follow advice from your doctor about what to eat and drink. Your doctor may tell you to: Limit fast food, sweets, and processed snack foods. Choose low-fat options. For example, choose low-fat milk instead of whole milk. Eat 5 or more servings of fruits or vegetables each day. Eat at home more often. This gives you more control over what you eat. Choose healthy foods when you eat out. Learn to read food labels. This will help you learn how much food is in 1 serving. Keep low-fat snacks available. Avoid drinks that have a lot of sugar in them. These include soda, fruit juice, iced tea with sugar, and flavored milk. Drink enough water to keep your pee (urine) pale yellow. Do not go on fad diets.  Physical activity Exercise often, as told by your doctor. Most adults should get up to 150 minutes of moderate-intensity exercise every week.Ask your doctor: What types of exercise are safe for you. How often you should exercise. Warm up and stretch before being active. Do slow stretching after being active (cool down). Rest between times of being active. Lifestyle Work with your doctor and a food expert (dietitian) to set a weight-loss goal that is best for you. Limit your screen time. Find ways to reward yourself that do not involve food. Do not drink alcohol if: Your doctor tells you not to drink.  You are pregnant, may be pregnant, or are planning to become pregnant. If you drink alcohol: Limit how much you use to: 0-1 drink a day for women. 0-2 drinks a day for men. Be aware of how much alcohol is in your drink. In the [place] one drink equals one 12 oz bottle of beer (355 mL), one 5 oz  glass of wine (148 mL), or one 1 oz glass of hard liquor (44 mL). General instructions Keep a weight-loss journal. This can help you keep track of: The food that you eat. How much exercise you get. Take over-the-counter and prescription medicines only as told by your doctor. Take vitamins and supplements only as told by your doctor. Think about joining a support group. Keep all follow-up visits as told by your doctor. This is important. Contact a doctor if: You cannot meet your weight loss goal after you have changed your diet and lifestyle for 6 weeks. Get help right away if you: Are having trouble breathing. Are having thoughts of harming yourself. Summary Obesity is having too much body fat. Being obese means that your weight is more than what is healthy for you. Work with your doctor to set a weight-loss goal. Get regular exercise as told by your doctor. This information is not intended to replace advice given to you by your health care provider. Make sure you discuss any questions you have with your healthcare provider. Document Revised: 05/14/2018 Document Reviewed: 05/14/2018 Elsevier Patient Education  2022 [employer].

## 2021-04-19 NOTE — Progress Notes (Signed)
[facility] [place], [place] [address]  Internal MEDICINE  Office Visit Note  Patient Name: Joanne Maddox  [id number]  [id number]  Date of Service: 04/19/2021  Chief Complaint  Patient presents with   Follow-up    Discuss meds, results   Gastroesophageal Reflux   Hyperlipidemia   Hypertension   Anxiety    HPI Pt is here for routine follow up -Back is doing better recently. Reports it had started after her hysterectomy, but is improving.  -Insurance wont cover mobic or iron and is asking about alternatives. Discussed ibuprofen would be an alternative but that I would not recommend she take that given IDA. Would try tylenol as needed instead. Additionally, she will start OTC iron supplement and will recheck labs in 1 month. If no significant improvement may need heme/onc referral. -had a colonoscopy and reports it was normal. Denies any signs of blood loss. -Abnormal mammogram and US done and discovered to be a benign cyst--recommend routine screening again in 1 yr -States she cannot lose weight--discussed working on portion control and tracking calories and fitness via [employer]. May be interested in metabolic test in the future -takes xanax at night to help her sleep. Discussed considering alternative such as trazodone in the future  Current Medication: Outpatient Encounter Medications as of 04/19/2021  Medication Sig   cyclobenzaprine (FLEXERIL) 10 MG tablet Take 1 tablet (10 mg total) by mouth 3 (three) times daily as needed for muscle spasms.   FLUoxetine (PROZAC) 20 MG capsule Take 1 capsule (20 mg total) by mouth daily.   fluticasone (FLONASE) 50 MCG/ACT nasal spray Place 2 sprays into both nostrils daily as needed for allergies.   gabapentin (NEURONTIN) 300 MG capsule Take by mouth.   ibuprofen (ADVIL) 600 MG tablet Take 1 tablet (600 mg total) by mouth every 6 (six) hours as needed for mild pain or cramping (pain).    lisinopril-hydrochlorothiazide (ZESTORETIC) 20-12.5 MG tablet Take 2 tablets by mouth daily.   lovastatin (MEVACOR) 40 MG tablet Take 1 tablet (40 mg total) by mouth 4 (four) times a week. At night   omeprazole (PRILOSEC) 40 MG capsule Take 1 capsule (40 mg total) by mouth daily as needed (acid reflux). (Patient taking differently: Take 40 mg by mouth daily as needed (acid reflux). Pt take OTC)   SUMAtriptan (IMITREX) 50 MG tablet Take 1 tablet (50 mg total) by mouth every 2 (two) hours as needed for migraine. May repeat in 2 hours if headache persists or recurs.   tizanidine (ZANAFLEX) 2 MG capsule Take 1 capsule (2 mg total) by mouth 3 (three) times daily.   [DISCONTINUED] ALPRAZolam (XANAX) 0.25 MG tablet Take 1 tablet (0.25 mg total) by mouth at bedtime as needed for anxiety.   ALPRAZolam (XANAX) 0.25 MG tablet Take 1 tablet (0.25 mg total) by mouth at bedtime as needed for anxiety.   [DISCONTINUED] Ferrous Fumarate (HEMOCYTE) 324 (106 Fe) MG TABS tablet Take 1 tablet (106 mg of iron total) by mouth daily. (Patient not taking: Reported on 04/19/2021)   [DISCONTINUED] meloxicam (MOBIC) 7.5 MG tablet Take 1 tablet (7.5 mg total) by mouth daily. As needed (Patient not taking: Reported on 04/19/2021)   No facility-administered encounter medications on file as of 04/19/2021.    Surgical History: Past Surgical History:  Procedure Laterality Date   ABDOMINAL HYSTERECTOMY     CESAREAN SECTION  09/24/1987   COLONOSCOPY  09/23/2008   Dr Vira Agar   COLONOSCOPY WITH PROPOFOL N/A 02/16/2021  Procedure: COLONOSCOPY WITH PROPOFOL;  Surgeon: Virgel Manifold, MD;  Location: [facility];  Service: Endoscopy;  Laterality: N/A;   CYSTOSCOPY N/A 11/09/2020   Procedure: CYSTOSCOPY;  Surgeon: Malachy Mood, MD;  Location: [facility] ORS;  Service: Gynecology;  Laterality: N/A;   KNEE SURGERY Right 09/23/1997   TOTAL LAPAROSCOPIC HYSTERECTOMY WITH SALPINGECTOMY Bilateral 11/09/2020   Procedure: TOTAL  LAPAROSCOPIC HYSTERECTOMY WITH BILATERAL SALPINGECTOMY;  Surgeon: Malachy Mood, MD;  Location: [facility] ORS;  Service: Gynecology;  Laterality: Bilateral;    Medical History: Past Medical History:  Diagnosis Date   Anxiety    Colon polyp 2010   GERD (gastroesophageal reflux disease)    Hypercholesteremia    Hyperlipidemia    Hypertension    Migraine     Family History: Family History  Problem Relation Age of Onset   Diabetes Mother    Hypertension Mother    Heart disease Mother    Hypertension Father    Heart disease Father    Breast cancer Neg Hx     Social History   Socioeconomic History   Marital status: Married    Spouse name: Not on file   Number of children: Not on file   Years of education: Not on file   Highest education level: Not on file  Occupational History   Not on file  Tobacco Use   Smoking status: Never   Smokeless tobacco: Never  Vaping Use   Vaping Use: Never used  Substance and Sexual Activity   Alcohol use: Yes    Comment: occasionally   Drug use: No   Sexual activity: Yes    Birth control/protection: Surgical    Comment: Hysterectomy  Other Topics Concern   Not on file  Social History Narrative   Not on file   Social Determinants of Health   Financial Resource Strain: Not on file  Food Insecurity: Not on file  Transportation Needs: Not on file  Physical Activity: Not on file  Stress: Not on file  Social Connections: Not on file  Intimate Partner Violence: Not on file      Review of Systems  Constitutional:  Negative for chills, fatigue and unexpected weight change.  HENT:  Negative for congestion, postnasal drip, rhinorrhea, sneezing and sore throat.   Eyes:  Negative for redness.  Respiratory:  Negative for cough, chest tightness and shortness of breath.   Cardiovascular:  Negative for chest pain and palpitations.  Gastrointestinal:  Negative for abdominal pain, constipation, diarrhea, nausea and vomiting.  Genitourinary:   Negative for dysuria and frequency.  Musculoskeletal:  Positive for arthralgias. Negative for back pain, joint swelling and neck pain.  Skin:  Negative for rash.  Neurological: Negative.  Negative for tremors and numbness.  Hematological:  Negative for adenopathy. Does not bruise/bleed easily.  Psychiatric/Behavioral:  Positive for sleep disturbance. Negative for behavioral problems (Depression) and suicidal ideas. The patient is not nervous/anxious.    Vital Signs: BP 110/74   Pulse (!) 108   Temp (!) 97.2 F (36.2 C)   Resp 16   Ht '4\' 9"'$  (1.448 m)   Wt 181 lb 3.2 oz (82.2 kg)   LMP 09/27/2020   SpO2 98%   BMI 39.21 kg/m    Physical Exam Vitals and nursing note reviewed.  Constitutional:      General: She is not in acute distress.    Appearance: She is well-developed. She is obese. She is not diaphoretic.  HENT:     Head: Normocephalic and atraumatic.  Mouth/Throat:     Pharynx: No oropharyngeal exudate.  Eyes:     Pupils: Pupils are equal, round, and reactive to light.  Neck:     Thyroid: No thyromegaly.     Vascular: No JVD.     Trachea: No tracheal deviation.  Cardiovascular:     Rate and Rhythm: Normal rate and regular rhythm.     Heart sounds: Normal heart sounds. No murmur heard.   No friction rub. No gallop.  Pulmonary:     Effort: Pulmonary effort is normal. No respiratory distress.     Breath sounds: No wheezing or rales.  Chest:     Chest wall: No tenderness.  Abdominal:     General: Bowel sounds are normal.     Palpations: Abdomen is soft.  Musculoskeletal:        General: Normal range of motion.     Cervical back: Normal range of motion and neck supple.  Lymphadenopathy:     Cervical: No cervical adenopathy.  Skin:    General: Skin is warm and dry.  Neurological:     Mental Status: She is alert and oriented to person, place, and time.     Cranial Nerves: No cranial nerve deficit.  Psychiatric:        Behavior: Behavior normal.         Thought Content: Thought content normal.        Judgment: Judgment normal.       Assessment/Plan: 1. Essential hypertension Stable, continue zestoretic  2. Generalized anxiety disorder May continue xanax as needed and continue prozac daily - ALPRAZolam (XANAX) 0.25 MG tablet; Take 1 tablet (0.25 mg total) by mouth at bedtime as needed for anxiety.  Dispense: 30 tablet; Refill: 1  Controlled Substance Database was reviewed by me for overdose risk score (ORS) Reviewed risks and possible side effects associated with taking opiates, benzodiazepines and other CNS depressants. Combination of these could cause dizziness and drowsiness. Advised patient not to drive or operate machinery when taking these medications, as patient's and other's life can be at risk and will have consequences. Patient verbalized understanding in this matter. Dependence and abuse for these drugs will be monitored closely. A Controlled substance policy and procedure is on file which allows [facility] to order a urine drug screen test at any visit. Patient understands and agrees with the plan  3. Iron deficiency anemia, unspecified iron deficiency anemia type Will supplement iron OTC and recheck labs in 1 month. May need to consider heme/onc referral  - Iron, TIBC and Ferritin Panel - CBC w/Diff/Platelet  4. Gastro-esophageal reflux disease without esophagitis Continue omeprazole as needed  5. Mixed hyperlipidemia Continue lovastatin  6. Obesity (BMI 35.0-39.9 without comorbidity) Will use myfitnesspal or similar app to watch calories and increase exercise. Consider metabolic test in future Obesity Counseling: Had a lengthy discussion regarding patients BMI and weight issues. Patient was instructed on portion control as well as increased activity. Also discussed caloric restrictions with trying to maintain intake less than 2000 Kcal. Discussions were made in accordance with the 5As of weight management.  Simple actions such as not eating late and if able to, taking a walk is suggested.    General Counseling: greysi pangelinan understanding of the findings of todays visit and agrees with plan of treatment. I have discussed any further diagnostic evaluation that may be needed or ordered today. We also reviewed her medications today. she has been encouraged to call the office with any questions or concerns  that should arise related to todays visit.    Orders Placed This Encounter  Procedures   Iron, TIBC and Ferritin Panel   CBC w/Diff/Platelet    Meds ordered this encounter  Medications   ALPRAZolam (XANAX) 0.25 MG tablet    Sig: Take 1 tablet (0.25 mg total) by mouth at bedtime as needed for anxiety.    Dispense:  30 tablet    Refill:  1    This patient was seen by Drema Dallas, PA-C in collaboration with Dr. Clayborn Bigness as a part of collaborative care agreement.   Total time spent:40 Minutes Time spent includes review of chart, medications, test results, and follow up plan with the patient.      Dr Lavera Guise Internal medicine

## 2021-06-29 ENCOUNTER — Other Ambulatory Visit: Payer: Self-pay

## 2021-06-29 DIAGNOSIS — I10 Essential (primary) hypertension: Secondary | ICD-10-CM

## 2021-06-29 MED ORDER — LISINOPRIL-HYDROCHLOROTHIAZIDE 20-12.5 MG PO TABS: 2.0000 | ORAL_TABLET | Freq: Every day | ORAL | 3 refills | Status: DC

## 2021-06-30 DIAGNOSIS — R0789 Other chest pain: Secondary | ICD-10-CM | POA: Diagnosis not present

## 2021-06-30 DIAGNOSIS — R0602 Shortness of breath: Secondary | ICD-10-CM | POA: Diagnosis not present

## 2021-06-30 DIAGNOSIS — R079 Chest pain, unspecified: Secondary | ICD-10-CM | POA: Diagnosis not present

## 2021-06-30 DIAGNOSIS — R200 Anesthesia of skin: Secondary | ICD-10-CM | POA: Diagnosis not present

## 2021-06-30 DIAGNOSIS — M6281 Muscle weakness (generalized): Secondary | ICD-10-CM | POA: Diagnosis not present

## 2021-06-30 DIAGNOSIS — I10 Essential (primary) hypertension: Secondary | ICD-10-CM | POA: Diagnosis not present

## 2021-06-30 DIAGNOSIS — R9431 Abnormal electrocardiogram [ECG] [EKG]: Secondary | ICD-10-CM | POA: Diagnosis not present

## 2021-06-30 DIAGNOSIS — E785 Hyperlipidemia, unspecified: Secondary | ICD-10-CM | POA: Diagnosis not present

## 2021-06-30 DIAGNOSIS — Z79899 Other long term (current) drug therapy: Secondary | ICD-10-CM | POA: Diagnosis not present

## 2021-06-30 DIAGNOSIS — F419 Anxiety disorder, unspecified: Secondary | ICD-10-CM | POA: Diagnosis not present

## 2021-07-02 ENCOUNTER — Telehealth: Payer: Self-pay

## 2021-07-02 DIAGNOSIS — R079 Chest pain, unspecified: Secondary | ICD-10-CM | POA: Diagnosis not present

## 2021-07-02 NOTE — Telephone Encounter (Signed)
Left vm to schedule ED follow up-Toni

## 2021-07-05 ENCOUNTER — Ambulatory Visit: Payer: BC Managed Care – PPO | Admitting: Physician Assistant

## 2021-07-05 ENCOUNTER — Other Ambulatory Visit: Payer: Self-pay

## 2021-07-05 ENCOUNTER — Encounter: Payer: Self-pay | Admitting: Physician Assistant

## 2021-07-05 DIAGNOSIS — E782 Mixed hyperlipidemia: Secondary | ICD-10-CM

## 2021-07-05 DIAGNOSIS — I10 Essential (primary) hypertension: Secondary | ICD-10-CM | POA: Diagnosis not present

## 2021-07-05 DIAGNOSIS — F411 Generalized anxiety disorder: Secondary | ICD-10-CM | POA: Diagnosis not present

## 2021-07-05 DIAGNOSIS — Z87898 Personal history of other specified conditions: Secondary | ICD-10-CM

## 2021-07-05 DIAGNOSIS — M545 Low back pain, unspecified: Secondary | ICD-10-CM

## 2021-07-05 MED ORDER — ALPRAZOLAM 0.25 MG PO TABS
0.2500 mg | ORAL_TABLET | Freq: Every evening | ORAL | 1 refills | Status: DC | PRN
Start: 2021-07-05 — End: 2024-02-19

## 2021-07-05 NOTE — Progress Notes (Signed)
[facility] [place], [place] [address]  Internal MEDICINE  Office Visit Note  Patient Name: Joanne Maddox  [id number]  [id number]  Date of Service: 07/09/2021  Chief Complaint  Patient presents with   Follow-up   chest pain and right arm pain    EKG was done twice at hospital and both normal.  Pain is now better   Back Pain    Had hysterectomy in 02/22 and since she has still been having pain in lower back, constant pain    HPI Pt is here for routine follow up after ED visit for CP -States she had CP for about 2 weeks and then started with arm pain and tingling worse on the right side so wen to the ED 10/8 where work up done and did not show any cardiac findings, though possible pericarditis was considered and was advised to try NSAIDs. No recurrence since this visit. They did not change any medications but suggested PCP f/u for stress test consideration.  -has been under some pressure lately, but denies any palpitations or CP currently -Takes xanax as needed before bed, had run out during the period she experienced the CP and was having difficulty sleeping at that time. Does think this may have impacted her symptoms. -Was seeing a spine specialist in [place] and was recommended to do PT, but couldn't afford it at the time and states she is still having trouble with low back pain. They had considered an MRI post PT but never did PT or had any other f/u and will call their office back to re-establish. -With her job she goes up and down a ladder  -Also discussed avoiding use of sumatriptan while ruling out CV disease and pt admits that she has not used this in a long time  Current Medication: Outpatient Encounter Medications as of 07/05/2021  Medication Sig   cyclobenzaprine (FLEXERIL) 10 MG tablet Take 1 tablet (10 mg total) by mouth 3 (three) times daily as needed for muscle spasms.   FLUoxetine (PROZAC) 20 MG capsule Take 1 capsule (20 mg total) by mouth  daily.   fluticasone (FLONASE) 50 MCG/ACT nasal spray Place 2 sprays into both nostrils daily as needed for allergies.   ibuprofen (ADVIL) 600 MG tablet Take 1 tablet (600 mg total) by mouth every 6 (six) hours as needed for mild pain or cramping (pain).   lisinopril-hydrochlorothiazide (ZESTORETIC) 20-12.5 MG tablet Take 2 tablets by mouth daily.   lovastatin (MEVACOR) 40 MG tablet Take 1 tablet (40 mg total) by mouth 4 (four) times a week. At night   omeprazole (PRILOSEC) 40 MG capsule Take 1 capsule (40 mg total) by mouth daily as needed (acid reflux). (Patient taking differently: Take 40 mg by mouth daily as needed (acid reflux). Pt take OTC)   SUMAtriptan (IMITREX) 50 MG tablet Take 1 tablet (50 mg total) by mouth every 2 (two) hours as needed for migraine. May repeat in 2 hours if headache persists or recurs.   [DISCONTINUED] ALPRAZolam (XANAX) 0.25 MG tablet Take 1 tablet (0.25 mg total) by mouth at bedtime as needed for anxiety.   ALPRAZolam (XANAX) 0.25 MG tablet Take 1 tablet (0.25 mg total) by mouth at bedtime as needed for anxiety.   meloxicam (MOBIC) 7.5 MG tablet Take 7.5 mg by mouth daily as needed.   [DISCONTINUED] gabapentin (NEURONTIN) 300 MG capsule Take by mouth.   [DISCONTINUED] tizanidine (ZANAFLEX) 2 MG capsule Take 1 capsule (2 mg total) by mouth  3 (three) times daily. (Patient not taking: Reported on 07/05/2021)   No facility-administered encounter medications on file as of 07/05/2021.    Surgical History: Past Surgical History:  Procedure Laterality Date   ABDOMINAL HYSTERECTOMY     CESAREAN SECTION  09/24/1987   COLONOSCOPY  09/23/2008   Dr Vira Agar   COLONOSCOPY WITH PROPOFOL N/A 02/16/2021   Procedure: COLONOSCOPY WITH PROPOFOL;  Surgeon: Virgel Manifold, MD;  Location: [facility];  Service: Endoscopy;  Laterality: N/A;   CYSTOSCOPY N/A 11/09/2020   Procedure: CYSTOSCOPY;  Surgeon: Malachy Mood, MD;  Location: [facility] OR[facility];  Service: Gynecology;   Laterality: N/A;   KNEE SURGERY Right 09/23/1997   TOTAL LAPAROSCOPIC HYSTERECTOMY WITH SALPINGECTOMY Bilateral 11/09/2020   Procedure: TOTAL LAPAROSCOPIC HYSTERECTOMY WITH BILATERAL SALPINGECTOMY;  Surgeon: Malachy Mood, MD;  Location: [facility] OR[facility];  Service: Gynecology;  Laterality: Bilateral;    Medical History: Past Medical History:  Diagnosis Date   Anxiety    Colon polyp 2010   GERD (gastroesophageal reflux disease)    Hypercholesteremia    Hyperlipidemia    Hypertension    Migraine     Family History: Family History  Problem Relation Age of Onset   Diabetes Mother    Hypertension Mother    Heart disease Mother    Hypertension Father    Heart disease Father    Breast cancer Neg Hx     Social History   Socioeconomic History   Marital status: Married    Spouse name: Not on file   Number of children: Not on file   Years of education: Not on file   Highest education level: Not on file  Occupational History   Not on file  Tobacco Use   Smoking status: Never   Smokeless tobacco: Never  Vaping Use   Vaping Use: Never used  Substance and Sexual Activity   Alcohol use: Yes    Comment: occasionally   Drug use: No   Sexual activity: Yes    Birth control/protection: Surgical    Comment: Hysterectomy  Other Topics Concern   Not on file  Social History Narrative   Not on file   Social Determinants of Health   Financial Resource Strain: Not on file  Food Insecurity: Not on file  Transportation Needs: Not on file  Physical Activity: Not on file  Stress: Not on file  Social Connections: Not on file  Intimate Partner Violence: Not on file      Review of Systems  Constitutional:  Negative for chills, fatigue and unexpected weight change.  HENT:  Negative for congestion, postnasal drip, rhinorrhea, sneezing and sore throat.   Eyes:  Negative for redness.  Respiratory:  Negative for cough, chest tightness and shortness of breath.   Cardiovascular:  Negative  for chest pain and palpitations.  Gastrointestinal:  Negative for abdominal pain, constipation, diarrhea, nausea and vomiting.  Genitourinary:  Negative for dysuria and frequency.  Musculoskeletal:  Positive for arthralgias and back pain. Negative for joint swelling and neck pain.  Skin:  Negative for rash.  Neurological: Negative.  Negative for tremors and numbness.  Hematological:  Negative for adenopathy. Does not bruise/bleed easily.  Psychiatric/Behavioral:  Positive for sleep disturbance. Negative for behavioral problems (Depression) and suicidal ideas. The patient is nervous/anxious.    Vital Signs: BP 128/78   Pulse 87   Temp 97.8 F (36.6 C)   Resp 16   Ht 4\' 9"  (1.448 m)   Wt 189 lb 3.2 oz (85.8 kg)   LMP  09/27/2020   SpO2 98%   BMI 40.94 kg/m    Physical Exam Vitals and nursing note reviewed.  Constitutional:      General: She is not in acute distress.    Appearance: She is well-developed. She is obese. She is not diaphoretic.  HENT:     Head: Normocephalic and atraumatic.     Mouth/Throat:     Pharynx: No oropharyngeal exudate.  Eyes:     Pupils: Pupils are equal, round, and reactive to light.  Neck:     Thyroid: No thyromegaly.     Vascular: No JVD.     Trachea: No tracheal deviation.  Cardiovascular:     Rate and Rhythm: Normal rate and regular rhythm.     Heart sounds: Normal heart sounds. No murmur heard.   No friction rub. No gallop.  Pulmonary:     Effort: Pulmonary effort is normal. No respiratory distress.     Breath sounds: No wheezing or rales.  Chest:     Chest wall: No tenderness.  Abdominal:     General: Bowel sounds are normal.     Palpations: Abdomen is soft.  Musculoskeletal:        General: Normal range of motion.     Cervical back: Normal range of motion and neck supple.  Lymphadenopathy:     Cervical: No cervical adenopathy.  Skin:    General: Skin is warm and dry.  Neurological:     Mental Status: She is alert and oriented to  person, place, and time.     Cranial Nerves: No cranial nerve deficit.  Psychiatric:        Behavior: Behavior normal.        Thought Content: Thought content normal.        Judgment: Judgment normal.       Assessment/Plan: 1. Essential hypertension Well-controlled, continue current medication  2. H/O chest pain Based on recent incidents of chest pain lasting for 2 weeks with radiation to right arm we will go ahead and order exercise stress test.  Patient advised to call or go straight to ED if any recurrence of chest pain.  May need to also consider echo - Exercise Tolerance Test; Future  3. Generalized anxiety disorder Continue Prozac daily and Xanax as needed.  Did discuss chest pain could have been due to increased stress and anxiety - ALPRAZolam (XANAX) 0.25 MG tablet; Take 1 tablet (0.25 mg total) by mouth at bedtime as needed for anxiety.  Dispense: 30 tablet; Refill: 1  4. Low back pain without sciatica, unspecified back pain laterality, unspecified chronicity Will follow-up with spine specialist/PMR to discuss PT versus MRI  5. Mixed hyperlipidemia Continue statin  General Counseling: emmalie haigh understanding of the findings of todays visit and agrees with plan of treatment. I have discussed any further diagnostic evaluation that may be needed or ordered today. We also reviewed her medications today. she has been encouraged to call the office with any questions or concerns that should arise related to todays visit.    Orders Placed This Encounter  Procedures   Exercise Tolerance Test    Meds ordered this encounter  Medications   ALPRAZolam (XANAX) 0.25 MG tablet    Sig: Take 1 tablet (0.25 mg total) by mouth at bedtime as needed for anxiety.    Dispense:  30 tablet    Refill:  1    This patient was seen by Drema Dallas, PA-C in collaboration with Dr. Clayborn Bigness as a part of  collaborative care agreement.   Total time spent:35 Minutes Time spent  includes review of chart, medications, test results, and follow up plan with the patient.      Dr Lavera Guise Internal medicine

## 2021-07-10 ENCOUNTER — Other Ambulatory Visit
Admission: RE | Admit: 2021-07-10 | Discharge: 2021-07-10 | Disposition: A | Discharge status: Home / Self Care | Payer: BC Managed Care – PPO | Source: Ambulatory Visit | Attending: Physician Assistant | Admitting: Physician Assistant

## 2021-07-10 ENCOUNTER — Other Ambulatory Visit: Payer: Self-pay

## 2021-07-10 DIAGNOSIS — Z20822 Contact with and (suspected) exposure to covid-19: Secondary | ICD-10-CM | POA: Insufficient documentation

## 2021-07-10 DIAGNOSIS — Z01812 Encounter for preprocedural laboratory examination: Secondary | ICD-10-CM | POA: Diagnosis not present

## 2021-07-10 LAB — SARS CORONAVIRUS 2 (TAT 6-24 HRS): SARS Coronavirus 2: NEGATIVE

## 2021-07-11 ENCOUNTER — Ambulatory Visit
Admission: RE | Admit: 2021-07-11 | Discharge: 2021-07-11 | Disposition: A | Discharge status: Home / Self Care | Payer: BC Managed Care – PPO | Source: Ambulatory Visit | Attending: Physician Assistant | Admitting: Physician Assistant

## 2021-07-11 DIAGNOSIS — Z8679 Personal history of other diseases of the circulatory system: Secondary | ICD-10-CM | POA: Insufficient documentation

## 2021-07-11 DIAGNOSIS — Z87898 Personal history of other specified conditions: Secondary | ICD-10-CM

## 2021-07-11 DIAGNOSIS — I200 Unstable angina: Secondary | ICD-10-CM | POA: Diagnosis not present

## 2021-07-16 ENCOUNTER — Telehealth: Payer: Self-pay

## 2021-07-17 ENCOUNTER — Other Ambulatory Visit: Payer: Self-pay | Admitting: Physician Assistant

## 2021-07-17 ENCOUNTER — Telehealth: Payer: Self-pay

## 2021-07-17 NOTE — Telephone Encounter (Signed)
Pt informed that results from Stress test were not finalized yet

## 2021-07-22 NOTE — Telephone Encounter (Signed)
error 

## 2021-07-23 ENCOUNTER — Ambulatory Visit: Payer: BC Managed Care – PPO | Admitting: Physician Assistant

## 2021-08-02 NOTE — Telephone Encounter (Signed)
Spoke to pt and informed her that stress test results were good

## 2021-08-09 ENCOUNTER — Ambulatory Visit: Payer: BC Managed Care – PPO | Admitting: Physician Assistant

## 2021-09-26 LAB — EXERCISE TOLERANCE TEST
Angina Index: 0
Estimated workload: 6.3
Exercise duration (min): 4 min
Exercise duration (sec): 26 s
MPHR: 170 {beats}/min
Peak HR: 170 {beats}/min
Percent HR: 100 %
Rest HR: 102 {beats}/min

## 2021-11-14 ENCOUNTER — Other Ambulatory Visit: Payer: Self-pay | Admitting: Internal Medicine

## 2021-11-14 DIAGNOSIS — I10 Essential (primary) hypertension: Secondary | ICD-10-CM

## 2021-11-23 DIAGNOSIS — M1712 Unilateral primary osteoarthritis, left knee: Secondary | ICD-10-CM | POA: Diagnosis not present

## 2021-12-12 ENCOUNTER — Other Ambulatory Visit: Payer: Self-pay | Admitting: Internal Medicine

## 2021-12-12 DIAGNOSIS — I10 Essential (primary) hypertension: Secondary | ICD-10-CM

## 2022-02-01 ENCOUNTER — Other Ambulatory Visit: Payer: Self-pay | Admitting: Physician Assistant

## 2022-02-01 DIAGNOSIS — I10 Essential (primary) hypertension: Secondary | ICD-10-CM

## 2022-02-08 ENCOUNTER — Encounter: Payer: Self-pay | Admitting: Physician Assistant

## 2022-02-08 ENCOUNTER — Ambulatory Visit (INDEPENDENT_AMBULATORY_CARE_PROVIDER_SITE_OTHER): Payer: BC Managed Care – PPO | Admitting: Physician Assistant

## 2022-02-08 VITALS — BP 121/87 | HR 80 | Temp 97.7°F | Resp 16 | Ht <= 58 in | Wt 183.8 lb

## 2022-02-08 DIAGNOSIS — E782 Mixed hyperlipidemia: Secondary | ICD-10-CM

## 2022-02-08 DIAGNOSIS — F411 Generalized anxiety disorder: Secondary | ICD-10-CM

## 2022-02-08 DIAGNOSIS — Z1231 Encounter for screening mammogram for malignant neoplasm of breast: Secondary | ICD-10-CM | POA: Diagnosis not present

## 2022-02-08 DIAGNOSIS — Z0001 Encounter for general adult medical examination with abnormal findings: Secondary | ICD-10-CM | POA: Diagnosis not present

## 2022-02-08 DIAGNOSIS — R300 Dysuria: Secondary | ICD-10-CM

## 2022-02-08 DIAGNOSIS — I10 Essential (primary) hypertension: Secondary | ICD-10-CM

## 2022-02-08 DIAGNOSIS — J069 Acute upper respiratory infection, unspecified: Secondary | ICD-10-CM

## 2022-02-08 DIAGNOSIS — R5383 Other fatigue: Secondary | ICD-10-CM

## 2022-02-08 DIAGNOSIS — E559 Vitamin D deficiency, unspecified: Secondary | ICD-10-CM

## 2022-02-08 DIAGNOSIS — E538 Deficiency of other specified B group vitamins: Secondary | ICD-10-CM

## 2022-02-08 DIAGNOSIS — R7989 Other specified abnormal findings of blood chemistry: Secondary | ICD-10-CM

## 2022-02-08 MED ORDER — SERTRALINE HCL 25 MG PO TABS: 25.0000 mg | ORAL_TABLET | Freq: Every day | ORAL | 3 refills | Status: DC

## 2022-02-08 MED ORDER — AZITHROMYCIN 250 MG PO TABS: ORAL_TABLET | ORAL | 0 refills | Status: AC

## 2022-02-08 NOTE — Progress Notes (Signed)
[facility] [place], [place] [address]  Internal MEDICINE  Office Visit Note  Patient Name: Joanne Maddox  [id number]  [id number]  Date of Service: 02/19/2022  Chief Complaint  Patient presents with   Annual Exam   Hypertension   Hyperlipidemia   Gastroesophageal Reflux   Hoarse    Has been hoarse for 2 weeks, no other symptoms - negative for COVID     HPI Pt is here for routine health maintenance examination -Been hoarse the past 2 weeks, had some congestion and postnasal drip. Her covid test was negative. Did cold and flu medication, mucinex DM, sinus meds. Did drink some tea and soup and some lozenges. Some coughing too. -still has some palpitations at times. Stress test previously done and was normal. Denies CP. -Interested in trying alternative prozac since she has been out off of this for the past few weeks rather than trying to increase dose -Change in stools, soft and pencil like now, no blood or pain with BM. Has been like this for awhile. Normal colonoscopy a year ago. If any further changes discussed considering GI consult, but would like to hold off for now. -Due for routine fasting labs and mammogram -UTD on pap done by OBGYN in 2021  Current Medication: Outpatient Encounter Medications as of 02/08/2022  Medication Sig   ALPRAZolam (XANAX) 0.25 MG tablet Take 1 tablet (0.25 mg total) by mouth at bedtime as needed for anxiety.   [EXPIRED] azithromycin (ZITHROMAX) 250 MG tablet Take 2 tablets on day 1, then 1 tablet daily on days 2 through 5   cyclobenzaprine (FLEXERIL) 10 MG tablet Take 1 tablet (10 mg total) by mouth 3 (three) times daily as needed for muscle spasms.   fluticasone (FLONASE) 50 MCG/ACT nasal spray Place 2 sprays into both nostrils daily as needed for allergies.   ibuprofen (ADVIL) 600 MG tablet Take 1 tablet (600 mg total) by mouth every 6 (six) hours as needed for mild pain or cramping (pain).    lisinopril-hydrochlorothiazide (ZESTORETIC) 20-12.5 MG tablet TAKE 2 TABLETS BY MOUTH DAILY   lovastatin (MEVACOR) 40 MG tablet Take 1 tablet (40 mg total) by mouth 4 (four) times a week. At night   meloxicam (MOBIC) 7.5 MG tablet Take 7.5 mg by mouth daily as needed.   omeprazole (PRILOSEC) 40 MG capsule Take 1 capsule (40 mg total) by mouth daily as needed (acid reflux). (Patient taking differently: Take 40 mg by mouth daily as needed (acid reflux). Pt take OTC)   sertraline (ZOLOFT) 25 MG tablet Take 1 tablet (25 mg total) by mouth daily.   SUMAtriptan (IMITREX) 50 MG tablet Take 1 tablet (50 mg total) by mouth every 2 (two) hours as needed for migraine. May repeat in 2 hours if headache persists or recurs.   [DISCONTINUED] FLUoxetine (PROZAC) 20 MG capsule Take 1 capsule (20 mg total) by mouth daily.   No facility-administered encounter medications on file as of 02/08/2022.    Surgical History: Past Surgical History:  Procedure Laterality Date   ABDOMINAL HYSTERECTOMY     CESAREAN SECTION  09/24/1987   COLONOSCOPY  09/23/2008   Dr Vira Agar   COLONOSCOPY WITH PROPOFOL N/A 02/16/2021   Procedure: COLONOSCOPY WITH PROPOFOL;  Surgeon: Virgel Manifold, MD;  Location: [facility] END[facility];  Service: Endoscopy;  Laterality: N/A;   CYSTOSCOPY N/A 11/09/2020   Procedure: CYSTOSCOPY;  Surgeon: Malachy Mood, MD;  Location: [facility] ORS;  Service: Gynecology;  Laterality: N/A;   KNEE SURGERY Right 09/23/1997  TOTAL LAPAROSCOPIC HYSTERECTOMY WITH SALPINGECTOMY Bilateral 11/09/2020   Procedure: TOTAL LAPAROSCOPIC HYSTERECTOMY WITH BILATERAL SALPINGECTOMY;  Surgeon: Malachy Mood, MD;  Location: [facility] ORS;  Service: Gynecology;  Laterality: Bilateral;    Medical History: Past Medical History:  Diagnosis Date   Anxiety    Colon polyp 2010   GERD (gastroesophageal reflux disease)    Hypercholesteremia    Hyperlipidemia    Hypertension    Migraine     Family History: Family History   Problem Relation Age of Onset   Diabetes Mother    Hypertension Mother    Heart disease Mother    Hypertension Father    Heart disease Father    Breast cancer Neg Hx       Review of Systems  Constitutional:  Negative for chills, fatigue and unexpected weight change.  HENT:  Positive for congestion, rhinorrhea, sore throat and voice change. Negative for postnasal drip and sneezing.   Eyes:  Negative for redness.  Respiratory:  Positive for cough. Negative for chest tightness, shortness of breath and wheezing.   Cardiovascular:  Negative for chest pain and palpitations.  Gastrointestinal:  Negative for abdominal pain, blood in stool, constipation, diarrhea, nausea and vomiting.  Genitourinary:  Negative for dysuria and frequency.  Musculoskeletal:  Positive for arthralgias. Negative for back pain, joint swelling and neck pain.  Skin:  Negative for rash.  Neurological: Negative.  Negative for tremors and numbness.  Hematological:  Negative for adenopathy. Does not bruise/bleed easily.  Psychiatric/Behavioral:  Positive for sleep disturbance. Negative for behavioral problems (Depression) and suicidal ideas. The patient is nervous/anxious.     Vital Signs: BP 121/87   Pulse 80   Temp 97.7 F (36.5 C)   Resp 16   Ht '4\' 9"'$  (1.448 m)   Wt 183 lb 12.8 oz (83.4 kg)   LMP 09/27/2020   SpO2 99%   BMI 39.77 kg/m    Physical Exam Vitals and nursing note reviewed.  Constitutional:      General: She is not in acute distress.    Appearance: She is well-developed. She is obese. She is not diaphoretic.  HENT:     Head: Normocephalic and atraumatic.     Mouth/Throat:     Pharynx: No oropharyngeal exudate.  Eyes:     Pupils: Pupils are equal, round, and reactive to light.  Neck:     Thyroid: No thyromegaly.     Vascular: No JVD.     Trachea: No tracheal deviation.  Cardiovascular:     Rate and Rhythm: Normal rate and regular rhythm.     Heart sounds: Normal heart sounds. No  murmur heard.   No friction rub. No gallop.  Pulmonary:     Effort: Pulmonary effort is normal. No respiratory distress.     Breath sounds: No wheezing or rales.  Chest:     Chest wall: No tenderness.  Abdominal:     General: Bowel sounds are normal.     Palpations: Abdomen is soft.     Tenderness: There is no abdominal tenderness.  Musculoskeletal:        General: Normal range of motion.     Cervical back: Normal range of motion and neck supple.  Lymphadenopathy:     Cervical: No cervical adenopathy.  Skin:    General: Skin is warm and dry.  Neurological:     Mental Status: She is alert and oriented to person, place, and time.     Cranial Nerves: No cranial nerve deficit.  Psychiatric:  Behavior: Behavior normal.        Thought Content: Thought content normal.        Judgment: Judgment normal.     LABS: Recent Results (from the past 2160 hour(s))  UA/M w/rflx Culture, Routine     Status: None   Collection Time: 02/08/22  2:26 PM   Specimen: Urine   Urine  Result Value Ref Range   Specific Gravity, UA 1.011 1.005 - 1.030   pH, UA 6.5 5.0 - 7.5   Color, UA Yellow Yellow   Appearance Ur Clear Clear   Leukocytes,UA Negative Negative   Protein,UA Negative Negative/Trace   Glucose, UA Negative Negative   Ketones, UA Negative Negative   RBC, UA Negative Negative   Bilirubin, UA Negative Negative   Urobilinogen, Ur 0.2 0.2 - 1.0 mg/dL   Nitrite, UA Negative Negative   Microscopic Examination Comment     Comment: Microscopic follows if indicated.   Microscopic Examination See below:     Comment: Microscopic was indicated and was performed.   Urinalysis Reflex Comment     Comment: This specimen will not reflex to a Urine Culture.  Microscopic Examination     Status: None   Collection Time: 02/08/22  2:26 PM   Urine  Result Value Ref Range   WBC, UA None seen 0 - 5 /hpf   RBC None seen 0 - 2 /hpf   Epithelial Cells (non renal) 0-10 0 - 10 /hpf   Casts None seen  None seen /lpf   Bacteria, UA None seen None seen/Few       Assessment/Plan: 1. Encounter for general adult medical examination with abnormal findings CPE performed, routine fasting labs ordered, up-to-date on colonoscopy and Pap.  Due for mammogram  2. Upper respiratory tract infection, unspecified type We will start on Z-Pak and may continue Mucinex and nasal sprays as needed - azithromycin (ZITHROMAX) 250 MG tablet; Take 2 tablets on day 1, then 1 tablet daily on days 2 through 5  Dispense: 6 tablet; Refill: 0  3. Essential hypertension Stable, continue current medications  4. Generalized anxiety disorder We will stop Prozac as patient has already been out of this for a few weeks so no need to taper further and will start on Zoloft instead.  May need to titrate up - sertraline (ZOLOFT) 25 MG tablet; Take 1 tablet (25 mg total) by mouth daily.  Dispense: 30 tablet; Refill: 3  5. Visit for screening mammogram - MM DIGITAL SCREENING BILATERAL; Future  6. Mixed hyperlipidemia - Lipid Panel With LDL/HDL Ratio  7. Vitamin D deficiency - VITAMIN D 25 Hydroxy (Vit-D Deficiency, Fractures)  8. B12 deficiency - B12 and Folate Panel  9. Abnormal thyroid blood test - TSH + free T4  10. Other fatigue - CBC w/Diff/Platelet - Comprehensive metabolic panel - Iron, TIBC and Ferritin Panel  11. Dysuria - UA/M w/rflx Culture, Routine   General Counseling: kennidy lamke understanding of the findings of todays visit and agrees with plan of treatment. I have discussed any further diagnostic evaluation that may be needed or ordered today. We also reviewed her medications today. she has been encouraged to call the office with any questions or concerns that should arise related to todays visit.    Counseling:    Orders Placed This Encounter  Procedures   Microscopic Examination   MM DIGITAL SCREENING BILATERAL   UA/M w/rflx Culture, Routine   CBC w/Diff/Platelet    Comprehensive metabolic panel   TSH + free  T4   Lipid Panel With LDL/HDL Ratio   Iron, TIBC and Ferritin Panel   B12 and Folate Panel   VITAMIN D 25 Hydroxy (Vit-D Deficiency, Fractures)    Meds ordered this encounter  Medications   sertraline (ZOLOFT) 25 MG tablet    Sig: Take 1 tablet (25 mg total) by mouth daily.    Dispense:  30 tablet    Refill:  3   azithromycin (ZITHROMAX) 250 MG tablet    Sig: Take 2 tablets on day 1, then 1 tablet daily on days 2 through 5    Dispense:  6 tablet    Refill:  0    This patient was seen by Drema Dallas, PA-C in collaboration with Dr. Clayborn Bigness as a part of collaborative care agreement.  Total time spent:35 Minutes  Time spent includes review of chart, medications, test results, and follow up plan with the patient.     Lavera Guise, MD  Internal Medicine

## 2022-02-09 LAB — UA/M W/RFLX CULTURE, ROUTINE
Bilirubin, UA: NEGATIVE
Glucose, UA: NEGATIVE
Ketones, UA: NEGATIVE
Leukocytes,UA: NEGATIVE
Nitrite, UA: NEGATIVE
Protein,UA: NEGATIVE
RBC, UA: NEGATIVE
Specific Gravity, UA: 1.011 (ref 1.005–1.030)
Urobilinogen, Ur: 0.2 mg/dL (ref 0.2–1.0)
pH, UA: 6.5 (ref 5.0–7.5)

## 2022-02-09 LAB — MICROSCOPIC EXAMINATION
Bacteria, UA: NONE SEEN
Casts: NONE SEEN /lpf
RBC, Urine: NONE SEEN /hpf (ref 0–2)
WBC, UA: NONE SEEN /hpf (ref 0–5)

## 2022-02-22 DIAGNOSIS — R7989 Other specified abnormal findings of blood chemistry: Secondary | ICD-10-CM | POA: Diagnosis not present

## 2022-02-22 DIAGNOSIS — E782 Mixed hyperlipidemia: Secondary | ICD-10-CM | POA: Diagnosis not present

## 2022-02-22 DIAGNOSIS — E538 Deficiency of other specified B group vitamins: Secondary | ICD-10-CM | POA: Diagnosis not present

## 2022-02-22 DIAGNOSIS — E559 Vitamin D deficiency, unspecified: Secondary | ICD-10-CM | POA: Diagnosis not present

## 2022-02-22 DIAGNOSIS — R5383 Other fatigue: Secondary | ICD-10-CM | POA: Diagnosis not present

## 2022-02-23 LAB — COMPREHENSIVE METABOLIC PANEL
ALT: 16 IU/L (ref 0–32)
AST: 18 IU/L (ref 0–40)
Albumin/Globulin Ratio: 1.5 (ref 1.2–2.2)
Albumin: 4.4 g/dL (ref 3.8–4.9)
Alkaline Phosphatase: 140 IU/L — ABNORMAL HIGH (ref 44–121)
BUN/Creatinine Ratio: 15 (ref 9–23)
BUN: 13 mg/dL (ref 6–24)
Bilirubin Total: 0.2 mg/dL (ref 0.0–1.2)
CO2: 27 mmol/L (ref 20–29)
Calcium: 10 mg/dL (ref 8.7–10.2)
Chloride: 98 mmol/L (ref 96–106)
Creatinine, Ser: 0.85 mg/dL (ref 0.57–1.00)
Globulin, Total: 2.9 g/dL (ref 1.5–4.5)
Glucose: 110 mg/dL — ABNORMAL HIGH (ref 70–99)
Potassium: 4 mmol/L (ref 3.5–5.2)
Sodium: 140 mmol/L (ref 134–144)
Total Protein: 7.3 g/dL (ref 6.0–8.5)
eGFR: 83 mL/min/{1.73_m2} (ref >59)

## 2022-02-23 LAB — LIPID PANEL WITH LDL/HDL RATIO
Cholesterol, Total: 284 mg/dL — ABNORMAL HIGH (ref 100–199)
HDL: 49 mg/dL (ref >39)
LDL Chol Calc (NIH): 214 mg/dL — ABNORMAL HIGH (ref 0–99)
LDL/HDL Ratio: 4.4 ratio — ABNORMAL HIGH (ref 0.0–3.2)
Triglycerides: 118 mg/dL (ref 0–149)
VLDL Cholesterol Cal: 21 mg/dL (ref 5–40)

## 2022-02-23 LAB — CBC WITH DIFFERENTIAL/PLATELET
Basophils Absolute: 0 10*3/uL (ref 0.0–0.2)
Basos: 0 %
EOS (ABSOLUTE): 0.1 10*3/uL (ref 0.0–0.4)
Eos: 1 %
Hematocrit: 35.4 % (ref 34.0–46.6)
Hemoglobin: 11.8 g/dL (ref 11.1–15.9)
Immature Grans (Abs): 0 10*3/uL (ref 0.0–0.1)
Immature Granulocytes: 0 %
Lymphocytes Absolute: 1.8 10*3/uL (ref 0.7–3.1)
Lymphs: 22 %
MCH: 28.9 pg (ref 26.6–33.0)
MCHC: 33.3 g/dL (ref 31.5–35.7)
MCV: 87 fL (ref 79–97)
Monocytes Absolute: 0.4 10*3/uL (ref 0.1–0.9)
Monocytes: 5 %
Neutrophils Absolute: 5.8 10*3/uL (ref 1.4–7.0)
Neutrophils: 72 %
Platelets: 358 10*3/uL (ref 150–450)
RBC: 4.09 x10E6/uL (ref 3.77–5.28)
RDW: 14.3 % (ref 11.7–15.4)
WBC: 8.1 10*3/uL (ref 3.4–10.8)

## 2022-02-23 LAB — IRON,TIBC AND FERRITIN PANEL
Ferritin: 76 ng/mL (ref 15–150)
Iron Saturation: 16 % (ref 15–55)
Iron: 57 ug/dL (ref 27–159)
Total Iron Binding Capacity: 365 ug/dL (ref 250–450)
UIBC: 308 ug/dL (ref 131–425)

## 2022-02-23 LAB — B12 AND FOLATE PANEL
Folate: 10.5 ng/mL (ref >3.0)
Vitamin B-12: 676 pg/mL (ref 232–1245)

## 2022-02-23 LAB — VITAMIN D 25 HYDROXY (VIT D DEFICIENCY, FRACTURES): Vit D, 25-Hydroxy: 23.9 ng/mL — ABNORMAL LOW (ref 30.0–100.0)

## 2022-02-23 LAB — TSH+FREE T4
Free T4: 1.03 ng/dL (ref 0.82–1.77)
TSH: 0.899 u[IU]/mL (ref 0.450–4.500)

## 2022-02-26 ENCOUNTER — Telehealth: Payer: Self-pay

## 2022-02-26 NOTE — Telephone Encounter (Signed)
-----   Message from Mylinda Latina, PA-C sent at 02/25/2022  3:16 PM EDT ----- Please let her know her sugar is a little elevated and we need to monitor. Also her cholesterol is significantly elevated and will need to increase her lovastatin--can increase to 1.5tabs per day for a few weeks then consider increasing to '80mg'$  total if tolerated. Will also need to avoid fried and fatty foods and can add fish oil supplement to help as well. her vit D is low and can supplement OTC.

## 2022-02-26 NOTE — Telephone Encounter (Signed)
Spoke to pt and informed her of her lab results and advised per Lauren her sugar is a little elevated and we need to monitor. Also her cholesterol is significantly elevated and will need to increase her lovastatin--can increase to 1.5tabs per day for a few weeks then consider increasing to '80mg'$  total if tolerated. Will also need to avoid fried and fatty foods and can add fish oil supplement to help as well. her vit D is low and can supplement OTC.          Pt understood and will increase her lovastatin and do the OTC supplements

## 2022-03-04 ENCOUNTER — Other Ambulatory Visit: Payer: Self-pay | Admitting: Physician Assistant

## 2022-03-04 DIAGNOSIS — I10 Essential (primary) hypertension: Secondary | ICD-10-CM

## 2022-04-09 ENCOUNTER — Other Ambulatory Visit: Payer: Self-pay | Admitting: Physician Assistant

## 2022-04-09 DIAGNOSIS — I10 Essential (primary) hypertension: Secondary | ICD-10-CM

## 2022-04-09 NOTE — Telephone Encounter (Signed)
Should pt be seen every 6 months

## 2022-04-28 ENCOUNTER — Other Ambulatory Visit: Payer: Self-pay | Admitting: Internal Medicine

## 2022-04-28 DIAGNOSIS — E782 Mixed hyperlipidemia: Secondary | ICD-10-CM

## 2022-04-28 NOTE — Telephone Encounter (Signed)
Discuss with DFK

## 2022-04-29 NOTE — Telephone Encounter (Signed)
Need to call the pt to find out if she takes 40 or 60 mg

## 2022-04-30 ENCOUNTER — Telehealth: Payer: Self-pay

## 2022-04-30 NOTE — Telephone Encounter (Signed)
Try to call pt voicemail is full regarding her lovastatin dose

## 2022-05-01 ENCOUNTER — Other Ambulatory Visit: Payer: Self-pay

## 2022-05-01 ENCOUNTER — Telehealth: Payer: Self-pay

## 2022-05-01 DIAGNOSIS — G8929 Other chronic pain: Secondary | ICD-10-CM | POA: Diagnosis not present

## 2022-05-01 DIAGNOSIS — M1711 Unilateral primary osteoarthritis, right knee: Secondary | ICD-10-CM | POA: Diagnosis not present

## 2022-05-01 DIAGNOSIS — E782 Mixed hyperlipidemia: Secondary | ICD-10-CM

## 2022-05-01 DIAGNOSIS — M1731 Unilateral post-traumatic osteoarthritis, right knee: Secondary | ICD-10-CM | POA: Diagnosis not present

## 2022-05-01 DIAGNOSIS — M25561 Pain in right knee: Secondary | ICD-10-CM | POA: Diagnosis not present

## 2022-05-01 MED ORDER — LOVASTATIN 40 MG PO TABS: 40.0000 mg | ORAL_TABLET | Freq: Every day | ORAL | 1 refills | Status: DC

## 2022-05-01 NOTE — Telephone Encounter (Signed)
Spoke with pt is taking lovastatin 40 mg daily  and as per dfk change direction and send for 90 days

## 2022-05-25 ENCOUNTER — Other Ambulatory Visit: Payer: Self-pay | Admitting: Physician Assistant

## 2022-05-25 DIAGNOSIS — I10 Essential (primary) hypertension: Secondary | ICD-10-CM

## 2022-06-05 ENCOUNTER — Encounter: Payer: Self-pay | Admitting: Nurse Practitioner

## 2022-06-05 ENCOUNTER — Ambulatory Visit: Payer: BC Managed Care – PPO | Admitting: Nurse Practitioner

## 2022-06-05 VITALS — BP 131/71 | HR 95 | Temp 96.1°F | Resp 16 | Ht <= 58 in | Wt 183.2 lb

## 2022-06-05 DIAGNOSIS — Z8781 Personal history of (healed) traumatic fracture: Secondary | ICD-10-CM

## 2022-06-05 DIAGNOSIS — Z711 Person with feared health complaint in whom no diagnosis is made: Secondary | ICD-10-CM

## 2022-06-05 DIAGNOSIS — R229 Localized swelling, mass and lump, unspecified: Secondary | ICD-10-CM | POA: Diagnosis not present

## 2022-06-05 DIAGNOSIS — L299 Pruritus, unspecified: Secondary | ICD-10-CM

## 2022-06-05 DIAGNOSIS — R220 Localized swelling, mass and lump, head: Secondary | ICD-10-CM

## 2022-06-05 NOTE — Progress Notes (Cosign Needed Addendum)
[facility] [place], [place] [address]  Internal MEDICINE  Office Visit Note  Patient Name: Joanne Maddox  [id number]  [id number]  Date of Service: 06/05/2022  Chief Complaint  Patient presents with   Acute Visit    Lump/ maybe something bite her.      HPI Jasmine presents for an acute sick visit for possible Bug bite in the area of her right clavicle. Scratched the area due to itching, might just be inflammed, prior right collarbone fracture several years ago as a child. Area is still slightly itchy.    Current Medication:  Outpatient Encounter Medications as of 06/05/2022  Medication Sig   ALPRAZolam (XANAX) 0.25 MG tablet Take 1 tablet (0.25 mg total) by mouth at bedtime as needed for anxiety.   cyclobenzaprine (FLEXERIL) 10 MG tablet Take 1 tablet (10 mg total) by mouth 3 (three) times daily as needed for muscle spasms.   fluticasone (FLONASE) 50 MCG/ACT nasal spray Place 2 sprays into both nostrils daily as needed for allergies.   ibuprofen (ADVIL) 600 MG tablet Take 1 tablet (600 mg total) by mouth every 6 (six) hours as needed for mild pain or cramping (pain).   lisinopril-hydrochlorothiazide (ZESTORETIC) 20-12.5 MG tablet TAKE 2 TABLETS BY MOUTH DAILY   lovastatin (MEVACOR) 40 MG tablet Take 1 tablet (40 mg total) by mouth at bedtime. At night   meloxicam (MOBIC) 7.5 MG tablet Take 7.5 mg by mouth daily as needed.   omeprazole (PRILOSEC) 40 MG capsule Take 1 capsule (40 mg total) by mouth daily as needed (acid reflux). (Patient taking differently: Take 40 mg by mouth daily as needed (acid reflux). Pt take OTC)   sertraline (ZOLOFT) 25 MG tablet Take 1 tablet (25 mg total) by mouth daily.   SUMAtriptan (IMITREX) 50 MG tablet Take 1 tablet (50 mg total) by mouth every 2 (two) hours as needed for migraine. May repeat in 2 hours if headache persists or recurs.   No facility-administered encounter medications on file as of 06/05/2022.      Medical  History: Past Medical History:  Diagnosis Date   Anxiety    Colon polyp 2010   GERD (gastroesophageal reflux disease)    Hypercholesteremia    Hyperlipidemia    Hypertension    Migraine      Vital Signs: BP 131/71   Pulse 95   Temp (!) 96.1 F (35.6 C)   Resp 16   Ht '4\' 9"'$  (1.448 m)   Wt 183 lb 3.2 oz (83.1 kg)   LMP 09/27/2020   SpO2 99%   BMI 39.64 kg/m    Review of Systems  Constitutional:  Negative for chills, diaphoresis, fatigue and unexpected weight change.  HENT:  Negative for congestion, ear pain, postnasal drip, rhinorrhea, sinus pressure, sneezing and sore throat.   Eyes:  Negative for photophobia, discharge, redness, itching and visual disturbance.  Respiratory: Negative.  Negative for cough, chest tightness, shortness of breath and wheezing.   Cardiovascular: Negative.  Negative for chest pain, palpitations and leg swelling.  Gastrointestinal:  Negative for abdominal pain, constipation, diarrhea, nausea and vomiting.  Genitourinary:  Negative for dysuria, flank pain and frequency.  Musculoskeletal:  Negative for arthralgias, back pain, gait problem, joint swelling and neck pain.  Skin:  Negative for color change and rash.       Itching of skin around right collar bone (clavicle.)  Allergic/Immunologic: Negative for environmental allergies and food allergies.  Neurological: Negative.  Negative for dizziness, tremors,  numbness and headaches.  Hematological:  Negative for adenopathy. Does not bruise/bleed easily.  Psychiatric/Behavioral:  Negative for agitation, behavioral problems (Depression), hallucinations, sleep disturbance and suicidal ideas. The patient is not nervous/anxious.     Physical Exam Vitals and nursing note reviewed.  Constitutional:      General: She is not in acute distress.    Appearance: She is well-developed. She is obese. She is not diaphoretic.  HENT:     Head: Normocephalic and atraumatic.     Mouth/Throat:     Pharynx: No  oropharyngeal exudate.  Eyes:     Pupils: Pupils are equal, round, and reactive to light.  Neck:     Thyroid: No thyromegaly.     Vascular: No JVD.     Trachea: No tracheal deviation.  Cardiovascular:     Rate and Rhythm: Normal rate and regular rhythm.     Heart sounds: Normal heart sounds. No murmur heard.    No friction rub. No gallop.  Pulmonary:     Effort: Pulmonary effort is normal. No respiratory distress.     Breath sounds: No wheezing or rales.  Chest:     Chest wall: No tenderness.  Abdominal:     General: Bowel sounds are normal.     Palpations: Abdomen is soft.  Musculoskeletal:        General: Normal range of motion.     Cervical back: Normal range of motion and neck supple.  Lymphadenopathy:     Cervical: No cervical adenopathy.  Skin:    General: Skin is warm and dry.  Neurological:     Mental Status: She is alert and oriented to person, place, and time.     Cranial Nerves: No cranial nerve deficit.  Psychiatric:        Behavior: Behavior normal.        Thought Content: Thought content normal.        Judgment: Judgment normal.       Assessment/Plan: 1. Itchy skin The itchy area is over the medial bony prominence of the right clavicle, cannot rule out insect bite or sting but no bite or puncture site was visualized, itching is tolerable. May use OTC hydrocortisone cream prn for itching.   2. History of broken collarbone Previous fractured right collarbone years ago, has not had any issues, no injury or fall.   3. Person with feared complaint, no diagnosis made No bug bite seen, no rash, area that she is concerned about is the medial bony prominence of her right clavicle. There may be a very small lipoma or epidermoid cyst but it could also just be adipose tissue. Discussed possible signs or symptoms to watch out for if this were to become a more concerning issue. She will call the clinic if she has any problems.    General Counseling: donta mcinroy  understanding of the findings of todays visit and agrees with plan of treatment. I have discussed any further diagnostic evaluation that may be needed or ordered today. We also reviewed her medications today. she has been encouraged to call the office with any questions or concerns that should arise related to todays visit.    Counseling:    No orders of the defined types were placed in this encounter.   No orders of the defined types were placed in this encounter.   Return if symptoms worsen or fail to improve.  [place] Controlled Substance Database was reviewed by me for overdose risk score (ORS)  Time spent:10 Minutes  Time spent with patient included reviewing progress notes, labs, imaging studies, and discussing plan for follow up.   This patient was seen by Jonetta Osgood, FNP-C in collaboration with Dr. Clayborn Bigness as a part of collaborative care agreement.  Johana Hopkinson R. Valetta Fuller, MSN, FNP-C Internal Medicine

## 2022-06-09 ENCOUNTER — Encounter: Payer: Self-pay | Admitting: Nurse Practitioner

## 2022-06-14 ENCOUNTER — Ambulatory Visit
Admission: RE | Admit: 2022-06-14 | Discharge: 2022-06-14 | Disposition: A | Discharge status: Home / Self Care | Payer: BC Managed Care – PPO | Source: Ambulatory Visit | Attending: Physician Assistant | Admitting: Physician Assistant

## 2022-06-14 DIAGNOSIS — Z1231 Encounter for screening mammogram for malignant neoplasm of breast: Secondary | ICD-10-CM | POA: Diagnosis not present

## 2022-06-20 ENCOUNTER — Other Ambulatory Visit: Payer: Self-pay | Admitting: Internal Medicine

## 2022-06-20 DIAGNOSIS — I10 Essential (primary) hypertension: Secondary | ICD-10-CM

## 2022-06-24 ENCOUNTER — Other Ambulatory Visit: Payer: Self-pay | Admitting: Physician Assistant

## 2022-06-24 DIAGNOSIS — F411 Generalized anxiety disorder: Secondary | ICD-10-CM

## 2022-07-10 ENCOUNTER — Ambulatory Visit: Payer: BC Managed Care – PPO | Admitting: Licensed Practical Nurse

## 2022-07-25 ENCOUNTER — Other Ambulatory Visit: Payer: Self-pay | Admitting: Physician Assistant

## 2022-07-25 DIAGNOSIS — E782 Mixed hyperlipidemia: Secondary | ICD-10-CM

## 2022-08-05 ENCOUNTER — Encounter: Payer: Self-pay | Admitting: Obstetrics & Gynecology

## 2022-08-05 ENCOUNTER — Ambulatory Visit (INDEPENDENT_AMBULATORY_CARE_PROVIDER_SITE_OTHER): Payer: BC Managed Care – PPO | Admitting: Obstetrics & Gynecology

## 2022-08-05 VITALS — BP 117/77 | HR 84 | Ht <= 58 in | Wt 186.0 lb

## 2022-08-05 DIAGNOSIS — Z01419 Encounter for gynecological examination (general) (routine) without abnormal findings: Secondary | ICD-10-CM

## 2022-08-05 NOTE — Progress Notes (Signed)
Subjective:    Joanne Maddox is a 51 y.o. married P1 (70 yo daughter and 2 grandsons) who presents for an annual exam. The patient has no complaints today.She still has some longstanding LBP.  The patient is sexually active. GYN screening history: last pap: was normal. The patient wears seatbelts: yes. The patient participates in regular exercise: not asked. Has the patient ever been transfused or tattooed?: yes. The patient reports that there is not domestic violence in her life.   Menstrual History: OB History     Gravida  1   Para  1   Term  1   Preterm      AB      Living  1      SAB      IAB      Ectopic      Multiple      Live Births  1        Obstetric Comments  1st Menstrual Cycle:    1st Pregnancy:  17          Patient's last menstrual period was 09/27/2020.    The following portions of the patient's history were reviewed and updated as appropriate: allergies, current medications, past family history, past medical history, past social history, past surgical history, and problem list.  Review of Systems Pertinent items are noted in HPI.  She is up to date with mammogram and colonoscopy. 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 She has been married for 22 years.    Objective:    BP 117/77   Pulse 84   Ht '4\' 9"'$  (1.448 m)   Wt 186 lb (84.4 kg)   LMP 09/27/2020   BMI 40.25 kg/m   General Appearance:    Alert, cooperative, no distress, appears stated age  Head:    Normocephalic, without obvious abnormality, atraumatic  Eyes:    PERRL, conjunctiva/corneas clear, EOM's intact, fundi    benign, both eyes  Ears:    Normal TM's and external ear canals, both ears  Nose:   Nares normal, septum midline, mucosa normal, no drainage    or sinus tenderness  Throat:   Lips, mucosa, and tongue normal; teeth and gums normal  Neck:   Supple, symmetrical, trachea midline, no adenopathy;    thyroid:  no  enlargement/tenderness/nodules; no carotid   bruit or JVD  Back:     Symmetric, no curvature, ROM normal, no CVA tenderness  Lungs:     Clear to auscultation bilaterally, respirations unlabored  Chest Wall:    No tenderness or deformity   Heart:    Regular rate and rhythm, S1 and S2 normal, no murmur, rub   or gallop  Breast Exam:    No tenderness, masses, or nipple abnormality  Abdomen:     Soft, non-tender, bowel sounds active all four quadrants,    no masses, no organomegaly  Genitalia:    Normal female without lesion, discharge or tenderness, spec exam shows a normal cervix. The bimanual exam reveals no masses or tenderness     Extremities:   Extremities normal, atraumatic, no cyanosis or edema  Pulses:   2+ and symmetric all extremities  Skin:   Skin color, texture, turgor normal, no rashes or lesions  Lymph nodes:   Cervical, supraclavicular, and axillary nodes normal  Neurologic:   CNII-XII intact, normal strength, sensation and reflexes    throughout  .    Assessment:  Healthy female exam.  Long-standing lower back pain   Plan:     Pap next year  I rec'd either PT or chiro. She will decide and let me know if she wants a referral to PT.

## 2022-09-24 ENCOUNTER — Other Ambulatory Visit: Payer: Self-pay

## 2022-09-24 DIAGNOSIS — G43009 Migraine without aura, not intractable, without status migrainosus: Secondary | ICD-10-CM

## 2022-09-24 MED ORDER — SUMATRIPTAN SUCCINATE 50 MG PO TABS: 50.0000 mg | ORAL_TABLET | ORAL | 0 refills | Status: DC | PRN

## 2022-10-16 ENCOUNTER — Encounter: Payer: Self-pay | Admitting: Nurse Practitioner

## 2022-10-16 ENCOUNTER — Ambulatory Visit (INDEPENDENT_AMBULATORY_CARE_PROVIDER_SITE_OTHER): Payer: BC Managed Care – PPO | Admitting: Nurse Practitioner

## 2022-10-16 VITALS — BP 115/85 | HR 84 | Temp 97.5°F | Resp 16 | Ht <= 58 in | Wt 185.0 lb

## 2022-10-16 DIAGNOSIS — M6281 Muscle weakness (generalized): Secondary | ICD-10-CM

## 2022-10-16 DIAGNOSIS — M791 Myalgia, unspecified site: Secondary | ICD-10-CM

## 2022-10-16 DIAGNOSIS — R5383 Other fatigue: Secondary | ICD-10-CM | POA: Diagnosis not present

## 2022-10-16 MED ORDER — CELECOXIB 200 MG PO CAPS: 200.0000 mg | ORAL_CAPSULE | Freq: Two times a day (BID) | ORAL | 1 refills | Status: DC

## 2022-10-16 NOTE — Progress Notes (Signed)
[facility] [place], [place] [address]  Internal MEDICINE  Office Visit Note  Patient Name: Joanne Maddox  [id number]  [id number]  Date of Service: 10/16/2022  Chief Complaint  Patient presents with   Acute Visit    Tired/ muscle ache      HPI Joanne Maddox presents for an acute sick visit for fatigue and muscle aches for months Low back pain since hysterectomy was done and has sciatica sometimes.  Takes naprosyn sometimes which does help sometimes.  Wants to do labs to check for possible causes.     Current Medication:  Outpatient Encounter Medications as of 10/16/2022  Medication Sig   ALPRAZolam (XANAX) 0.25 MG tablet Take 1 tablet (0.25 mg total) by mouth at bedtime as needed for anxiety.   celecoxib (CELEBREX) 200 MG capsule Take 1 capsule (200 mg total) by mouth 2 (two) times daily.   cyclobenzaprine (FLEXERIL) 10 MG tablet Take 1 tablet (10 mg total) by mouth 3 (three) times daily as needed for muscle spasms.   fluticasone (FLONASE) 50 MCG/ACT nasal spray Place 2 sprays into both nostrils daily as needed for allergies.   ibuprofen (ADVIL) 600 MG tablet Take 1 tablet (600 mg total) by mouth every 6 (six) hours as needed for mild pain or cramping (pain).   lisinopril-hydrochlorothiazide (ZESTORETIC) 20-12.5 MG tablet TAKE 2 TABLETS BY MOUTH EVERY DAY   lovastatin (MEVACOR) 40 MG tablet TAKE 1 TABLET (40 MG TOTAL) BY MOUTH 4 (FOUR) TIMES A WEEK. AT NIGHT   omeprazole (PRILOSEC) 40 MG capsule Take 1 capsule (40 mg total) by mouth daily as needed (acid reflux). (Patient taking differently: Take 40 mg by mouth daily as needed (acid reflux). Pt take OTC)   sertraline (ZOLOFT) 25 MG tablet TAKE 1 TABLET (25 MG TOTAL) BY MOUTH DAILY.   SUMAtriptan (IMITREX) 50 MG tablet Take 1 tablet (50 mg total) by mouth every 2 (two) hours as needed for migraine. May repeat in 2 hours if headache persists or recurs.   [DISCONTINUED] meloxicam (MOBIC) 7.5 MG tablet Take 7.5 mg  by mouth daily as needed.   [DISCONTINUED] naproxen (NAPROSYN) 500 MG tablet Take 500 mg by mouth 2 (two) times daily.   No facility-administered encounter medications on file as of 10/16/2022.      Medical History: Past Medical History:  Diagnosis Date   Anxiety    Colon polyp 2010   GERD (gastroesophageal reflux disease)    Hypercholesteremia    Hyperlipidemia    Hypertension    Migraine      Vital Signs: BP 115/85   Pulse 84   Temp (!) 97.5 F (36.4 C)   Resp 16   Ht '4\' 9"'$  (1.448 m)   Wt 185 lb (83.9 kg)   LMP 09/27/2020   SpO2 99%   BMI 40.03 kg/m    Review of Systems  Physical Exam    Assessment/Plan:   General Counseling: Joanne Maddox verbalizes understanding of the findings of todays visit and agrees with plan of treatment. I have discussed any further diagnostic evaluation that may be needed or ordered today. We also reviewed her medications today. she has been encouraged to call the office with any questions or concerns that should arise related to todays visit.    Counseling:    Orders Placed This Encounter  Procedures   TSH + free T4   Iron, TIBC and Ferritin Panel   B12 and Folate Panel   CBC with Differential/Platelet   Vitamin D (25  hydroxy)   Sed Rate (ESR)   C-reactive protein    Meds ordered this encounter  Medications   celecoxib (CELEBREX) 200 MG capsule    Sig: Take 1 capsule (200 mg total) by mouth 2 (two) times daily.    Dispense:  60 capsule    Refill:  1    Return in about 1 month (around 11/16/2022) for F/U, eval new med, Labs with Ander Purpura PCP.  [place] Controlled Substance Database was reviewed by me for overdose risk score (ORS)  Time spent:*** Minutes Time spent with patient included reviewing progress notes, labs, imaging studies, and discussing plan for follow up.   This patient was seen by Jonetta Osgood, FNP-C in collaboration with Dr. Clayborn Bigness as a part of collaborative care agreement.  Joanne Molony R. Valetta Fuller, MSN,  FNP-C Internal Medicine

## 2022-10-29 ENCOUNTER — Other Ambulatory Visit: Payer: Self-pay | Admitting: Physician Assistant

## 2022-10-29 DIAGNOSIS — G43009 Migraine without aura, not intractable, without status migrainosus: Secondary | ICD-10-CM

## 2022-11-08 IMAGING — MG MM DIGITAL SCREENING BILAT W/ TOMO AND CAD
6 of 10 series · 6 of 30 positions shown · non-contrast
Comparison: Previous exam(s).

CLINICAL DATA: Screening.

EXAM:
DIGITAL SCREENING BILATERAL MAMMOGRAM WITH TOMOSYNTHESIS AND CAD
TECHNIQUE: Bilateral screening digital craniocaudal and mediolateral oblique
mammograms were obtained. Bilateral screening digital breast
tomosynthesis was performed. The images were evaluated with
computer-aided detection.

[L CC synth-2D]
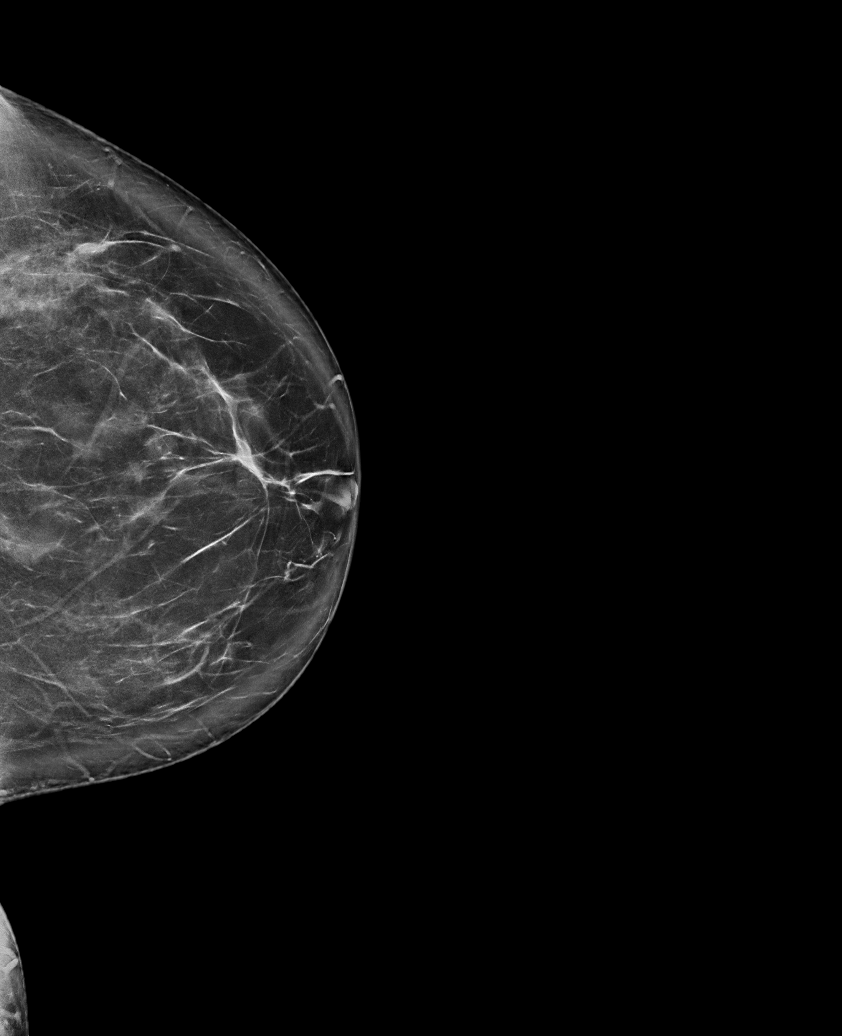

[R MLO synth-2D]
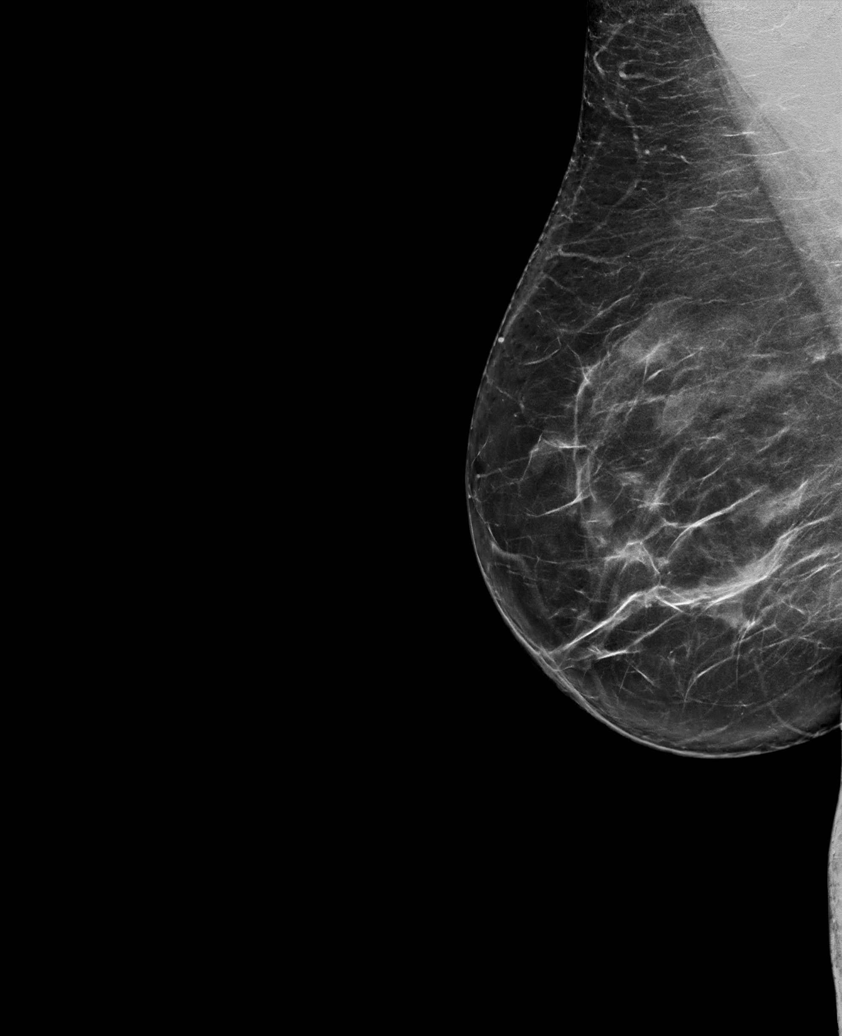

[R CC synth-2D (1 of 2)]
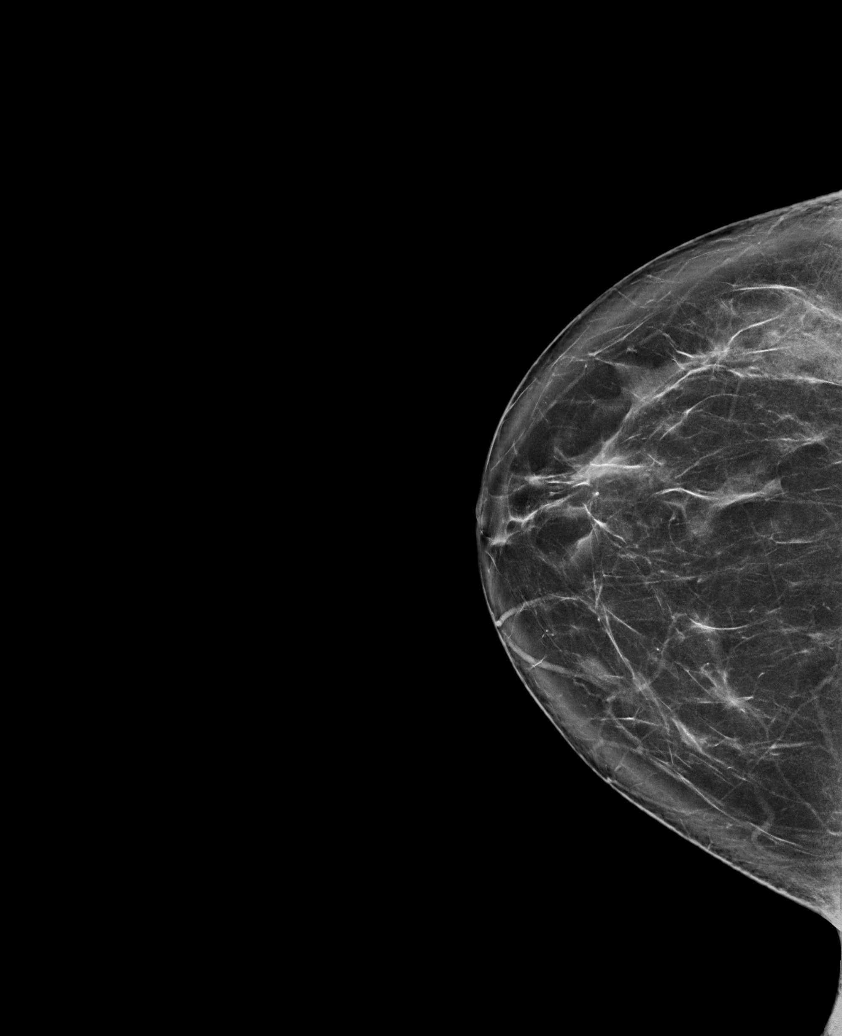

[L MLO synth-2D]
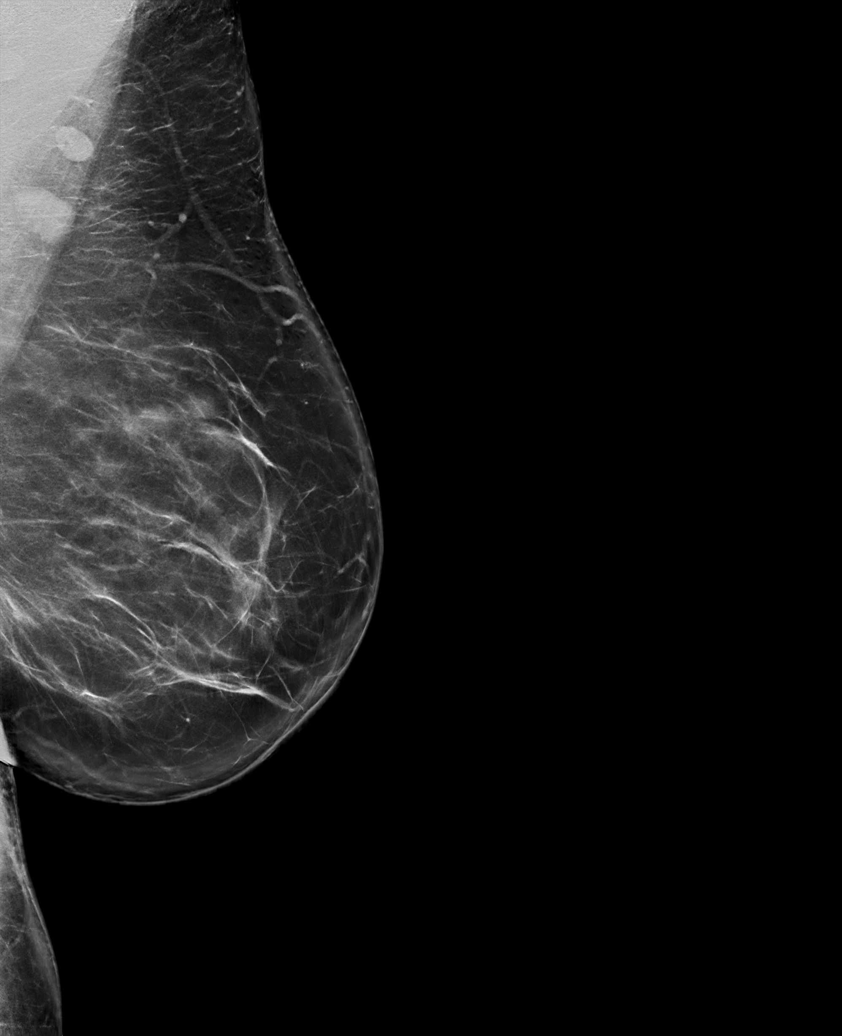

[R CC synth-2D (2 of 2)]
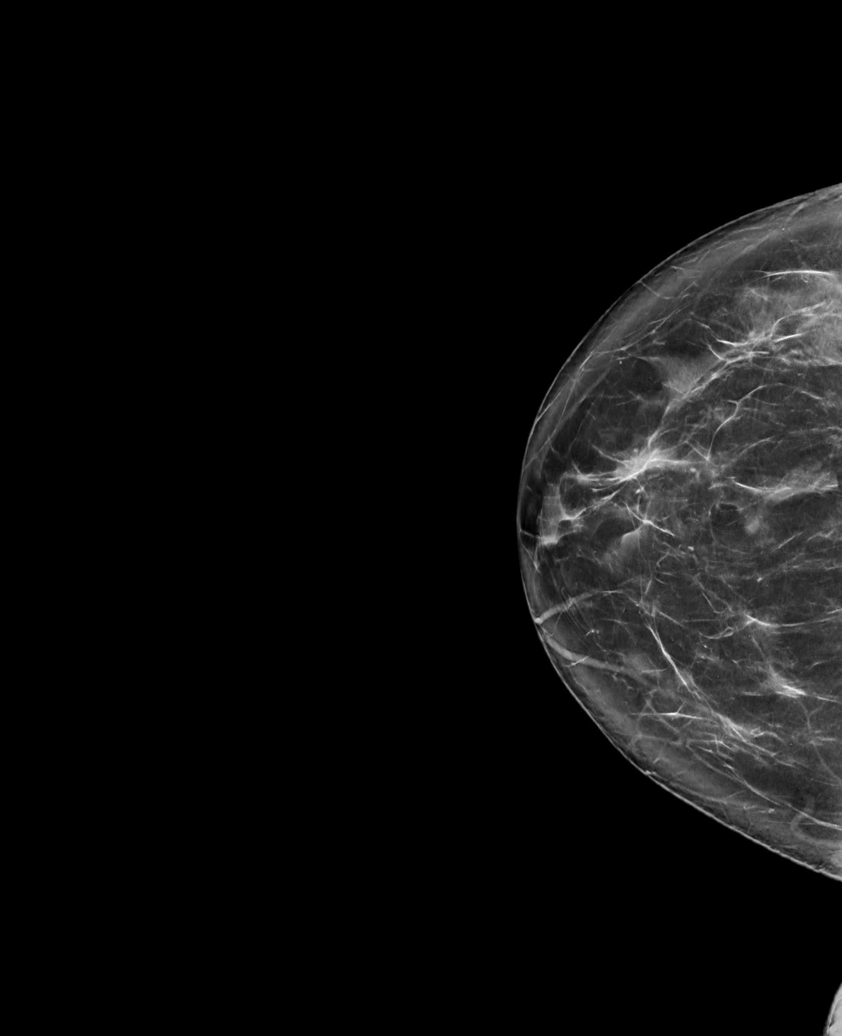

[L CC tomo · tomo slice 48/95.0]
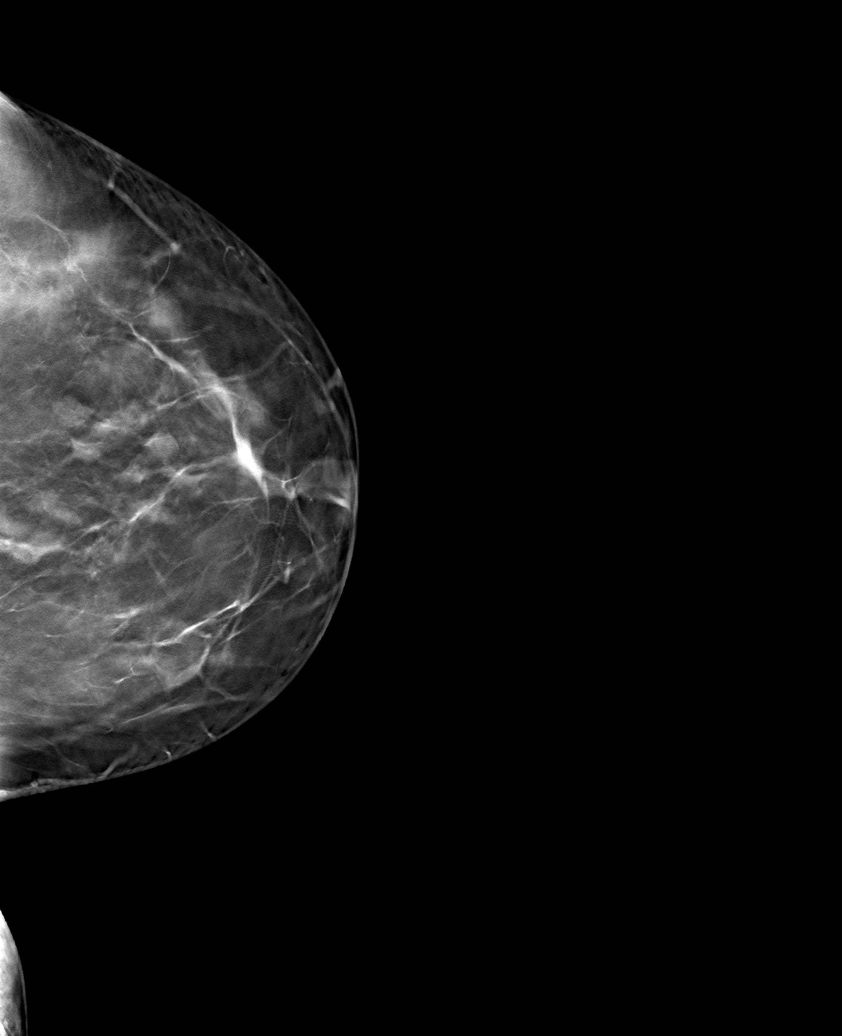

[6 of 30 positions shown; findings below may reference images not displayed]

ACR Breast Density Category c: The breast tissue is heterogeneously
dense, which may obscure small masses.
FINDINGS: In the left breast, a possible mass warrants further evaluation. In
the right breast, no findings suspicious for malignancy.
IMPRESSION: Further evaluation is suggested for a possible mass in the left
breast.

RECOMMENDATION:
Diagnostic mammogram and possibly ultrasound of the left breast.
(Code:C3-P-XXB)

The patient will be contacted regarding the findings, and additional
imaging will be scheduled.

BI-RADS CATEGORY  0: Incomplete. Need additional imaging evaluation
and/or prior mammograms for comparison.

## 2022-11-13 DIAGNOSIS — M11211 Other chondrocalcinosis, right shoulder: Secondary | ICD-10-CM | POA: Diagnosis not present

## 2022-11-13 DIAGNOSIS — M19011 Primary osteoarthritis, right shoulder: Secondary | ICD-10-CM | POA: Diagnosis not present

## 2022-11-13 DIAGNOSIS — M898X1 Other specified disorders of bone, shoulder: Secondary | ICD-10-CM | POA: Diagnosis not present

## 2022-11-13 DIAGNOSIS — M25511 Pain in right shoulder: Secondary | ICD-10-CM | POA: Diagnosis not present

## 2022-11-14 ENCOUNTER — Ambulatory Visit: Payer: BC Managed Care – PPO | Admitting: Physician Assistant

## 2022-11-18 ENCOUNTER — Other Ambulatory Visit: Payer: Self-pay | Admitting: Internal Medicine

## 2022-11-18 DIAGNOSIS — F411 Generalized anxiety disorder: Secondary | ICD-10-CM

## 2022-12-10 DIAGNOSIS — M19011 Primary osteoarthritis, right shoulder: Secondary | ICD-10-CM | POA: Diagnosis not present

## 2022-12-21 ENCOUNTER — Other Ambulatory Visit: Payer: Self-pay | Admitting: Physician Assistant

## 2022-12-21 DIAGNOSIS — E782 Mixed hyperlipidemia: Secondary | ICD-10-CM

## 2022-12-26 ENCOUNTER — Other Ambulatory Visit: Payer: Self-pay | Admitting: Nurse Practitioner

## 2022-12-26 DIAGNOSIS — M791 Myalgia, unspecified site: Secondary | ICD-10-CM

## 2022-12-27 ENCOUNTER — Other Ambulatory Visit: Payer: Self-pay | Admitting: Physician Assistant

## 2022-12-27 DIAGNOSIS — I10 Essential (primary) hypertension: Secondary | ICD-10-CM

## 2023-01-27 ENCOUNTER — Other Ambulatory Visit: Payer: Self-pay | Admitting: Physician Assistant

## 2023-01-27 DIAGNOSIS — G43009 Migraine without aura, not intractable, without status migrainosus: Secondary | ICD-10-CM

## 2023-02-12 ENCOUNTER — Telehealth: Payer: Self-pay

## 2023-02-12 ENCOUNTER — Other Ambulatory Visit: Payer: Self-pay | Admitting: Physician Assistant

## 2023-02-12 DIAGNOSIS — D509 Iron deficiency anemia, unspecified: Secondary | ICD-10-CM

## 2023-02-12 DIAGNOSIS — R7989 Other specified abnormal findings of blood chemistry: Secondary | ICD-10-CM

## 2023-02-12 DIAGNOSIS — M791 Myalgia, unspecified site: Secondary | ICD-10-CM

## 2023-02-12 DIAGNOSIS — E538 Deficiency of other specified B group vitamins: Secondary | ICD-10-CM

## 2023-02-12 DIAGNOSIS — R5383 Other fatigue: Secondary | ICD-10-CM

## 2023-02-12 DIAGNOSIS — E559 Vitamin D deficiency, unspecified: Secondary | ICD-10-CM

## 2023-02-12 DIAGNOSIS — E782 Mixed hyperlipidemia: Secondary | ICD-10-CM

## 2023-02-12 NOTE — Telephone Encounter (Signed)
Patient notified

## 2023-02-13 DIAGNOSIS — M791 Myalgia, unspecified site: Secondary | ICD-10-CM | POA: Diagnosis not present

## 2023-02-13 DIAGNOSIS — R5383 Other fatigue: Secondary | ICD-10-CM | POA: Diagnosis not present

## 2023-02-13 DIAGNOSIS — R7989 Other specified abnormal findings of blood chemistry: Secondary | ICD-10-CM | POA: Diagnosis not present

## 2023-02-13 DIAGNOSIS — E559 Vitamin D deficiency, unspecified: Secondary | ICD-10-CM | POA: Diagnosis not present

## 2023-02-13 DIAGNOSIS — D509 Iron deficiency anemia, unspecified: Secondary | ICD-10-CM | POA: Diagnosis not present

## 2023-02-13 DIAGNOSIS — E538 Deficiency of other specified B group vitamins: Secondary | ICD-10-CM | POA: Diagnosis not present

## 2023-02-13 DIAGNOSIS — E782 Mixed hyperlipidemia: Secondary | ICD-10-CM | POA: Diagnosis not present

## 2023-02-14 ENCOUNTER — Ambulatory Visit (INDEPENDENT_AMBULATORY_CARE_PROVIDER_SITE_OTHER): Payer: BC Managed Care – PPO | Admitting: Physician Assistant

## 2023-02-14 ENCOUNTER — Encounter: Payer: Self-pay | Admitting: Physician Assistant

## 2023-02-14 VITALS — BP 115/80 | HR 92 | Temp 98.2°F | Resp 16 | Ht <= 58 in | Wt 181.6 lb

## 2023-02-14 DIAGNOSIS — R300 Dysuria: Secondary | ICD-10-CM | POA: Diagnosis not present

## 2023-02-14 DIAGNOSIS — E782 Mixed hyperlipidemia: Secondary | ICD-10-CM | POA: Diagnosis not present

## 2023-02-14 DIAGNOSIS — E559 Vitamin D deficiency, unspecified: Secondary | ICD-10-CM

## 2023-02-14 DIAGNOSIS — F411 Generalized anxiety disorder: Secondary | ICD-10-CM | POA: Diagnosis not present

## 2023-02-14 DIAGNOSIS — I10 Essential (primary) hypertension: Secondary | ICD-10-CM | POA: Diagnosis not present

## 2023-02-14 DIAGNOSIS — R7301 Impaired fasting glucose: Secondary | ICD-10-CM

## 2023-02-14 DIAGNOSIS — Z0001 Encounter for general adult medical examination with abnormal findings: Secondary | ICD-10-CM

## 2023-02-14 DIAGNOSIS — Z01419 Encounter for gynecological examination (general) (routine) without abnormal findings: Secondary | ICD-10-CM

## 2023-02-14 DIAGNOSIS — G4700 Insomnia, unspecified: Secondary | ICD-10-CM

## 2023-02-14 LAB — CBC WITH DIFFERENTIAL/PLATELET
Basophils Absolute: 0 10*3/uL (ref 0.0–0.2)
Basos: 1 %
EOS (ABSOLUTE): 0.1 10*3/uL (ref 0.0–0.4)
Eos: 1 %
Hematocrit: 38.4 % (ref 34.0–46.6)
Hemoglobin: 12.4 g/dL (ref 11.1–15.9)
Immature Grans (Abs): 0 10*3/uL (ref 0.0–0.1)
Immature Granulocytes: 0 %
Lymphocytes Absolute: 2 10*3/uL (ref 0.7–3.1)
Lymphs: 24 %
MCH: 27.4 pg (ref 26.6–33.0)
MCHC: 32.3 g/dL (ref 31.5–35.7)
MCV: 85 fL (ref 79–97)
Monocytes Absolute: 0.6 10*3/uL (ref 0.1–0.9)
Monocytes: 7 %
Neutrophils Absolute: 5.7 10*3/uL (ref 1.4–7.0)
Neutrophils: 67 %
Platelets: 398 10*3/uL (ref 150–450)
RBC: 4.52 x10E6/uL (ref 3.77–5.28)
RDW: 14.2 % (ref 11.7–15.4)
WBC: 8.4 10*3/uL (ref 3.4–10.8)

## 2023-02-14 LAB — POCT GLYCOSYLATED HEMOGLOBIN (HGB A1C): Hemoglobin A1C: 6.4 % — AB (ref 4.0–5.6)

## 2023-02-14 LAB — LIPID PANEL WITH LDL/HDL RATIO
Cholesterol, Total: 299 mg/dL — ABNORMAL HIGH (ref 100–199)
HDL: 47 mg/dL (ref >39)
LDL Chol Calc (NIH): 233 mg/dL — ABNORMAL HIGH (ref 0–99)
LDL/HDL Ratio: 5 ratio — ABNORMAL HIGH (ref 0.0–3.2)
Triglycerides: 109 mg/dL (ref 0–149)
VLDL Cholesterol Cal: 19 mg/dL (ref 5–40)

## 2023-02-14 LAB — TSH+FREE T4
Free T4: 1.12 ng/dL (ref 0.82–1.77)
TSH: 0.984 u[IU]/mL (ref 0.450–4.500)

## 2023-02-14 LAB — IRON,TIBC AND FERRITIN PANEL
Ferritin: 124 ng/mL (ref 15–150)
Iron Saturation: 24 % (ref 15–55)
Iron: 89 ug/dL (ref 27–159)
Total Iron Binding Capacity: 374 ug/dL (ref 250–450)
UIBC: 285 ug/dL (ref 131–425)

## 2023-02-14 LAB — COMPREHENSIVE METABOLIC PANEL
ALT: 19 IU/L (ref 0–32)
AST: 19 IU/L (ref 0–40)
Albumin/Globulin Ratio: 1.5 (ref 1.2–2.2)
Albumin: 4.4 g/dL (ref 3.8–4.9)
Alkaline Phosphatase: 128 IU/L — ABNORMAL HIGH (ref 44–121)
BUN/Creatinine Ratio: 22 (ref 9–23)
BUN: 19 mg/dL (ref 6–24)
Bilirubin Total: 0.4 mg/dL (ref 0.0–1.2)
CO2: 24 mmol/L (ref 20–29)
Calcium: 9.9 mg/dL (ref 8.7–10.2)
Chloride: 101 mmol/L (ref 96–106)
Creatinine, Ser: 0.88 mg/dL (ref 0.57–1.00)
Globulin, Total: 3 g/dL (ref 1.5–4.5)
Glucose: 133 mg/dL — ABNORMAL HIGH (ref 70–99)
Potassium: 4.3 mmol/L (ref 3.5–5.2)
Sodium: 139 mmol/L (ref 134–144)
Total Protein: 7.4 g/dL (ref 6.0–8.5)
eGFR: 79 mL/min/{1.73_m2} (ref >59)

## 2023-02-14 LAB — B12 AND FOLATE PANEL
Folate: 7 ng/mL (ref >3.0)
Vitamin B-12: 595 pg/mL (ref 232–1245)

## 2023-02-14 LAB — VITAMIN D 25 HYDROXY (VIT D DEFICIENCY, FRACTURES): Vit D, 25-Hydroxy: 27.9 ng/mL — ABNORMAL LOW (ref 30.0–100.0)

## 2023-02-14 LAB — SEDIMENTATION RATE: Sed Rate: 20 mm/hr (ref 0–40)

## 2023-02-14 MED ORDER — TRAZODONE HCL 50 MG PO TABS
50.0000 mg | ORAL_TABLET | Freq: Every day | ORAL | 2 refills | Status: AC
Start: 2023-02-14 — End: ?

## 2023-02-14 MED ORDER — METFORMIN HCL 500 MG PO TABS
500.0000 mg | ORAL_TABLET | Freq: Every day | ORAL | 1 refills | Status: DC
Start: 2023-02-14 — End: 2023-04-28

## 2023-02-14 NOTE — Progress Notes (Signed)
[facility] [address] [place], [place] [address]  Internal MEDICINE  Office Visit Note  Patient Name: Joanne Maddox  [id number]  [id number]  Date of Service: 02/14/2023  Chief Complaint  Patient presents with   Annual Exam   Gastroesophageal Reflux   Hypertension   Hyperlipidemia   Quality Metric Gaps    Shingles Vaccine     HPI Pt is here for routine health maintenance examination -Labs reviewed: Vit D low--will restart supplement, cholesterol very elevated, glucose elevated -Will also follow elevated alk phos, improved from last year but slightly elevated -Has not been taking lovastatin at all and will restart and recheck lipids, may need to switch to higher intensity statin if not improving. Will also start fish oil supplement and work on diet and exercise -Was having some pain under left breast a month ago, 2 episodes but then is went away and nothing else. Has not felt anything abnormal, not actually on breast itself, No other CP or SOB. 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 Is slightly tender on exam along lower left rib (reproducible), likely MSK related and will monitor -Mammogram UTD -Interested in wt loss and will call insurance to see if wt loss meds covered. Discussed alternative options and given elevated sugar and subsequent prediabetes diagnosis may be able to start GLP1 due to this. -Anxiety when she goes to lay down and will start 1/2 trazodone. Did used to take xanax as needed, but is open to trying trazodone instead for more daily need.  May increase zoloft in future once trazodone dose stable for a few weeks. Could increase to 50mg  zoloft by doubling current dose as trial but will avoid increasing at same time as adding trazodone for now. -Will get shingles vaccine at pharmacy  Current Medication: Outpatient Encounter Medications as of 02/14/2023  Medication Sig   ALPRAZolam (XANAX) 0.25 MG tablet Take  1 tablet (0.25 mg total) by mouth at bedtime as needed for anxiety.   celecoxib (CELEBREX) 200 MG capsule TAKE 1 CAPSULE BY MOUTH TWICE A DAY   cyclobenzaprine (FLEXERIL) 10 MG tablet Take 1 tablet (10 mg total) by mouth 3 (three) times daily as needed for muscle spasms.   fluticasone (FLONASE) 50 MCG/ACT nasal spray Place 2 sprays into both nostrils daily as needed for allergies.   ibuprofen (ADVIL) 600 MG tablet Take 1 tablet (600 mg total) by mouth every 6 (six) hours as needed for mild pain or cramping (pain).   lisinopril-hydrochlorothiazide (ZESTORETIC) 20-12.5 MG tablet TAKE 2 TABLETS BY MOUTH EVERY DAY   lovastatin (MEVACOR) 40 MG tablet TAKE 1 TABLET (40 MG TOTAL) BY MOUTH 4 (FOUR) TIMES A WEEK. AT NIGHT   metFORMIN (GLUCOPHAGE) 500 MG tablet Take 1 tablet (500 mg total) by mouth daily with breakfast.   omeprazole (PRILOSEC) 40 MG capsule Take 1 capsule (40 mg total) by mouth daily as needed (acid reflux). (Patient taking differently: Take 40 mg by mouth daily as needed (acid reflux). Pt take OTC)   sertraline (ZOLOFT) 25 MG tablet TAKE 1 TABLET (25 MG TOTAL) BY MOUTH DAILY.   SUMAtriptan (IMITREX) 50 MG tablet TAKE 1 TABLET (50 MG TOTAL) BY MOUTH EVERY 2 (TWO) HOURS AS NEEDED FOR MIGRAINE. MAY REPEAT IN 2 HOURS IF HEADACHE PERSISTS OR RECURS.   traZODone (DESYREL) 50 MG tablet Take 1 tablet (50 mg total) by mouth at bedtime.   No facility-administered encounter medications on file as of 02/14/2023.  Surgical History: Past Surgical History:  Procedure Laterality Date   ABDOMINAL HYSTERECTOMY     CESAREAN SECTION  09/24/1987   COLONOSCOPY  09/23/2008   Dr Mechele Collin   COLONOSCOPY WITH PROPOFOL N/A 02/16/2021   Procedure: COLONOSCOPY WITH PROPOFOL;  Surgeon: Pasty Spillers, MD;  Location: [facility] ENDOSCOPY;  Service: Endoscopy;  Laterality: N/A;   CYSTOSCOPY N/A 11/09/2020   Procedure: CYSTOSCOPY;  Surgeon: Vena Austria, MD;  Location: [facility] ORS;  Service: Gynecology;   Laterality: N/A;   KNEE SURGERY Right 09/23/1997   TOTAL LAPAROSCOPIC HYSTERECTOMY WITH SALPINGECTOMY Bilateral 11/09/2020   Procedure: TOTAL LAPAROSCOPIC HYSTERECTOMY WITH BILATERAL SALPINGECTOMY;  Surgeon: Vena Austria, MD;  Location: [facility] ORS;  Service: Gynecology;  Laterality: Bilateral;    Medical History: Past Medical History:  Diagnosis Date   Anxiety    Colon polyp 2010   GERD (gastroesophageal reflux disease)    Hypercholesteremia    Hyperlipidemia    Hypertension    Migraine     Family History: Family History  Problem Relation Age of Onset   Diabetes Mother    Hypertension Mother    Heart disease Mother    Hypertension Father    Heart disease Father    Breast cancer Neg Hx       Review of Systems  Constitutional:  Positive for fatigue. Negative for chills and unexpected weight change.  HENT:  Negative for congestion, postnasal drip, rhinorrhea, sneezing and sore throat.   Eyes:  Negative for redness.  Respiratory:  Negative for cough, chest tightness and shortness of breath.   Cardiovascular:  Negative for chest pain and palpitations.  Gastrointestinal:  Negative for abdominal pain, constipation, diarrhea, nausea and vomiting.  Genitourinary:  Negative for dysuria and frequency.  Musculoskeletal:  Positive for arthralgias. Negative for joint swelling and neck pain.  Skin:  Negative for rash.  Neurological: Negative.  Negative for tremors and numbness.  Hematological:  Negative for adenopathy. Does not bruise/bleed easily.  Psychiatric/Behavioral:  Positive for sleep disturbance. Negative for behavioral problems (Depression) and suicidal ideas. The patient is nervous/anxious.      Vital Signs: BP 115/80   Pulse 92   Temp 98.2 F (36.8 C)   Resp 16   Ht 4\' 9"  (1.448 m)   Wt 181 lb 9.6 oz (82.4 kg)   LMP 09/27/2020   SpO2 96%   BMI 39.30 kg/m    Physical Exam Vitals and nursing note reviewed.  Constitutional:      General: She is not in  acute distress.    Appearance: She is well-developed. She is obese. She is not diaphoretic.  HENT:     Head: Normocephalic and atraumatic.     Mouth/Throat:     Pharynx: No oropharyngeal exudate.  Eyes:     Pupils: Pupils are equal, round, and reactive to light.  Neck:     Thyroid: No thyromegaly.     Vascular: No JVD.     Trachea: No tracheal deviation.  Cardiovascular:     Rate and Rhythm: Normal rate and regular rhythm.     Heart sounds: Normal heart sounds. No murmur heard.    No friction rub. No gallop.  Pulmonary:     Effort: Pulmonary effort is normal. No respiratory distress.     Breath sounds: No wheezing or rales.     Comments: Mild tenderness overlying left lower rib with palpation Chest:     Chest wall: Tenderness present.  Breasts:    Right: Normal. No mass or tenderness.  Left: Normal. No mass or tenderness.  Abdominal:     General: Bowel sounds are normal.     Palpations: Abdomen is soft.     Tenderness: There is no abdominal tenderness.  Musculoskeletal:        General: Normal range of motion.     Cervical back: Normal range of motion and neck supple.     Right lower leg: No edema.     Left lower leg: No edema.  Lymphadenopathy:     Cervical: No cervical adenopathy.  Skin:    General: Skin is warm and dry.  Neurological:     Mental Status: She is alert and oriented to person, place, and time.     Cranial Nerves: No cranial nerve deficit.  Psychiatric:        Behavior: Behavior normal.        Thought Content: Thought content normal.        Judgment: Judgment normal.      LABS: Recent Results (from the past 2160 hour(s))  CBC w/Diff/Platelet     Status: None   Collection Time: 02/13/23  7:39 AM  Result Value Ref Range   WBC 8.4 3.4 - 10.8 x10E3/uL   RBC 4.52 3.77 - 5.28 x10E6/uL   Hemoglobin 12.4 11.1 - 15.9 g/dL   Hematocrit 16.1 09.6 - 46.6 %   MCV 85 79 - 97 fL   MCH 27.4 26.6 - 33.0 pg   MCHC 32.3 31.5 - 35.7 g/dL   RDW 04.5 40.9 -  81.1 %   Platelets 398 150 - 450 x10E3/uL   Neutrophils 67 Not Estab. %   Lymphs 24 Not Estab. %   Monocytes 7 Not Estab. %   Eos 1 Not Estab. %   Basos 1 Not Estab. %   Neutrophils Absolute 5.7 1.4 - 7.0 x10E3/uL   Lymphocytes Absolute 2.0 0.7 - 3.1 x10E3/uL   Monocytes Absolute 0.6 0.1 - 0.9 x10E3/uL   EOS (ABSOLUTE) 0.1 0.0 - 0.4 x10E3/uL   Basophils Absolute 0.0 0.0 - 0.2 x10E3/uL   Immature Granulocytes 0 Not Estab. %   Immature Grans (Abs) 0.0 0.0 - 0.1 x10E3/uL  Comprehensive metabolic panel     Status: Abnormal   Collection Time: 02/13/23  7:39 AM  Result Value Ref Range   Glucose 133 (H) 70 - 99 mg/dL   BUN 19 6 - 24 mg/dL   Creatinine, Ser 9.14 0.57 - 1.00 mg/dL   eGFR 79 >78 GN/FAO/1.30   BUN/Creatinine Ratio 22 9 - 23   Sodium 139 134 - 144 mmol/L   Potassium 4.3 3.5 - 5.2 mmol/L   Chloride 101 96 - 106 mmol/L   CO2 24 20 - 29 mmol/L   Calcium 9.9 8.7 - 10.2 mg/dL   Total Protein 7.4 6.0 - 8.5 g/dL   Albumin 4.4 3.8 - 4.9 g/dL   Globulin, Total 3.0 1.5 - 4.5 g/dL   Albumin/Globulin Ratio 1.5 1.2 - 2.2   Bilirubin Total 0.4 0.0 - 1.2 mg/dL   Alkaline Phosphatase 128 (H) 44 - 121 IU/L   AST 19 0 - 40 IU/L   ALT 19 0 - 32 IU/L  TSH + free T4     Status: None   Collection Time: 02/13/23  7:39 AM  Result Value Ref Range   TSH 0.984 0.450 - 4.500 uIU/mL   Free T4 1.12 0.82 - 1.77 ng/dL  Lipid Panel With LDL/HDL Ratio     Status: Abnormal   Collection Time: 02/13/23  7:39 AM  Result Value Ref Range   Cholesterol, Total 299 (H) 100 - 199 mg/dL   Triglycerides 161 0 - 149 mg/dL   HDL 47 >09 mg/dL   VLDL Cholesterol Cal 19 5 - 40 mg/dL   LDL Chol Calc (NIH) 604 (H) 0 - 99 mg/dL   Lipid Comment: Comment     Comment: Possible Familial Hypercholesterolemia. FH should be suspected when fasting LDL cholesterol is above 189 mg/dL or non-HDL cholesterol is above 219 mg/dL. A family history of high cholesterol and heart disease in 1st degree relatives should be  collected. J Clin Lipidol 2011;5:133-140    LDL/HDL Ratio 5.0 (H) 0.0 - 3.2 ratio    Comment:                                     LDL/HDL Ratio                                             Men  Women                               1/2 Avg.Risk  1.0    1.5                                   Avg.Risk  3.6    3.2                                2X Avg.Risk  6.2    5.0                                3X Avg.Risk  8.0    6.1   VITAMIN D 25 Hydroxy (Vit-D Deficiency, Fractures)     Status: Abnormal   Collection Time: 02/13/23  7:39 AM  Result Value Ref Range   Vit D, 25-Hydroxy 27.9 (L) 30.0 - 100.0 ng/mL    Comment: Vitamin D deficiency has been defined by the Institute of Medicine and an Endocrine Society practice guideline as a level of serum 25-OH vitamin D less than 20 ng/mL (1,2). The Endocrine Society went on to further define vitamin D insufficiency as a level between 21 and 29 ng/mL (2). 1. IOM (Institute of Medicine). 2010. Dietary reference    intakes for calcium and D. [place]: The    [employer]. 2. Holick MF, Binkley [place]me, Bischoff-Ferrari HA, et al.    Evaluation, treatment, and prevention of vitamin D    deficiency: an Endocrine Society clinical practice    guideline. JCEM. 2011 Jul; 96(7):1911-30.   B12 and Folate Panel     Status: None   Collection Time: 02/13/23  7:39 AM  Result Value Ref Range   Vitamin B-12 595 232 - 1,245 pg/mL   Folate 7.0 >3.0 ng/mL    Comment: A serum folate concentration of less than 3.1 ng/mL is considered to represent clinical deficiency.   Fe+TIBC+Fer     Status: None   Collection Time: 02/13/23  7:39 AM  Result Value Ref Range  Total Iron Binding Capacity 374 250 - 450 ug/dL   UIBC 865 784 - 696 ug/dL   Iron 89 27 - 295 ug/dL   Iron Saturation 24 15 - 55 %   Ferritin 124 15 - 150 ng/mL  Sed Rate (ESR)     Status: None   Collection Time: 02/13/23  7:39 AM  Result Value Ref Range   Sed Rate 20 0 - 40 mm/hr  POCT HgB  A1C     Status: Abnormal   Collection Time: 02/14/23 10:30 AM  Result Value Ref Range   Hemoglobin A1C 6.4 (A) 4.0 - 5.6 %   HbA1c POC (<> result, manual entry)     HbA1c, POC (prediabetic range)     HbA1c, POC (controlled diabetic range)          Assessment/Plan: 1. Encounter for general adult medical examination with abnormal findings CPE performed, labs reviewed, UTD on PHM except for shingles vaccines and will get this at pharmacy  2. Essential hypertension Stable, continue current medications  3. Generalized anxiety disorder May increase zoloft to 50mg  in a few weeks after trazodone trial  4. Abnormal fasting glucose - POCT HgB A1C is 6.4 in prediabetic range and will start metformin--cal if any problem, may consider GLP1 in future - metFORMIN (GLUCOPHAGE) 500 MG tablet; Take 1 tablet (500 mg total) by mouth daily with breakfast.  Dispense: 30 tablet; Refill: 1  5. Mixed hyperlipidemia Will restart statin and fish oil and recheck labs in a few months  6. Insomnia, unspecified type May try 1/2-1 tab nightly  - traZODone (DESYREL) 50 MG tablet; Take 1 tablet (50 mg total) by mouth at bedtime.  Dispense: 30 tablet; Refill: 2  7. Vitamin D deficiency Start vit D supplement  8. Visit for gynecologic examination Breast exam performed  9. Dysuria - UA/M w/rflx Culture, Routine   General Counseling: magdline maki understanding of the findings of todays visit and agrees with plan of treatment. I have discussed any further diagnostic evaluation that may be needed or ordered today. We also reviewed her medications today. she has been encouraged to call the office with any questions or concerns that should arise related to todays visit.    Counseling:    Orders Placed This Encounter  Procedures   UA/M w/rflx Culture, Routine   POCT HgB A1C    Meds ordered this encounter  Medications   metFORMIN (GLUCOPHAGE) 500 MG tablet    Sig: Take 1 tablet (500 mg total) by  mouth daily with breakfast.    Dispense:  30 tablet    Refill:  1   traZODone (DESYREL) 50 MG tablet    Sig: Take 1 tablet (50 mg total) by mouth at bedtime.    Dispense:  30 tablet    Refill:  2    This patient was seen by Lynn Ito, PA-C in collaboration with Dr. Beverely Risen as a part of collaborative care agreement.  Total time spent:40 Minutes  Time spent includes review of chart, medications, test results, and follow up plan with the patient.     Lyndon Code, MD  Internal Medicine

## 2023-02-15 LAB — UA/M W/RFLX CULTURE, ROUTINE
Bilirubin, UA: NEGATIVE
Ketones, UA: NEGATIVE

## 2023-02-18 LAB — URINE CULTURE, REFLEX

## 2023-02-18 LAB — UA/M W/RFLX CULTURE, ROUTINE: pH, UA: 6 (ref 5.0–7.5)

## 2023-02-18 LAB — MICROSCOPIC EXAMINATION

## 2023-02-19 ENCOUNTER — Other Ambulatory Visit: Payer: Self-pay

## 2023-02-19 ENCOUNTER — Telehealth: Payer: Self-pay

## 2023-02-19 LAB — URINE CULTURE, REFLEX

## 2023-02-19 LAB — MICROSCOPIC EXAMINATION: Casts: NONE SEEN /LPF

## 2023-02-19 LAB — UA/M W/RFLX CULTURE, ROUTINE
Glucose, UA: NEGATIVE
Nitrite, UA: NEGATIVE
Protein,UA: NEGATIVE
RBC, UA: NEGATIVE
Specific Gravity, UA: 1.006 (ref 1.005–1.030)
Urobilinogen, Ur: 0.2 mg/dL (ref 0.2–1.0)

## 2023-02-19 MED ORDER — NITROFURANTOIN MONOHYD MACRO 100 MG PO CAPS: 100.0000 mg | ORAL_CAPSULE | Freq: Two times a day (BID) | ORAL | 0 refills | Status: DC

## 2023-02-19 NOTE — Telephone Encounter (Signed)
Pt notified for labs and sent med

## 2023-02-19 NOTE — Telephone Encounter (Signed)
-----   Message from Carlean Jews, PA-C sent at 02/19/2023  4:10 PM EDT ----- Please let her know that her urine shows she has a UTI and send macrobid BID x10days

## 2023-02-24 ENCOUNTER — Telehealth: Payer: Self-pay

## 2023-02-24 NOTE — Telephone Encounter (Signed)
Pt called that she is having lower back pain right side as per lauren advised that finished antibiotic and take  ibuprofen  and drink plenty of water if not feeling better call us back

## 2023-02-26 ENCOUNTER — Telehealth: Payer: Self-pay

## 2023-02-26 ENCOUNTER — Other Ambulatory Visit: Payer: Self-pay | Admitting: Physician Assistant

## 2023-02-26 DIAGNOSIS — R109 Unspecified abdominal pain: Secondary | ICD-10-CM

## 2023-02-26 DIAGNOSIS — N3000 Acute cystitis without hematuria: Secondary | ICD-10-CM

## 2023-02-26 MED ORDER — CIPROFLOXACIN HCL 500 MG PO TABS
500.0000 mg | ORAL_TABLET | Freq: Two times a day (BID) | ORAL | 0 refills | Status: AC
Start: 2023-02-26 — End: 2023-03-03

## 2023-02-26 NOTE — Telephone Encounter (Signed)
Pt advised that stopped Macrobid and lauren sent Cipro if still having pain in her back need appt

## 2023-03-13 ENCOUNTER — Encounter: Payer: Self-pay | Admitting: Physician Assistant

## 2023-03-13 ENCOUNTER — Ambulatory Visit (INDEPENDENT_AMBULATORY_CARE_PROVIDER_SITE_OTHER): Payer: BC Managed Care – PPO | Admitting: Nurse Practitioner

## 2023-03-13 VITALS — BP 122/80 | HR 88 | Temp 98.2°F | Resp 16 | Ht <= 58 in | Wt 180.6 lb

## 2023-03-13 DIAGNOSIS — R635 Abnormal weight gain: Secondary | ICD-10-CM | POA: Diagnosis not present

## 2023-03-13 DIAGNOSIS — Z6839 Body mass index (BMI) 39.0-39.9, adult: Secondary | ICD-10-CM

## 2023-03-13 DIAGNOSIS — I10 Essential (primary) hypertension: Secondary | ICD-10-CM

## 2023-03-13 DIAGNOSIS — E1165 Type 2 diabetes mellitus with hyperglycemia: Secondary | ICD-10-CM | POA: Diagnosis not present

## 2023-03-13 MED ORDER — OZEMPIC (0.25 OR 0.5 MG/DOSE) 2 MG/3ML ~~LOC~~ SOPN
0.5000 mg | PEN_INJECTOR | SUBCUTANEOUS | 5 refills | Status: DC
Start: 2023-03-13 — End: 2023-04-10

## 2023-03-13 NOTE — Progress Notes (Signed)
[facility] [address] [place], [place] [address]  Internal MEDICINE  Office Visit Note  Patient Name: Joanne Maddox  [id number]  [id number]  Date of Service: 03/13/2023  Chief Complaint  Patient presents with   Follow-up   Gastroesophageal Reflux   Hypertension   Hyperlipidemia    HPI Joanne Maddox presents for a follow-up visit for diabetes, hypertension, and weight loss.  recently treated for UTI. No symptoms --started on metformin -- no side effects except a little looser bowels.  --started on trazodone which is helping with sleep.  --Wants to start with weight loss--states that her insurance told her that they do not cover mounjaro and wegovy.    Current Medication: Outpatient Encounter Medications as of 03/13/2023  Medication Sig   ALPRAZolam (XANAX) 0.25 MG tablet Take 1 tablet (0.25 mg total) by mouth at bedtime as needed for anxiety.   celecoxib (CELEBREX) 200 MG capsule TAKE 1 CAPSULE BY MOUTH TWICE A DAY   cyclobenzaprine (FLEXERIL) 10 MG tablet Take 1 tablet (10 mg total) by mouth 3 (three) times daily as needed for muscle spasms.   fluticasone (FLONASE) 50 MCG/ACT nasal spray Place 2 sprays into both nostrils daily as needed for allergies.   ibuprofen (ADVIL) 600 MG tablet Take 1 tablet (600 mg total) by mouth every 6 (six) hours as needed for mild pain or cramping (pain).   lisinopril-hydrochlorothiazide (ZESTORETIC) 20-12.5 MG tablet TAKE 2 TABLETS BY MOUTH EVERY DAY   lovastatin (MEVACOR) 40 MG tablet TAKE 1 TABLET (40 MG TOTAL) BY MOUTH 4 (FOUR) TIMES A WEEK. AT NIGHT   metFORMIN (GLUCOPHAGE) 500 MG tablet Take 1 tablet (500 mg total) by mouth daily with breakfast.   nitrofurantoin, macrocrystal-monohydrate, (MACROBID) 100 MG capsule Take 1 capsule (100 mg total) by mouth 2 (two) times daily.   omeprazole (PRILOSEC) 40 MG capsule Take 1 capsule (40 mg total) by mouth daily as needed (acid reflux). (Patient taking differently: Take 40 mg by mouth daily as  needed (acid reflux). Pt take OTC)   Semaglutide,0.25 or 0.5MG /DOS, (OZEMPIC, 0.25 OR 0.5 MG/DOSE,) 2 MG/3ML SOPN Inject 0.5 mg into the skin once a week.   sertraline (ZOLOFT) 25 MG tablet TAKE 1 TABLET (25 MG TOTAL) BY MOUTH DAILY.   SUMAtriptan (IMITREX) 50 MG tablet TAKE 1 TABLET (50 MG TOTAL) BY MOUTH EVERY 2 (TWO) HOURS AS NEEDED FOR MIGRAINE. MAY REPEAT IN 2 HOURS IF HEADACHE PERSISTS OR RECURS.   traZODone (DESYREL) 50 MG tablet Take 1 tablet (50 mg total) by mouth at bedtime.   No facility-administered encounter medications on file as of 03/13/2023.    Surgical History: Past Surgical History:  Procedure Laterality Date   ABDOMINAL HYSTERECTOMY     CESAREAN SECTION  09/24/1987   COLONOSCOPY  09/23/2008   Dr Mechele Collin   COLONOSCOPY WITH PROPOFOL N/A 02/16/2021   Procedure: COLONOSCOPY WITH PROPOFOL;  Surgeon: Pasty Spillers, MD;  Location: [facility];  Service: Endoscopy;  Laterality: N/A;   CYSTOSCOPY N/A 11/09/2020   Procedure: CYSTOSCOPY;  Surgeon: Vena Austria, MD;  Location: [facility];  Service: Gynecology;  Laterality: N/A;   KNEE SURGERY Right 09/23/1997   TOTAL LAPAROSCOPIC HYSTERECTOMY WITH SALPINGECTOMY Bilateral 11/09/2020   Procedure: TOTAL LAPAROSCOPIC HYSTERECTOMY WITH BILATERAL SALPINGECTOMY;  Surgeon: Vena Austria, MD;  Location: [facility] OR[facility];  Service: Gynecology;  Laterality: Bilateral;    Medical History: Past Medical History:  Diagnosis Date   Anxiety    Colon polyp 2010   GERD (gastroesophageal reflux disease)  Hypercholesteremia    Hyperlipidemia    Hypertension    Migraine     Family History: Family History  Problem Relation Age of Onset   Diabetes Mother    Hypertension Mother    Heart disease Mother    Hypertension Father    Heart disease Father    Breast cancer Neg Hx     Social History   Socioeconomic History   Marital status: Married    Spouse name: Not on file   Number of children: Not on file   Years of  education: Not on file   Highest education level: Not on file  Occupational History   Not on file  Tobacco Use   Smoking status: Never   Smokeless tobacco: Never  Vaping Use   Vaping Use: Never used  Substance and Sexual Activity   Alcohol use: Yes    Comment: occasionally   Drug use: No   Sexual activity: Yes    Birth control/protection: Surgical    Comment: Hysterectomy  Other Topics Concern   Not on file  Social History Narrative   Not on file   Social Determinants of Health   Financial Resource Strain: Not on file  Food Insecurity: Not on file  Transportation Needs: Not on file  Physical Activity: Not on file  Stress: Not on file  Social Connections: Not on file  Intimate Partner Violence: Not on file      Review of Systems  Constitutional:  Positive for appetite change and unexpected weight change.  HENT: Negative.    Respiratory: Negative.  Negative for cough, chest tightness, shortness of breath and wheezing.   Cardiovascular: Negative.  Negative for chest pain and palpitations.  Gastrointestinal: Negative.   Genitourinary: Negative.     Vital Signs: BP 122/80   Pulse 88   Temp 98.2 F (36.8 C)   Resp 16   Ht 4\' 9"  (1.448 m)   Wt 180 lb 9.6 oz (81.9 kg)   LMP 09/27/2020   SpO2 97%   BMI 39.08 kg/m    Physical Exam Vitals reviewed.  Constitutional:      General: She is not in acute distress.    Appearance: Normal appearance. She is obese. She is not ill-appearing.  HENT:     Head: Normocephalic and atraumatic.  Eyes:     Pupils: Pupils are equal, round, and reactive to light.  Cardiovascular:     Rate and Rhythm: Normal rate and regular rhythm.  Pulmonary:     Effort: No respiratory distress.  Neurological:     Mental Status: She is alert and oriented to person, place, and time.  Psychiatric:        Mood and Affect: Mood normal.        Behavior: Behavior normal.        Assessment/Plan: 1. Type 2 diabetes mellitus with  hyperglycemia, without long-term current use of insulin (HCC) Continue metformin as prescribed. Start ozempic, sample given to patient. Ozempic prescription sent to pharmacy, will probably need prior authorization - Semaglutide,0.25 or 0.5MG /DOS, (OZEMPIC, 0.25 OR 0.5 MG/DOSE,) 2 MG/3ML SOPN; Inject 0.5 mg into the skin once a week.  Dispense: 3 mL; Refill: 5  2. Essential hypertension Stable, continue medications as prescribed.   3. Abnormal weight gain Start ozempic sample, follow up in 4 weeks - Semaglutide,0.25 or 0.5MG /DOS, (OZEMPIC, 0.25 OR 0.5 MG/DOSE,) 2 MG/3ML SOPN; Inject 0.5 mg into the skin once a week.  Dispense: 3 mL; Refill: 5  4. Class 2 severe  obesity due to excess calories with serious comorbidity and body mass index (BMI) of 39.0 to 39.9 in adult [facility]) Start ozempic sample, follow up in 4 weeks  - Semaglutide,0.25 or 0.5MG /DOS, (OZEMPIC, 0.25 OR 0.5 MG/DOSE,) 2 MG/3ML SOPN; Inject 0.5 mg into the skin once a week.  Dispense: 3 mL; Refill: 5   General Counseling: Joanne Maddox verbalizes understanding of the findings of todays visit and agrees with plan of treatment. I have discussed any further diagnostic evaluation that may be needed or ordered today. We also reviewed her medications today. she has been encouraged to call the office with any questions or concerns that should arise related to todays visit.    No orders of the defined types were placed in this encounter.   Meds ordered this encounter  Medications   Semaglutide,0.25 or 0.5MG /DOS, (OZEMPIC, 0.25 OR 0.5 MG/DOSE,) 2 MG/3ML SOPN    Sig: Inject 0.5 mg into the skin once a week.    Dispense:  3 mL    Refill:  5    Dx code E11.65, new script. Currently also on metformin. Please send prior authorization if required to fax # [phone]    Return in about 4 weeks (around 04/10/2023) for F/U, eval new med started ozempic sample, with lauren PCP.   Total time spent:30 Minutes Time spent includes review of chart,  medications, test results, and follow up plan with the patient.   [place] Controlled Substance Database was reviewed by me.  This patient was seen by Sallyanne Kuster, FNP-C in collaboration with Dr. Beverely Risen as a part of collaborative care agreement.   Gloyd Happ R. Tedd Sias, MSN, FNP-C Internal medicine

## 2023-03-14 ENCOUNTER — Encounter: Payer: Self-pay | Admitting: Nurse Practitioner

## 2023-04-10 ENCOUNTER — Encounter: Payer: Self-pay | Admitting: Physician Assistant

## 2023-04-10 ENCOUNTER — Ambulatory Visit (INDEPENDENT_AMBULATORY_CARE_PROVIDER_SITE_OTHER): Payer: BC Managed Care – PPO | Admitting: Physician Assistant

## 2023-04-10 VITALS — BP 125/82 | HR 102 | Temp 98.5°F | Resp 16 | Ht <= 58 in | Wt 176.0 lb

## 2023-04-10 DIAGNOSIS — R635 Abnormal weight gain: Secondary | ICD-10-CM | POA: Diagnosis not present

## 2023-04-10 DIAGNOSIS — G4700 Insomnia, unspecified: Secondary | ICD-10-CM | POA: Diagnosis not present

## 2023-04-10 DIAGNOSIS — E1165 Type 2 diabetes mellitus with hyperglycemia: Secondary | ICD-10-CM | POA: Diagnosis not present

## 2023-04-10 DIAGNOSIS — E669 Obesity, unspecified: Secondary | ICD-10-CM | POA: Diagnosis not present

## 2023-04-10 MED ORDER — RYBELSUS 7 MG PO TABS
7.0000 mg | ORAL_TABLET | Freq: Every day | ORAL | 2 refills | Status: DC
Start: 2023-04-10 — End: 2023-07-10

## 2023-04-10 MED ORDER — ACCU-CHEK GUIDE ME W/DEVICE KIT
PACK | 0 refills | Status: AC
Start: 2023-04-10 — End: ?

## 2023-04-10 NOTE — Progress Notes (Signed)
[facility] [address] [place], [place] [address]  Internal MEDICINE  Office Visit Note  Patient Name: Joanne Maddox  [id number]  [id number]  Date of Service: 04/16/2023  Chief Complaint  Patient presents with   Follow-up   Gastroesophageal Reflux   Hypertension   Hyperlipidemia   Quality Metric Gaps    Shingles Vaccines    HPI Pt is here for routine follow up -down 4 lbs since last visit -trazodone 50mg  at bedtime had been working well, but recently has been feeling more tired the next day. Thinks she is taking later now due to 2 jobs and getting home later. Will try taking 1/2 tablet -doesn't think ozempic is covered as pharmacy told her to contact her provider's office and said nothing about a PA. It was tolerated ok. Would be open to trying rybelsus if covered instead and will give sample and send script for next dose -is taking metformin, tolerating ok for the most part -Occasionally lightheaded and may be heat/dehydration and will work on this. Will monitor BP and start monitoring BG as well -right knee pain, hx of ACL repair. Was told prev she may need knee replacment. Just started hurting last week and may have popped a little but is walking on it fine now.  Will try topical like voltaren and follow up with ortho if needed  Current Medication: Outpatient Encounter Medications as of 04/10/2023  Medication Sig   ALPRAZolam (XANAX) 0.25 MG tablet Take 1 tablet (0.25 mg total) by mouth at bedtime as needed for anxiety.   Blood Glucose Monitoring Suppl (ACCU-CHEK GUIDE ME) w/Device KIT Use as directed. E11.65   celecoxib (CELEBREX) 200 MG capsule TAKE 1 CAPSULE BY MOUTH TWICE A DAY   cyclobenzaprine (FLEXERIL) 10 MG tablet Take 1 tablet (10 mg total) by mouth 3 (three) times daily as needed for muscle spasms.   fluticasone (FLONASE) 50 MCG/ACT nasal spray Place 2 sprays into both nostrils daily as needed for allergies.   ibuprofen (ADVIL) 600 MG tablet Take 1  tablet (600 mg total) by mouth every 6 (six) hours as needed for mild pain or cramping (pain).   lisinopril-hydrochlorothiazide (ZESTORETIC) 20-12.5 MG tablet TAKE 2 TABLETS BY MOUTH EVERY DAY   lovastatin (MEVACOR) 40 MG tablet TAKE 1 TABLET (40 MG TOTAL) BY MOUTH 4 (FOUR) TIMES A WEEK. AT NIGHT   metFORMIN (GLUCOPHAGE) 500 MG tablet Take 1 tablet (500 mg total) by mouth daily with breakfast.   nitrofurantoin, macrocrystal-monohydrate, (MACROBID) 100 MG capsule Take 1 capsule (100 mg total) by mouth 2 (two) times daily.   omeprazole (PRILOSEC) 40 MG capsule Take 1 capsule (40 mg total) by mouth daily as needed (acid reflux). (Patient taking differently: Take 40 mg by mouth daily as needed (acid reflux). Pt take OTC)   Semaglutide (RYBELSUS) 7 MG TABS Take 1 tablet (7 mg total) by mouth daily.   sertraline (ZOLOFT) 25 MG tablet TAKE 1 TABLET (25 MG TOTAL) BY MOUTH DAILY.   SUMAtriptan (IMITREX) 50 MG tablet TAKE 1 TABLET (50 MG TOTAL) BY MOUTH EVERY 2 (TWO) HOURS AS NEEDED FOR MIGRAINE. MAY REPEAT IN 2 HOURS IF HEADACHE PERSISTS OR RECURS.   traZODone (DESYREL) 50 MG tablet Take 1 tablet (50 mg total) by mouth at bedtime.   [DISCONTINUED] Semaglutide,0.25 or 0.5MG /DOS, (OZEMPIC, 0.25 OR 0.5 MG/DOSE,) 2 MG/3ML SOPN Inject 0.5 mg into the skin once a week.   No facility-administered encounter medications on file as of 04/10/2023.    Surgical History: Past  Surgical History:  Procedure Laterality Date   ABDOMINAL HYSTERECTOMY     CESAREAN SECTION  09/24/1987   COLONOSCOPY  09/23/2008   Dr Mechele Collin   COLONOSCOPY WITH PROPOFOL N/A 02/16/2021   Procedure: COLONOSCOPY WITH PROPOFOL;  Surgeon: Pasty Spillers, MD;  Location: [facility];  Service: Endoscopy;  Laterality: N/A;   CYSTOSCOPY N/A 11/09/2020   Procedure: CYSTOSCOPY;  Surgeon: Vena Austria, MD;  Location: [facility];  Service: Gynecology;  Laterality: N/A;   KNEE SURGERY Right 09/23/1997   TOTAL LAPAROSCOPIC HYSTERECTOMY WITH  SALPINGECTOMY Bilateral 11/09/2020   Procedure: TOTAL LAPAROSCOPIC HYSTERECTOMY WITH BILATERAL SALPINGECTOMY;  Surgeon: Vena Austria, MD;  Location: [facility];  Service: Gynecology;  Laterality: Bilateral;    Medical History: Past Medical History:  Diagnosis Date   Anxiety    Colon polyp 2010   GERD (gastroesophageal reflux disease)    Hypercholesteremia    Hyperlipidemia    Hypertension    Migraine     Family History: Family History  Problem Relation Age of Onset   Diabetes Mother    Hypertension Mother    Heart disease Mother    Hypertension Father    Heart disease Father    Breast cancer Neg Hx     Social History   Socioeconomic History   Marital status: Married    Spouse name: Not on file   Number of children: Not on file   Years of education: Not on file   Highest education level: Not on file  Occupational History   Not on file  Tobacco Use   Smoking status: Never   Smokeless tobacco: Never  Vaping Use   Vaping status: Never Used  Substance and Sexual Activity   Alcohol use: Yes    Comment: occasionally   Drug use: No   Sexual activity: Yes    Birth control/protection: Surgical    Comment: Hysterectomy  Other Topics Concern   Not on file  Social History Narrative   Not on file   Social Determinants of Health   Financial Resource Strain: Not on file  Food Insecurity: Not on file  Transportation Needs: Not on file  Physical Activity: Not on file  Stress: Not on file  Social Connections: Not on file  Intimate Partner Violence: Not on file      Review of Systems  Constitutional:  Positive for fatigue. Negative for chills and unexpected weight change.  HENT:  Negative for congestion, postnasal drip, rhinorrhea, sneezing and sore throat.   Eyes:  Negative for redness.  Respiratory:  Negative for cough, chest tightness and shortness of breath.   Cardiovascular:  Negative for chest pain and palpitations.  Gastrointestinal:  Negative for  abdominal pain, constipation, diarrhea, nausea and vomiting.  Genitourinary:  Negative for dysuria and frequency.  Musculoskeletal:  Positive for arthralgias. Negative for joint swelling and neck pain.  Skin:  Negative for rash.  Neurological:  Positive for light-headedness. Negative for tremors and numbness.  Hematological:  Negative for adenopathy. Does not bruise/bleed easily.  Psychiatric/Behavioral:  Positive for sleep disturbance. Negative for behavioral problems (Depression) and suicidal ideas. The patient is nervous/anxious.     Vital Signs: BP 125/82   Pulse (!) 102   Temp 98.5 F (36.9 C)   Resp 16   Ht 4\' 9"  (1.448 m)   Wt 176 lb (79.8 kg)   LMP 09/27/2020   SpO2 98%   BMI 38.09 kg/m    Physical Exam Vitals and nursing note reviewed.  Constitutional:  General: She is not in acute distress.    Appearance: She is well-developed. She is obese. She is not diaphoretic.  HENT:     Head: Normocephalic and atraumatic.     Mouth/Throat:     Pharynx: No oropharyngeal exudate.  Eyes:     Pupils: Pupils are equal, round, and reactive to light.  Neck:     Thyroid: No thyromegaly.     Vascular: No JVD.     Trachea: No tracheal deviation.  Cardiovascular:     Rate and Rhythm: Normal rate and regular rhythm.     Heart sounds: Normal heart sounds. No murmur heard.    No friction rub. No gallop.  Pulmonary:     Effort: Pulmonary effort is normal. No respiratory distress.     Breath sounds: No wheezing or rales.  Chest:     Chest wall: No tenderness.  Abdominal:     General: Bowel sounds are normal.     Palpations: Abdomen is soft.  Musculoskeletal:        General: Normal range of motion.     Cervical back: Normal range of motion and neck supple.  Lymphadenopathy:     Cervical: No cervical adenopathy.  Skin:    General: Skin is warm and dry.  Neurological:     Mental Status: She is alert and oriented to person, place, and time.     Cranial Nerves: No cranial  nerve deficit.  Psychiatric:        Behavior: Behavior normal.        Thought Content: Thought content normal.        Judgment: Judgment normal.        Assessment/Plan: 1. Insomnia, unspecified type Will try cutting trazodone in half to avoid next day grogginess now that she is taking later in evening.  2. Abnormal weight gain Given sample for 3mg  and will send script for next dose to start in 4 weeks. - Semaglutide (RYBELSUS) 7 MG TABS; Take 1 tablet (7 mg total) by mouth daily.  Dispense: 30 tablet; Refill: 2  3. Type 2 diabetes mellitus with hyperglycemia, without long-term current use of insulin (HCC) Will start monitoring BG at home and eating at regular intervals. Will start rybelsus - Semaglutide (RYBELSUS) 7 MG TABS; Take 1 tablet (7 mg total) by mouth daily.  Dispense: 30 tablet; Refill: 2 - Blood Glucose Monitoring Suppl (ACCU-CHEK GUIDE ME) w/Device KIT; Use as directed. E11.65  Dispense: 1 kit; Refill: 0  4. Obesity (BMI 30-39.9) - Semaglutide (RYBELSUS) 7 MG TABS; Take 1 tablet (7 mg total) by mouth daily.  Dispense: 30 tablet; Refill: 2   General Counseling: Joanne Maddox verbalizes understanding of the findings of todays visit and agrees with plan of treatment. I have discussed any further diagnostic evaluation that may be needed or ordered today. We also reviewed her medications today. she has been encouraged to call the office with any questions or concerns that should arise related to todays visit.    No orders of the defined types were placed in this encounter.   Meds ordered this encounter  Medications   Semaglutide (RYBELSUS) 7 MG TABS    Sig: Take 1 tablet (7 mg total) by mouth daily.    Dispense:  30 tablet    Refill:  2   Blood Glucose Monitoring Suppl (ACCU-CHEK GUIDE ME) w/Device KIT    Sig: Use as directed. E11.65    Dispense:  1 kit    Refill:  0    This  patient was seen by Lynn Ito, PA-C in collaboration with Dr. Beverely Risen as a part of  collaborative care agreement.   Total time spent:35 Minutes Time spent includes review of chart, medications, test results, and follow up plan with the patient.      Dr Lyndon Code Internal medicine

## 2023-04-26 ENCOUNTER — Other Ambulatory Visit: Payer: Self-pay | Admitting: Physician Assistant

## 2023-04-26 DIAGNOSIS — R7301 Impaired fasting glucose: Secondary | ICD-10-CM

## 2023-05-12 ENCOUNTER — Encounter: Payer: Self-pay | Admitting: Physician Assistant

## 2023-05-12 ENCOUNTER — Ambulatory Visit (INDEPENDENT_AMBULATORY_CARE_PROVIDER_SITE_OTHER): Payer: BC Managed Care – PPO | Admitting: Physician Assistant

## 2023-05-12 VITALS — BP 115/70 | HR 94 | Temp 98.4°F | Resp 16 | Ht <= 58 in | Wt 180.0 lb

## 2023-05-12 DIAGNOSIS — E669 Obesity, unspecified: Secondary | ICD-10-CM

## 2023-05-12 DIAGNOSIS — G4700 Insomnia, unspecified: Secondary | ICD-10-CM | POA: Diagnosis not present

## 2023-05-12 DIAGNOSIS — E1165 Type 2 diabetes mellitus with hyperglycemia: Secondary | ICD-10-CM

## 2023-05-12 NOTE — Progress Notes (Signed)
[facility] [address] [place], [place] [address]  Internal MEDICINE  Office Visit Note  Patient Name: Joanne Maddox  [id number]  [id number]  Date of Service: 05/21/2023  Chief Complaint  Patient presents with   Follow-up   Gastroesophageal Reflux   Hypertension   Hyperlipidemia    HPI Pt is here for routine follow up -stopped the metformin because she was feeling tired and wasn't sure if this was the cause -had started rybelsus sample as well and wasn't sure which it was. Never got the script, pharmacy said something else needed for this. Advised we have not received anything about this and may need to follow up with pharmacy again. Will check for any PA, but none in system currently. -back to taking 1/2 trazodone and does better  Current Medication: Outpatient Encounter Medications as of 05/12/2023  Medication Sig   ALPRAZolam (XANAX) 0.25 MG tablet Take 1 tablet (0.25 mg total) by mouth at bedtime as needed for anxiety.   Blood Glucose Monitoring Suppl (ACCU-CHEK GUIDE ME) w/Device KIT Use as directed. E11.65   celecoxib (CELEBREX) 200 MG capsule TAKE 1 CAPSULE BY MOUTH TWICE A DAY   cyclobenzaprine (FLEXERIL) 10 MG tablet Take 1 tablet (10 mg total) by mouth 3 (three) times daily as needed for muscle spasms.   fluticasone (FLONASE) 50 MCG/ACT nasal spray Place 2 sprays into both nostrils daily as needed for allergies.   ibuprofen (ADVIL) 600 MG tablet Take 1 tablet (600 mg total) by mouth every 6 (six) hours as needed for mild pain or cramping (pain).   lisinopril-hydrochlorothiazide (ZESTORETIC) 20-12.5 MG tablet TAKE 2 TABLETS BY MOUTH EVERY DAY   lovastatin (MEVACOR) 40 MG tablet TAKE 1 TABLET (40 MG TOTAL) BY MOUTH 4 (FOUR) TIMES A WEEK. AT NIGHT   metFORMIN (GLUCOPHAGE) 500 MG tablet TAKE 1 TABLET BY MOUTH EVERY DAY WITH BREAKFAST   omeprazole (PRILOSEC) 40 MG capsule Take 1 capsule (40 mg total) by mouth daily as needed (acid reflux). (Patient taking  differently: Take 40 mg by mouth daily as needed (acid reflux). Pt take OTC)   Semaglutide (RYBELSUS) 7 MG TABS Take 1 tablet (7 mg total) by mouth daily.   sertraline (ZOLOFT) 25 MG tablet TAKE 1 TABLET (25 MG TOTAL) BY MOUTH DAILY.   SUMAtriptan (IMITREX) 50 MG tablet TAKE 1 TABLET (50 MG TOTAL) BY MOUTH EVERY 2 (TWO) HOURS AS NEEDED FOR MIGRAINE. MAY REPEAT IN 2 HOURS IF HEADACHE PERSISTS OR RECURS.   traZODone (DESYREL) 50 MG tablet Take 1 tablet (50 mg total) by mouth at bedtime.   [DISCONTINUED] nitrofurantoin, macrocrystal-monohydrate, (MACROBID) 100 MG capsule Take 1 capsule (100 mg total) by mouth 2 (two) times daily.   No facility-administered encounter medications on file as of 05/12/2023.    Surgical History: Past Surgical History:  Procedure Laterality Date   ABDOMINAL HYSTERECTOMY     CESAREAN SECTION  09/24/1987   COLONOSCOPY  09/23/2008   Dr Mechele Collin   COLONOSCOPY WITH PROPOFOL N/A 02/16/2021   Procedure: COLONOSCOPY WITH PROPOFOL;  Surgeon: Pasty Spillers, MD;  Location: [facility];  Service: Endoscopy;  Laterality: N/A;   CYSTOSCOPY N/A 11/09/2020   Procedure: CYSTOSCOPY;  Surgeon: Vena Austria, MD;  Location: [facility];  Service: Gynecology;  Laterality: N/A;   KNEE SURGERY Right 09/23/1997   TOTAL LAPAROSCOPIC HYSTERECTOMY WITH SALPINGECTOMY Bilateral 11/09/2020   Procedure: TOTAL LAPAROSCOPIC HYSTERECTOMY WITH BILATERAL SALPINGECTOMY;  Surgeon: Vena Austria, MD;  Location: [facility];  Service: Gynecology;  Laterality: Bilateral;  Medical History: Past Medical History:  Diagnosis Date   Anxiety    Colon polyp 2010   GERD (gastroesophageal reflux disease)    Hypercholesteremia    Hyperlipidemia    Hypertension    Migraine     Family History: Family History  Problem Relation Age of Onset   Diabetes Mother    Hypertension Mother    Heart disease Mother    Hypertension Father    Heart disease Father    Breast cancer Neg Hx     Social  History   Socioeconomic History   Marital status: Married    Spouse name: Not on file   Number of children: Not on file   Years of education: Not on file   Highest education level: Not on file  Occupational History   Not on file  Tobacco Use   Smoking status: Never   Smokeless tobacco: Never  Vaping Use   Vaping status: Never Used  Substance and Sexual Activity   Alcohol use: Yes    Comment: occasionally   Drug use: No   Sexual activity: Yes    Birth control/protection: Surgical    Comment: Hysterectomy  Other Topics Concern   Not on file  Social History Narrative   Not on file   Social Determinants of Health   Financial Resource Strain: Not on file  Food Insecurity: Not on file  Transportation Needs: Not on file  Physical Activity: Not on file  Stress: Not on file  Social Connections: Not on file  Intimate Partner Violence: Not on file      Review of Systems  Constitutional:  Positive for fatigue. Negative for chills and unexpected weight change.  HENT:  Positive for postnasal drip. Negative for congestion, rhinorrhea, sneezing and sore throat.   Eyes:  Negative for redness.  Respiratory:  Negative for cough, chest tightness and shortness of breath.   Cardiovascular:  Negative for chest pain and palpitations.  Gastrointestinal:  Negative for abdominal pain, constipation, diarrhea, nausea and vomiting.  Genitourinary:  Negative for dysuria and frequency.  Musculoskeletal:  Negative for arthralgias, back pain, joint swelling and neck pain.  Skin:  Negative for rash.  Neurological: Negative.  Negative for tremors and numbness.  Hematological:  Negative for adenopathy. Does not bruise/bleed easily.  Psychiatric/Behavioral:  Positive for sleep disturbance. Negative for behavioral problems (Depression) and suicidal ideas. The patient is not nervous/anxious.     Vital Signs: BP 115/70   Pulse 94   Temp 98.4 F (36.9 C)   Resp 16   Ht 4\' 9"  (1.448 m)   Wt 180 lb  (81.6 kg)   LMP 09/27/2020   SpO2 97%   BMI 38.95 kg/m    Physical Exam Vitals and nursing note reviewed.  Constitutional:      General: She is not in acute distress.    Appearance: She is well-developed. She is obese. She is not diaphoretic.  HENT:     Head: Normocephalic and atraumatic.     Mouth/Throat:     Pharynx: No oropharyngeal exudate.  Eyes:     Pupils: Pupils are equal, round, and reactive to light.  Neck:     Thyroid: No thyromegaly.     Vascular: No JVD.     Trachea: No tracheal deviation.  Cardiovascular:     Rate and Rhythm: Normal rate and regular rhythm.     Heart sounds: Normal heart sounds. No murmur heard.    No friction rub. No gallop.  Pulmonary:  Effort: Pulmonary effort is normal. No respiratory distress.     Breath sounds: No wheezing or rales.  Chest:     Chest wall: No tenderness.  Abdominal:     General: Bowel sounds are normal.     Palpations: Abdomen is soft.  Musculoskeletal:        General: Normal range of motion.     Cervical back: Normal range of motion and neck supple.  Lymphadenopathy:     Cervical: No cervical adenopathy.  Skin:    General: Skin is warm and dry.  Neurological:     Mental Status: She is alert and oriented to person, place, and time.     Cranial Nerves: No cranial nerve deficit.  Psychiatric:        Behavior: Behavior normal.        Thought Content: Thought content normal.        Judgment: Judgment normal.        Assessment/Plan: 1. Type 2 diabetes mellitus with hyperglycemia, without long-term current use of insulin (HCC) Stopped metformin due to possible S/E, will follow up on rybelsus vs ozempic with pharmacy, may need to check with insurance as well.  2. Insomnia, unspecified type Improved on 1/2 tab trazodone  3. Obesity (BMI 30-39.9) Will work on diet/exercise and will see about GLP1 approval/options    General Counseling: librada bott understanding of the findings of todays visit and  agrees with plan of treatment. I have discussed any further diagnostic evaluation that may be needed or ordered today. We also reviewed her medications today. she has been encouraged to call the office with any questions or concerns that should arise related to todays visit.    No orders of the defined types were placed in this encounter.   No orders of the defined types were placed in this encounter.   This patient was seen by Lynn Ito, PA-C in collaboration with Dr. Beverely Risen as a part of collaborative care agreement.   Total time spent:30 Minutes Time spent includes review of chart, medications, test results, and follow up plan with the patient.      Dr Lyndon Code Internal medicine

## 2023-05-16 ENCOUNTER — Telehealth (INDEPENDENT_AMBULATORY_CARE_PROVIDER_SITE_OTHER): Payer: BC Managed Care – PPO | Admitting: Physician Assistant

## 2023-05-16 ENCOUNTER — Encounter: Payer: Self-pay | Admitting: Physician Assistant

## 2023-05-16 VITALS — Ht <= 58 in | Wt 180.0 lb

## 2023-05-16 DIAGNOSIS — U071 COVID-19: Secondary | ICD-10-CM | POA: Diagnosis not present

## 2023-05-16 MED ORDER — PREDNISONE 10 MG PO TABS
ORAL_TABLET | ORAL | 0 refills | Status: AC
Start: 2023-05-16 — End: ?

## 2023-05-16 MED ORDER — AZITHROMYCIN 250 MG PO TABS
ORAL_TABLET | ORAL | 0 refills | Status: DC
Start: 2023-05-16 — End: 2024-01-23

## 2023-05-16 NOTE — Progress Notes (Signed)
[facility] [address] [place], [place] [address]  Internal MEDICINE  Telephone Visit  Patient Name: Joanne Maddox  [id number]  [id number]  Date of Service: 05/16/2023  I connected with the patient at 11:44 by telephone and verified the patients identity using two identifiers.   I discussed the limitations, risks, security and privacy concerns of performing an evaluation and management service by telephone and the availability of in person appointments. I also discussed with the patient that there may be a patient responsible charge related to the service.  The patient expressed understanding and agrees to proceed.    Chief Complaint  Patient presents with   Telephone Assessment    Start Monday    Telephone Screen    Worse on  Wednesday ,covid positive    Cough   Sinusitis    HPI Pt is here for virtual sick visit -Symptoms started Monday night, got worse on Wednesday, covid neg on Wednesday, but then was positive on Thursday morning -Chills, cough, congestion, body aches, a little nausea, no headache. Feeling more fatigued -taking tylenol sinus vs motrin-tylenol dual action, nyquil. Advised to be cautious combining medications containing tylenol so she isnt taking too much. May use flonase or try mucinex as well -first time having covid  Current Medication: Outpatient Encounter Medications as of 05/16/2023  Medication Sig   ALPRAZolam (XANAX) 0.25 MG tablet Take 1 tablet (0.25 mg total) by mouth at bedtime as needed for anxiety.   azithromycin (ZITHROMAX) 250 MG tablet Take one tab a day for 10 days for uri   Blood Glucose Monitoring Suppl (ACCU-CHEK GUIDE ME) w/Device KIT Use as directed. E11.65   celecoxib (CELEBREX) 200 MG capsule TAKE 1 CAPSULE BY MOUTH TWICE A DAY   cyclobenzaprine (FLEXERIL) 10 MG tablet Take 1 tablet (10 mg total) by mouth 3 (three) times daily as needed for muscle spasms.   fluticasone (FLONASE) 50 MCG/ACT nasal spray Place 2 sprays into  both nostrils daily as needed for allergies.   ibuprofen (ADVIL) 600 MG tablet Take 1 tablet (600 mg total) by mouth every 6 (six) hours as needed for mild pain or cramping (pain).   lisinopril-hydrochlorothiazide (ZESTORETIC) 20-12.5 MG tablet TAKE 2 TABLETS BY MOUTH EVERY DAY   lovastatin (MEVACOR) 40 MG tablet TAKE 1 TABLET (40 MG TOTAL) BY MOUTH 4 (FOUR) TIMES A WEEK. AT NIGHT   metFORMIN (GLUCOPHAGE) 500 MG tablet TAKE 1 TABLET BY MOUTH EVERY DAY WITH BREAKFAST   omeprazole (PRILOSEC) 40 MG capsule Take 1 capsule (40 mg total) by mouth daily as needed (acid reflux). (Patient taking differently: Take 40 mg by mouth daily as needed (acid reflux). Pt take OTC)   predniSONE (DELTASONE) 10 MG tablet Take one tab 3 x day for 3 days, then take one tab 2 x a day for 3 days and then take one tab a day for 3 days.   Semaglutide (RYBELSUS) 7 MG TABS Take 1 tablet (7 mg total) by mouth daily.   sertraline (ZOLOFT) 25 MG tablet TAKE 1 TABLET (25 MG TOTAL) BY MOUTH DAILY.   SUMAtriptan (IMITREX) 50 MG tablet TAKE 1 TABLET (50 MG TOTAL) BY MOUTH EVERY 2 (TWO) HOURS AS NEEDED FOR MIGRAINE. MAY REPEAT IN 2 HOURS IF HEADACHE PERSISTS OR RECURS.   traZODone (DESYREL) 50 MG tablet Take 1 tablet (50 mg total) by mouth at bedtime.   No facility-administered encounter medications on file as of 05/16/2023.    Surgical History: Past Surgical History:  Procedure Laterality Date  ABDOMINAL HYSTERECTOMY     CESAREAN SECTION  09/24/1987   COLONOSCOPY  09/23/2008   Dr Mechele Collin   COLONOSCOPY WITH PROPOFOL N/A 02/16/2021   Procedure: COLONOSCOPY WITH PROPOFOL;  Surgeon: Pasty Spillers, MD;  Location: [facility];  Service: Endoscopy;  Laterality: N/A;   CYSTOSCOPY N/A 11/09/2020   Procedure: CYSTOSCOPY;  Surgeon: Vena Austria, MD;  Location: [facility];  Service: Gynecology;  Laterality: N/A;   KNEE SURGERY Right 09/23/1997   TOTAL LAPAROSCOPIC HYSTERECTOMY WITH SALPINGECTOMY Bilateral 11/09/2020    Procedure: TOTAL LAPAROSCOPIC HYSTERECTOMY WITH BILATERAL SALPINGECTOMY;  Surgeon: Vena Austria, MD;  Location: [facility];  Service: Gynecology;  Laterality: Bilateral;    Medical History: Past Medical History:  Diagnosis Date   Anxiety    Colon polyp 2010   GERD (gastroesophageal reflux disease)    Hypercholesteremia    Hyperlipidemia    Hypertension    Migraine     Family History: Family History  Problem Relation Age of Onset   Diabetes Mother    Hypertension Mother    Heart disease Mother    Hypertension Father    Heart disease Father    Breast cancer Neg Hx     Social History   Socioeconomic History   Marital status: Married    Spouse name: Not on file   Number of children: Not on file   Years of education: Not on file   Highest education level: Not on file  Occupational History   Not on file  Tobacco Use   Smoking status: Never   Smokeless tobacco: Never  Vaping Use   Vaping status: Never Used  Substance and Sexual Activity   Alcohol use: Yes    Comment: occasionally   Drug use: No   Sexual activity: Yes    Birth control/protection: Surgical    Comment: Hysterectomy  Other Topics Concern   Not on file  Social History Narrative   Not on file   Social Determinants of Health   Financial Resource Strain: Not on file  Food Insecurity: Not on file  Transportation Needs: Not on file  Physical Activity: Not on file  Stress: Not on file  Social Connections: Not on file  Intimate Partner Violence: Not on file      Review of Systems  Constitutional:  Positive for chills, fatigue and fever.  HENT:  Positive for congestion and postnasal drip. Negative for mouth sores.   Respiratory:  Positive for cough. Negative for shortness of breath.   Cardiovascular:  Negative for chest pain.  Genitourinary:  Negative for flank pain.  Musculoskeletal:  Positive for myalgias.  Psychiatric/Behavioral: Negative.      Vital Signs: Ht 4\' 9"  (1.448 m)   Wt 180  lb (81.6 kg)   LMP 09/27/2020   BMI 38.95 kg/m    Observation/Objective:  Pt is able to carry out conversation   Assessment/Plan: 1. Acute COVID-19 May start zpak and prednisone to help with symptoms reduction, may try flonase and mucinex to help congestion. - azithromycin (ZITHROMAX) 250 MG tablet; Take one tab a day for 10 days for uri  Dispense: 10 tablet; Refill: 0 - predniSONE (DELTASONE) 10 MG tablet; Take one tab 3 x day for 3 days, then take one tab 2 x a day for 3 days and then take one tab a day for 3 days.  Dispense: 18 tablet; Refill: 0   General Counseling: micayla romm understanding of the findings of today's phone visit and agrees with plan of treatment. I have discussed  any further diagnostic evaluation that may be needed or ordered today. We also reviewed her medications today. she has been encouraged to call the office with any questions or concerns that should arise related to todays visit.    No orders of the defined types were placed in this encounter.   Meds ordered this encounter  Medications   azithromycin (ZITHROMAX) 250 MG tablet    Sig: Take one tab a day for 10 days for uri    Dispense:  10 tablet    Refill:  0   predniSONE (DELTASONE) 10 MG tablet    Sig: Take one tab 3 x day for 3 days, then take one tab 2 x a day for 3 days and then take one tab a day for 3 days.    Dispense:  18 tablet    Refill:  0    Time spent:25 Minutes    Dr Lyndon Code Internal medicine

## 2023-06-05 ENCOUNTER — Telehealth: Payer: Self-pay

## 2023-06-05 NOTE — Telephone Encounter (Signed)
Completed appeal for patient's Rybelsus.

## 2023-06-18 ENCOUNTER — Other Ambulatory Visit: Payer: Self-pay | Admitting: Physician Assistant

## 2023-06-18 DIAGNOSIS — G43009 Migraine without aura, not intractable, without status migrainosus: Secondary | ICD-10-CM

## 2023-06-24 ENCOUNTER — Other Ambulatory Visit: Payer: Self-pay | Admitting: Physician Assistant

## 2023-06-24 DIAGNOSIS — E1165 Type 2 diabetes mellitus with hyperglycemia: Secondary | ICD-10-CM

## 2023-07-10 ENCOUNTER — Ambulatory Visit: Payer: BC Managed Care – PPO | Admitting: Physician Assistant

## 2023-07-10 ENCOUNTER — Telehealth: Payer: Self-pay

## 2023-07-10 ENCOUNTER — Encounter: Payer: Self-pay | Admitting: Physician Assistant

## 2023-07-10 VITALS — BP 110/70 | HR 89 | Temp 97.8°F | Resp 16 | Ht <= 58 in | Wt 175.6 lb

## 2023-07-10 DIAGNOSIS — E1165 Type 2 diabetes mellitus with hyperglycemia: Secondary | ICD-10-CM

## 2023-07-10 DIAGNOSIS — R058 Other specified cough: Secondary | ICD-10-CM | POA: Diagnosis not present

## 2023-07-10 DIAGNOSIS — N390 Urinary tract infection, site not specified: Secondary | ICD-10-CM

## 2023-07-10 DIAGNOSIS — S29012A Strain of muscle and tendon of back wall of thorax, initial encounter: Secondary | ICD-10-CM

## 2023-07-10 DIAGNOSIS — R300 Dysuria: Secondary | ICD-10-CM

## 2023-07-10 DIAGNOSIS — K219 Gastro-esophageal reflux disease without esophagitis: Secondary | ICD-10-CM

## 2023-07-10 DIAGNOSIS — R1310 Dysphagia, unspecified: Secondary | ICD-10-CM | POA: Diagnosis not present

## 2023-07-10 LAB — POCT GLYCOSYLATED HEMOGLOBIN (HGB A1C): Hemoglobin A1C: 5.9 % — AB (ref 4.0–5.6)

## 2023-07-10 LAB — POCT URINALYSIS DIPSTICK
Bilirubin, UA: NEGATIVE
Blood, UA: NEGATIVE
Glucose, UA: NEGATIVE
Ketones, UA: POSITIVE
Leukocytes, UA: NEGATIVE
Nitrite, UA: NEGATIVE
Protein, UA: NEGATIVE
Spec Grav, UA: 1.01 (ref 1.010–1.025)
Urobilinogen, UA: 0.2 U/dL
pH, UA: 5 (ref 5.0–8.0)

## 2023-07-10 MED ORDER — SEMAGLUTIDE(0.25 OR 0.5MG/DOS) 2 MG/3ML ~~LOC~~ SOPN
0.2500 mg | PEN_INJECTOR | SUBCUTANEOUS | 0 refills | Status: AC
Start: 2023-07-10 — End: ?

## 2023-07-10 NOTE — Progress Notes (Signed)
[facility] [address] [place], [place] [address]  Internal MEDICINE  Office Visit Note  Patient Name: Joanne Maddox  [id number]  [id number]  Date of Service: 07/17/2023  Chief Complaint  Patient presents with   Follow-up   Gastroesophageal Reflux   Hypertension   Hyperlipidemia   Diabetes    HPI Pt is here for routine follow up -some right flank pain still, had gotten a little better after being treated for UTI, but now is worse again.. No incontinence or increased urgency. Some nighttime frequency. On further exam pain is higher up under shoulder blades bilaterally and pt does have to reach and lift at work and may be more muscular in nature. She will be mindful ab out body mechanics and may try topicals to avoid NSAIDs -Never got rybelsus. It was denied. Will try ozempic -down 5lbs since last visit, A1c is improved -Still some choking and coughing at night since covid, only happens at night. Sometimes things feel stuck in throat swallowing. Will check upper GI  Current Medication: Outpatient Encounter Medications as of 07/10/2023  Medication Sig   ALPRAZolam (XANAX) 0.25 MG tablet Take 1 tablet (0.25 mg total) by mouth at bedtime as needed for anxiety.   azithromycin (ZITHROMAX) 250 MG tablet Take one tab a day for 10 days for uri   Blood Glucose Monitoring Suppl (ACCU-CHEK GUIDE ME) w/Device KIT Use as directed. E11.65   celecoxib (CELEBREX) 200 MG capsule TAKE 1 CAPSULE BY MOUTH TWICE A DAY   cyclobenzaprine (FLEXERIL) 10 MG tablet Take 1 tablet (10 mg total) by mouth 3 (three) times daily as needed for muscle spasms.   fluticasone (FLONASE) 50 MCG/ACT nasal spray Place 2 sprays into both nostrils daily as needed for allergies.   ibuprofen (ADVIL) 600 MG tablet Take 1 tablet (600 mg total) by mouth every 6 (six) hours as needed for mild pain or cramping (pain).   lisinopril-hydrochlorothiazide (ZESTORETIC) 20-12.5 MG tablet TAKE 2 TABLETS BY MOUTH EVERY DAY    lovastatin (MEVACOR) 40 MG tablet TAKE 1 TABLET (40 MG TOTAL) BY MOUTH 4 (FOUR) TIMES A WEEK. AT NIGHT   metFORMIN (GLUCOPHAGE) 500 MG tablet TAKE 1 TABLET BY MOUTH EVERY DAY WITH BREAKFAST   omeprazole (PRILOSEC) 40 MG capsule Take 1 capsule (40 mg total) by mouth daily as needed (acid reflux). (Patient taking differently: Take 40 mg by mouth daily as needed (acid reflux). Pt take OTC)   predniSONE (DELTASONE) 10 MG tablet Take one tab 3 x day for 3 days, then take one tab 2 x a day for 3 days and then take one tab a day for 3 days.   Semaglutide,0.25 or 0.5MG /DOS, 2 MG/3ML SOPN Inject 0.25 mg into the skin once a week.   sertraline (ZOLOFT) 25 MG tablet TAKE 1 TABLET (25 MG TOTAL) BY MOUTH DAILY.   SUMAtriptan (IMITREX) 50 MG tablet TAKE 1 TABLET (50 MG TOTAL) BY MOUTH EVERY 2 (TWO) HOURS AS NEEDED FOR MIGRAINE. MAY REPEAT IN 2 HOURS IF HEADACHE PERSISTS OR RECURS.   traZODone (DESYREL) 50 MG tablet Take 1 tablet (50 mg total) by mouth at bedtime.   [DISCONTINUED] Semaglutide (RYBELSUS) 7 MG TABS Take 1 tablet (7 mg total) by mouth daily.   No facility-administered encounter medications on file as of 07/10/2023.    Surgical History: Past Surgical History:  Procedure Laterality Date   ABDOMINAL HYSTERECTOMY     CESAREAN SECTION  09/24/1987   COLONOSCOPY  09/23/2008   Dr Mechele Collin  COLONOSCOPY WITH PROPOFOL N/A 02/16/2021   Procedure: COLONOSCOPY WITH PROPOFOL;  Surgeon: Pasty Spillers, MD;  Location: [facility];  Service: Endoscopy;  Laterality: N/A;   CYSTOSCOPY N/A 11/09/2020   Procedure: CYSTOSCOPY;  Surgeon: Vena Austria, MD;  Location: [facility] OR[facility];  Service: Gynecology;  Laterality: N/A;   KNEE SURGERY Right 09/23/1997   TOTAL LAPAROSCOPIC HYSTERECTOMY WITH SALPINGECTOMY Bilateral 11/09/2020   Procedure: TOTAL LAPAROSCOPIC HYSTERECTOMY WITH BILATERAL SALPINGECTOMY;  Surgeon: Vena Austria, MD;  Location: [facility] OR[facility];  Service: Gynecology;  Laterality: Bilateral;     Medical History: Past Medical History:  Diagnosis Date   Anxiety    Colon polyp 2010   GERD (gastroesophageal reflux disease)    Hypercholesteremia    Hyperlipidemia    Hypertension    Migraine     Family History: Family History  Problem Relation Age of Onset   Diabetes Mother    Hypertension Mother    Heart disease Mother    Hypertension Father    Heart disease Father    Breast cancer Neg Hx     Social History   Socioeconomic History   Marital status: Married    Spouse name: Not on file   Number of children: Not on file   Years of education: Not on file   Highest education level: Not on file  Occupational History   Not on file  Tobacco Use   Smoking status: Never   Smokeless tobacco: Never  Vaping Use   Vaping status: Never Used  Substance and Sexual Activity   Alcohol use: Yes    Comment: occasionally   Drug use: No   Sexual activity: Yes    Birth control/protection: Surgical    Comment: Hysterectomy  Other Topics Concern   Not on file  Social History Narrative   Not on file   Social Determinants of Health   Financial Resource Strain: Not on file  Food Insecurity: Not on file  Transportation Needs: Not on file  Physical Activity: Not on file  Stress: Not on file  Social Connections: Not on file  Intimate Partner Violence: Not on file      Review of Systems  Constitutional:  Positive for fatigue. Negative for chills and unexpected weight change.  HENT:  Positive for postnasal drip and trouble swallowing. Negative for congestion, rhinorrhea, sneezing and sore throat.   Eyes:  Negative for redness.  Respiratory:  Positive for cough. Negative for chest tightness and shortness of breath.        Cough at night only  Cardiovascular:  Negative for chest pain and palpitations.  Gastrointestinal:  Negative for abdominal pain, constipation, diarrhea, nausea and vomiting.  Genitourinary:  Negative for dysuria and frequency.  Musculoskeletal:   Positive for myalgias. Negative for arthralgias, back pain, joint swelling and neck pain.  Skin:  Negative for rash.  Neurological: Negative.  Negative for tremors and numbness.  Hematological:  Negative for adenopathy. Does not bruise/bleed easily.  Psychiatric/Behavioral:  Positive for sleep disturbance. Negative for behavioral problems (Depression) and suicidal ideas. The patient is not nervous/anxious.     Vital Signs: BP 110/70   Pulse 89   Temp 97.8 F (36.6 C)   Resp 16   Ht 4\' 9"  (1.448 m)   Wt 175 lb 9.6 oz (79.7 kg)   LMP 09/27/2020   SpO2 98%   BMI 38.00 kg/m    Physical Exam Vitals and nursing note reviewed.  Constitutional:      General: She is not in acute distress.  Appearance: She is well-developed. She is obese. She is not diaphoretic.  HENT:     Head: Normocephalic and atraumatic.     Mouth/Throat:     Pharynx: No oropharyngeal exudate.  Eyes:     Pupils: Pupils are equal, round, and reactive to light.  Neck:     Thyroid: No thyromegaly.     Vascular: No JVD.     Trachea: No tracheal deviation.  Cardiovascular:     Rate and Rhythm: Normal rate and regular rhythm.     Heart sounds: Normal heart sounds. No murmur heard.    No friction rub. No gallop.  Pulmonary:     Effort: Pulmonary effort is normal. No respiratory distress.     Breath sounds: No wheezing or rales.  Chest:     Chest wall: No tenderness.  Abdominal:     General: Bowel sounds are normal.     Palpations: Abdomen is soft.  Musculoskeletal:        General: Tenderness present. Normal range of motion.     Cervical back: Normal range of motion and neck supple.     Comments: Tenderness along bilateral latissimus dorsi muscles  Lymphadenopathy:     Cervical: No cervical adenopathy.  Skin:    General: Skin is warm and dry.  Neurological:     Mental Status: She is alert and oriented to person, place, and time.     Cranial Nerves: No cranial nerve deficit.  Psychiatric:         Behavior: Behavior normal.        Thought Content: Thought content normal.        Judgment: Judgment normal.        Assessment/Plan: 1. Type 2 diabetes mellitus with hyperglycemia, without long-term current use of insulin (HCC) - POCT HgB A1C is 5.9 which is improved from 6.4 last visit. Will try to start ozempic to help further - Urine Microalbumin w/creat. ratio - Semaglutide,0.25 or 0.5MG /DOS, 2 MG/3ML SOPN; Inject 0.25 mg into the skin once a week.  Dispense: 3 mL; Refill: 0  2. Dysphagia, unspecified type Will check upper GI - DG UGI W SINGLE CM (SOL OR THIN BA); Future  3. Gastroesophageal reflux disease, unspecified whether esophagitis present Continue PPI and check upper GI - DG UGI W SINGLE CM (SOL OR THIN BA); Future  4. Nocturnal cough Likely due to reflux/esophageal spasm and will check upper GI first and continue PPI - DG UGI W SINGLE CM (SOL OR THIN BA); Future  5. Strain of both latissimus dorsi muscles Will try to improve posture and body mechanics and may try topicals to help  6. Urinary tract infection without hematuria, site unspecified Will send culture to ensure no infection - CULTURE, URINE COMPREHENSIVE  7. Dysuria - POCT Urinalysis Dipstick   General Counseling: Joanne Maddox understanding of the findings of todays visit and agrees with plan of treatment. I have discussed any further diagnostic evaluation that may be needed or ordered today. We also reviewed her medications today. she has been encouraged to call the office with any questions or concerns that should arise related to todays visit.    Orders Placed This Encounter  Procedures   CULTURE, URINE COMPREHENSIVE   DG UGI W SINGLE CM (SOL OR THIN BA)   Urine Microalbumin w/creat. ratio   POCT HgB A1C   POCT Urinalysis Dipstick    Meds ordered this encounter  Medications   Semaglutide,0.25 or 0.5MG /DOS, 2 MG/3ML SOPN    Sig: Inject  0.25 mg into the skin once a week.    Dispense:   3 mL    Refill:  0    This patient was seen by Lynn Ito, PA-C in collaboration with Dr. Beverely Risen as a part of collaborative care agreement.   Total time spent:30 Minutes Time spent includes review of chart, medications, test results, and follow up plan with the patient.      Dr Lyndon Code Internal medicine

## 2023-07-10 NOTE — Telephone Encounter (Signed)
Completed P.A. for patient's Ozempic.

## 2023-07-11 LAB — MICROALBUMIN / CREATININE URINE RATIO
Creatinine, Urine: 111.6 mg/dL
Microalb/Creat Ratio: 7 mg/g{creat} (ref 0–29)
Microalbumin, Urine: 7.5 ug/mL

## 2023-07-14 LAB — CULTURE, URINE COMPREHENSIVE

## 2023-07-17 ENCOUNTER — Telehealth: Payer: Self-pay | Admitting: Obstetrics & Gynecology

## 2023-07-17 NOTE — Telephone Encounter (Signed)
LM for patient to call office back to schedule annual. Patient is due after 08/06/23

## 2023-07-19 ENCOUNTER — Other Ambulatory Visit: Payer: Self-pay | Admitting: Physician Assistant

## 2023-07-19 DIAGNOSIS — I10 Essential (primary) hypertension: Secondary | ICD-10-CM

## 2023-08-13 ENCOUNTER — Telehealth: Payer: Self-pay | Admitting: Physician Assistant

## 2023-08-13 NOTE — Telephone Encounter (Signed)
Patient called stating she never received call from [facility] to schedule ugi and does not need now since she is no longer having symptoms. Order cancelled-Toni

## 2023-08-18 ENCOUNTER — Other Ambulatory Visit: Payer: Self-pay | Admitting: Internal Medicine

## 2023-08-18 DIAGNOSIS — Z1231 Encounter for screening mammogram for malignant neoplasm of breast: Secondary | ICD-10-CM

## 2023-09-03 ENCOUNTER — Ambulatory Visit: Payer: BC Managed Care – PPO | Admitting: Licensed Practical Nurse

## 2023-09-03 ENCOUNTER — Other Ambulatory Visit (HOSPITAL_COMMUNITY)
Admission: RE | Admit: 2023-09-03 | Discharge: 2023-09-03 | Disposition: A | Discharge status: Home / Self Care | Payer: BC Managed Care – PPO | Source: Ambulatory Visit | Attending: Licensed Practical Nurse | Admitting: Licensed Practical Nurse

## 2023-09-03 VITALS — BP 121/70 | HR 80 | Wt 177.9 lb

## 2023-09-03 DIAGNOSIS — M545 Low back pain, unspecified: Secondary | ICD-10-CM

## 2023-09-03 DIAGNOSIS — R399 Unspecified symptoms and signs involving the genitourinary system: Secondary | ICD-10-CM | POA: Diagnosis not present

## 2023-09-03 DIAGNOSIS — Z124 Encounter for screening for malignant neoplasm of cervix: Secondary | ICD-10-CM | POA: Insufficient documentation

## 2023-09-03 DIAGNOSIS — Z113 Encounter for screening for infections with a predominantly sexual mode of transmission: Secondary | ICD-10-CM | POA: Diagnosis not present

## 2023-09-03 DIAGNOSIS — Z01419 Encounter for gynecological examination (general) (routine) without abnormal findings: Secondary | ICD-10-CM | POA: Diagnosis not present

## 2023-09-03 DIAGNOSIS — Z23 Encounter for immunization: Secondary | ICD-10-CM | POA: Diagnosis not present

## 2023-09-03 LAB — POCT URINALYSIS DIPSTICK
Bilirubin, UA: NEGATIVE
Blood, UA: NEGATIVE
Glucose, UA: NEGATIVE
Ketones, UA: NEGATIVE
Leukocytes, UA: NEGATIVE
Nitrite, UA: NEGATIVE
Protein, UA: NEGATIVE
Spec Grav, UA: 1.015 (ref 1.010–1.025)
Urobilinogen, UA: 0.2 U/dL
pH, UA: 7 (ref 5.0–8.0)

## 2023-09-03 NOTE — Progress Notes (Signed)
Gynecology Annual Exam  PCP: Carlean Jews, PA-C  Chief Complaint:  Chief Complaint  Patient presents with   Gynecologic Exam    History of Present Illness:Patient is a 52 y.o. G1P1001 presents for annual exam. The patient reports  Low back pain: the pain is on both sides, at one time she was told she ahd a UTI and was treated, she then saw her PCP that told her it is not a UTI and probably related her work. She has not seen PT. Motrin makes the pain tolerable, heat does help.  LMP: Patient's last menstrual period was 09/27/2020. Had hysterectomy   The patient is sexually active with 1 female partner. She admits to dyspareunia (has been happening since her hysterectomy).  The patient does perform self breast exams.  There is no notable family history of breast or ovarian cancer in her family. She does have one tattoo which she got when her daughter turned 66.   The patient wears seatbelts: yes.   The patient has regular exercise:  is active at work .    The patient denies current symptoms of depression.   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  Lives with her husband, feels safe PCP last seen in Sept Wears glasses has exam scheduled Dental is due for apt Gets Vitamin D through a supplement and Calcium by consuming dairy   Review of Systems: ROS see HPI  Past Medical History:  Patient Active Problem List   Diagnosis Date Noted Date Diagnosed   Encounter for screening colonoscopy     S/P laparoscopic supracervical hysterectomy 11/09/2020    Abnormal uterine bleeding (AUB)     Pelvic pain in female     Encounter for general adult medical examination with abnormal findings 01/12/2020    Impaired fasting glucose 10/24/2019    Abnormal weight gain 10/24/2019    Generalized abdominal tenderness without rebound tenderness 08/01/2019    Irritable bowel syndrome with  constipation 08/01/2019    Sore throat 08/05/2018    Iron deficiency anemia 08/05/2018    Screening for breast cancer 12/17/2017    Acute sinusitis, unspecified 09/17/2017    Chronic mucoid otitis media, unspecified ear 09/17/2017    Acute pharyngitis 09/17/2017    Uterine leiomyoma 09/17/2017    Excessive and frequent menstruation with irregular cycle 09/17/2017    Dorsalgia, unspecified 09/17/2017    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    Migraine with aura, intractable, without status migrainosus 09/17/2017    Vitamin D deficiency 09/17/2017    Chest pain, unspecified 09/17/2017    Pain in right shoulder 09/17/2017    Myalgia 09/17/2017    Other fatigue 09/17/2017    Generalized abdominal pain 09/17/2017    Other constipation 09/17/2017    Gastro-esophageal reflux disease without esophagitis 09/17/2017    Mixed hyperlipidemia 09/17/2017    Essential hypertension 09/17/2017    Generalized anxiety disorder 09/17/2017    Dizziness and giddiness 09/17/2017    Low back pain 09/17/2017    Difficult or painful urination 09/17/2017    Gall bladder polyp 01/07/2014     Past Surgical History:  Past Surgical History:  Procedure Laterality Date   ABDOMINAL HYSTERECTOMY     CESAREAN SECTION  09/24/1987   COLONOSCOPY  09/23/2008   Dr Mechele Collin   COLONOSCOPY WITH PROPOFOL N/A 02/16/2021   Procedure: COLONOSCOPY WITH PROPOFOL;  Surgeon: Pasty Spillers,  MD;  Location: [facility];  Service: Endoscopy;  Laterality: N/A;   CYSTOSCOPY N/A 11/09/2020   Procedure: CYSTOSCOPY;  Surgeon: Vena Austria, MD;  Location: [facility] OR[facility];  Service: Gynecology;  Laterality: N/A;   KNEE SURGERY Right 09/23/1997   TOTAL LAPAROSCOPIC HYSTERECTOMY WITH SALPINGECTOMY Bilateral 11/09/2020   Procedure: TOTAL LAPAROSCOPIC HYSTERECTOMY WITH BILATERAL SALPINGECTOMY;  Surgeon: Vena Austria, MD;  Location: [facility];  Service: Gynecology;  Laterality: Bilateral;    Gynecologic History:  Patient's last  menstrual period was 09/27/2020. Last Pap: Results were: 2021 no abnormalities  Last mammogram: 2023 Results were: BI-RAD I  Obstetric History: G1P1001  Family History:  Family History  Problem Relation Age of Onset   Diabetes Mother    Hypertension Mother    Heart disease Mother    Hypertension Father    Heart disease Father    Breast cancer Neg Hx     Social History:  Social History   Socioeconomic History   Marital status: Married    Spouse name: Not on file   Number of children: Not on file   Years of education: Not on file   Highest education level: Not on file  Occupational History   Not on file  Tobacco Use   Smoking status: Never   Smokeless tobacco: Never  Vaping Use   Vaping status: Never Used  Substance and Sexual Activity   Alcohol use: Yes    Comment: occasionally   Drug use: No   Sexual activity: Yes    Birth control/protection: Surgical    Comment: Hysterectomy  Other Topics Concern   Not on file  Social History Narrative   Not on file   Social Determinants of Health   Financial Resource Strain: Not on file  Food Insecurity: Not on file  Transportation Needs: Not on file  Physical Activity: Not on file  Stress: Not on file  Social Connections: Not on file  Intimate Partner Violence: Not on file    Allergies:  Allergies  Allergen Reactions   Bactrim [Sulfamethoxazole-Trimethoprim] Hives    Medications: Prior to Admission medications   Medication Sig Start Date End Date Taking? Authorizing Provider  Blood Glucose Monitoring Suppl (ACCU-CHEK GUIDE ME) w/Device KIT Use as directed. E11.65 04/10/23  Yes McDonough, Lauren K, PA-C  celecoxib (CELEBREX) 200 MG capsule TAKE 1 CAPSULE BY MOUTH TWICE A DAY 12/26/22  Yes Abernathy, Alyssa, NP  fluticasone (FLONASE) 50 MCG/ACT nasal spray Place 2 sprays into both nostrils daily as needed for allergies. 11/15/20  Yes Theotis Burrow, NP  ibuprofen (ADVIL) 600 MG tablet Take 1 tablet (600 mg total) by  mouth every 6 (six) hours as needed for mild pain or cramping (pain). 11/21/20  Yes Vena Austria, MD  lisinopril-hydrochlorothiazide (ZESTORETIC) 20-12.5 MG tablet TAKE 2 TABLETS BY MOUTH EVERY DAY 07/21/23  Yes McDonough, Lauren K, PA-C  lovastatin (MEVACOR) 40 MG tablet TAKE 1 TABLET (40 MG TOTAL) BY MOUTH 4 (FOUR) TIMES A WEEK. AT NIGHT 12/23/22  Yes McDonough, Lauren K, PA-C  metFORMIN (GLUCOPHAGE) 500 MG tablet TAKE 1 TABLET BY MOUTH EVERY DAY WITH BREAKFAST 04/28/23  Yes McDonough, Lauren K, PA-C  omeprazole (PRILOSEC) 40 MG capsule Take 1 capsule (40 mg total) by mouth daily as needed (acid reflux). Patient taking differently: Take 40 mg by mouth daily as needed (acid reflux). Pt take OTC 11/15/20  Yes Theotis Burrow, NP  SUMAtriptan (IMITREX) 50 MG tablet TAKE 1 TABLET (50 MG TOTAL) BY MOUTH EVERY 2 (TWO) HOURS AS NEEDED FOR  MIGRAINE. MAY REPEAT IN 2 HOURS IF HEADACHE PERSISTS OR RECURS. 06/18/23  Yes McDonough, Lauren K, PA-C  traZODone (DESYREL) 50 MG tablet Take 1 tablet (50 mg total) by mouth at bedtime. 02/14/23  Yes McDonough, Lauren K, PA-C  ALPRAZolam (XANAX) 0.25 MG tablet Take 1 tablet (0.25 mg total) by mouth at bedtime as needed for anxiety. Patient not taking: Reported on 09/03/2023 07/05/21   Carlean Jews, PA-C  azithromycin (ZITHROMAX) 250 MG tablet Take one tab a day for 10 days for uri Patient not taking: Reported on 09/03/2023 05/16/23   Carlean Jews, PA-C  cyclobenzaprine (FLEXERIL) 10 MG tablet Take 1 tablet (10 mg total) by mouth 3 (three) times daily as needed for muscle spasms. Patient not taking: Reported on 09/03/2023 12/20/20   Vena Austria, MD  predniSONE (DELTASONE) 10 MG tablet Take one tab 3 x day for 3 days, then take one tab 2 x a day for 3 days and then take one tab a day for 3 days. Patient not taking: Reported on 09/03/2023 05/16/23   Carlean Jews, PA-C  Semaglutide,0.25 or 0.5MG /DOS, 2 MG/3ML SOPN Inject 0.25 mg into the skin once a  week. Patient not taking: Reported on 09/03/2023 07/10/23   Carlean Jews, PA-C  sertraline (ZOLOFT) 25 MG tablet TAKE 1 TABLET (25 MG TOTAL) BY MOUTH DAILY. Patient not taking: Reported on 09/03/2023 11/18/22   Carlean Jews, PA-C    Physical Exam Vitals: Blood pressure 121/70, pulse 80, weight 177 lb 14.4 oz (80.7 kg), last menstrual period 09/27/2020.  General: NAD HEENT: normocephalic, anicteric Thyroid: no enlargement, no palpable nodules Pulmonary: No increased work of breathing, CTAB Cardiovascular: RRR, distal pulses 2+ Breast: Breast symmetrical, no tenderness, no palpable nodules or masses, no skin or nipple retraction present, no nipple discharge.  No axillary or supraclavicular lymphadenopathy. Abdomen: NABS, soft, non-tender, non-distended.  Umbilicus without lesions.  No hepatomegaly, splenomegaly or masses palpable. No evidence of hernia  Genitourinary:  External: Normal external female genitalia.  Normal urethral meatus, normal Bartholin's and Skene's glands.    Vagina: Normal vaginal mucosa, no evidence of prolapse.  Good tone  Cervix: Grossly normal in appearance, no bleeding  Uterus: surgically absent  Adnexa: ovaries non-enlarged, no adnexal masses  Rectal: deferred  Lymphatic: no evidence of inguinal lymphadenopathy Extremities: no edema, erythema, or tenderness Neurologic: Grossly intact Psychiatric: mood appropriate, affect full    Assessment: 52 y.o. G1P1001 routine annual exam  Plan: Problem List Items Addressed This Visit       Other   Low back pain - Primary   Relevant Orders   Ambulatory referral to Physical Therapy   Other Visit Diagnoses     UTI symptoms       Relevant Orders   POCT Urinalysis Dipstick (Completed)   Urine Culture   Encounter for immunization       Relevant Orders   Flu vaccine trivalent PF, 6mos and older(Flulaval,Afluria,Fluarix,Fluzone) (Completed)   Well woman exam       Relevant Orders   Hepatitis C  antibody   Cytology - PAP   Ambulatory referral to Physical Therapy   Screening examination for venereal disease       Relevant Orders   Hepatitis C antibody   Cytology - PAP   Cervical cancer screening       Relevant Orders   Cytology - PAP       1) Mammogram - recommend yearly screening mammogram.  Mammogram  scheduled for 12/18  2) STI screening  wasoffered and declined  3) ASCCP guidelines and rational discussed.  Patient opts for every 3 years screening interval  4) Osteoporosis  - per USPTF routine screening DEXA at age 17   Consider FDA-approved medical therapies in postmenopausal women and men aged 32 years and older, based on the following: a) A hip or vertebral (clinical or morphometric) fracture b) T-score <= -2.5 at the femoral neck or spine after appropriate evaluation to exclude secondary causes C) Low bone mass (T-score between -1.0 and -2.5 at the femoral neck or spine) and a 10-year probability of a hip fracture >= 3% or a 10-year probability of a major osteoporosis-related fracture >= 20% based on the [place]-adapted WHO algorithm   5) Routine healthcare maintenance including cholesterol, diabetes screening discussed managed by PCP -Offered screening for Hep C, it appears she has never been screened for it, declines.   6) Colonoscopy up to date.  Screening recommended starting at age 64 for average risk individuals, age 89 for individuals deemed at increased risk (including African Americans) and recommended to continue until age 15.  For patient age 52-85 individualized approach is recommended.  Gold standard screening is via colonoscopy, Cologuard screening is an acceptable alternative for patient unwilling or unable to undergo colonoscopy.  "Colorectal cancer screening for average?risk adults: 2018 guideline update from the American Cancer Society"CA: A Cancer Journal for Clinicians: Feb 19, 2017   7) Painful intercourse: discussed trial with estrogen cream, pt  declined, may consider plant based estrogen cream  8) RTC 1 year    Carie Caddy, [place]  [facility] 09/03/2023, 2:31 PM

## 2023-09-05 LAB — CYTOLOGY - PAP
Chlamydia: NEGATIVE
Comment: NEGATIVE
Comment: NEGATIVE
Comment: NORMAL
Diagnosis: NEGATIVE
High risk HPV: NEGATIVE
Neisseria Gonorrhea: NEGATIVE

## 2023-09-05 LAB — URINE CULTURE: Organism ID, Bacteria: NO GROWTH

## 2023-09-10 ENCOUNTER — Ambulatory Visit
Admission: RE | Admit: 2023-09-10 | Discharge: 2023-09-10 | Disposition: A | Discharge status: Home / Self Care | Payer: BC Managed Care – PPO | Source: Ambulatory Visit | Attending: Internal Medicine | Admitting: Internal Medicine

## 2023-09-10 DIAGNOSIS — Z1231 Encounter for screening mammogram for malignant neoplasm of breast: Secondary | ICD-10-CM | POA: Insufficient documentation

## 2023-09-22 ENCOUNTER — Other Ambulatory Visit: Payer: Self-pay

## 2023-09-22 ENCOUNTER — Telehealth: Payer: Self-pay

## 2023-09-22 DIAGNOSIS — E782 Mixed hyperlipidemia: Secondary | ICD-10-CM

## 2023-09-22 DIAGNOSIS — R7301 Impaired fasting glucose: Secondary | ICD-10-CM

## 2023-09-22 MED ORDER — METFORMIN HCL 500 MG PO TABS: ORAL_TABLET | ORAL | 1 refills | Status: DC

## 2023-09-22 MED ORDER — LOVASTATIN 40 MG PO TABS: ORAL_TABLET | ORAL | 3 refills | Status: DC

## 2023-09-23 ENCOUNTER — Other Ambulatory Visit: Payer: Self-pay

## 2023-09-23 DIAGNOSIS — F411 Generalized anxiety disorder: Secondary | ICD-10-CM

## 2023-09-23 MED ORDER — SERTRALINE HCL 25 MG PO TABS: 25.0000 mg | ORAL_TABLET | Freq: Every day | ORAL | 3 refills | Status: DC

## 2023-09-23 NOTE — Telephone Encounter (Signed)
As per Joanne Maddox advised that we can send pres to phar and its ok to restart

## 2023-09-25 ENCOUNTER — Telehealth: Payer: Self-pay

## 2023-09-25 NOTE — Telephone Encounter (Signed)
 Pt called that she have burn on her stomach during christmas she was doing OTC today she had irritated advised her as per alyssa to go to Urgent care

## 2023-09-29 DIAGNOSIS — M1711 Unilateral primary osteoarthritis, right knee: Secondary | ICD-10-CM | POA: Diagnosis not present

## 2023-10-19 DIAGNOSIS — I10 Essential (primary) hypertension: Secondary | ICD-10-CM | POA: Diagnosis not present

## 2023-10-19 DIAGNOSIS — R109 Unspecified abdominal pain: Secondary | ICD-10-CM | POA: Diagnosis not present

## 2023-10-19 DIAGNOSIS — R079 Chest pain, unspecified: Secondary | ICD-10-CM | POA: Diagnosis not present

## 2023-10-19 DIAGNOSIS — Z882 Allergy status to sulfonamides status: Secondary | ICD-10-CM | POA: Diagnosis not present

## 2023-10-19 DIAGNOSIS — R1011 Right upper quadrant pain: Secondary | ICD-10-CM | POA: Diagnosis not present

## 2023-10-19 DIAGNOSIS — Z7902 Long term (current) use of antithrombotics/antiplatelets: Secondary | ICD-10-CM | POA: Diagnosis not present

## 2023-10-19 DIAGNOSIS — F419 Anxiety disorder, unspecified: Secondary | ICD-10-CM | POA: Diagnosis not present

## 2023-10-19 DIAGNOSIS — R0789 Other chest pain: Secondary | ICD-10-CM | POA: Diagnosis not present

## 2023-10-19 DIAGNOSIS — R1010 Upper abdominal pain, unspecified: Secondary | ICD-10-CM | POA: Diagnosis not present

## 2023-10-19 DIAGNOSIS — Z79899 Other long term (current) drug therapy: Secondary | ICD-10-CM | POA: Diagnosis not present

## 2023-10-24 DIAGNOSIS — M25561 Pain in right knee: Secondary | ICD-10-CM | POA: Diagnosis not present

## 2023-10-27 ENCOUNTER — Ambulatory Visit: Payer: BC Managed Care – PPO | Admitting: Nurse Practitioner

## 2023-11-11 ENCOUNTER — Other Ambulatory Visit: Payer: Self-pay | Admitting: Physician Assistant

## 2023-11-11 DIAGNOSIS — I10 Essential (primary) hypertension: Secondary | ICD-10-CM

## 2024-01-17 ENCOUNTER — Other Ambulatory Visit: Payer: Self-pay | Admitting: Physician Assistant

## 2024-01-17 DIAGNOSIS — F411 Generalized anxiety disorder: Secondary | ICD-10-CM

## 2024-01-17 DIAGNOSIS — E782 Mixed hyperlipidemia: Secondary | ICD-10-CM

## 2024-01-23 ENCOUNTER — Ambulatory Visit (INDEPENDENT_AMBULATORY_CARE_PROVIDER_SITE_OTHER): Admitting: Physician Assistant

## 2024-01-23 ENCOUNTER — Encounter: Payer: Self-pay | Admitting: Physician Assistant

## 2024-01-23 VITALS — BP 120/70 | HR 97 | Temp 97.8°F | Resp 16 | Ht <= 58 in | Wt 185.0 lb

## 2024-01-23 DIAGNOSIS — R5383 Other fatigue: Secondary | ICD-10-CM

## 2024-01-23 DIAGNOSIS — M2550 Pain in unspecified joint: Secondary | ICD-10-CM

## 2024-01-23 DIAGNOSIS — R7989 Other specified abnormal findings of blood chemistry: Secondary | ICD-10-CM

## 2024-01-23 DIAGNOSIS — R7303 Prediabetes: Secondary | ICD-10-CM

## 2024-01-23 DIAGNOSIS — G4710 Hypersomnia, unspecified: Secondary | ICD-10-CM | POA: Diagnosis not present

## 2024-01-23 DIAGNOSIS — E538 Deficiency of other specified B group vitamins: Secondary | ICD-10-CM | POA: Diagnosis not present

## 2024-01-23 DIAGNOSIS — E782 Mixed hyperlipidemia: Secondary | ICD-10-CM

## 2024-01-23 DIAGNOSIS — D509 Iron deficiency anemia, unspecified: Secondary | ICD-10-CM

## 2024-01-23 DIAGNOSIS — E559 Vitamin D deficiency, unspecified: Secondary | ICD-10-CM | POA: Diagnosis not present

## 2024-01-23 DIAGNOSIS — M6281 Muscle weakness (generalized): Secondary | ICD-10-CM

## 2024-01-23 NOTE — Progress Notes (Signed)
 [facility] [address] [place], [place] [address]  Internal MEDICINE  Office Visit Note  Patient Name: Joanne Maddox  [id number]  [id number]  Date of Service: 01/23/2024  Chief Complaint  Patient presents with   Acute Visit   Fatigue    And pain in both leg     HPI Pt is here for a sick visit. -Fatigue going on for awhile -does not wake up refreshed -does get hot and cold and some brain fog. May be menopause releated since hysterectomy -unsure about snoring, has woken herself up choking/gasping  -a little trouble getting to sleep but once asleep does ok typically -husband recently had surgery so extra strain on her. Works 2 jobs. -also has some leg and joint pains and muscle fatigue, will check labs  EPWORTH SLEEPINESS SCALE:  Scale:  (0)= no chance of dozing; (1)= slight chance of dozing; (2)= moderate chance of dozing; (3)= high chance of dozing  Chance  Situtation    Sitting and reading: 1    Watching TV: 3    Sitting Inactive in public: 0    As a passenger in car: 3      Lying down to rest: 3    Sitting and talking: 0    Sitting quielty after lunch: 0    In a car, stopped in traffic: 0   TOTAL SCORE:   10 out of 24    Current Medication:  Outpatient Encounter Medications as of 01/23/2024  Medication Sig   ALPRAZolam  (XANAX ) 0.25 MG tablet Take 1 tablet (0.25 mg total) by mouth at bedtime as needed for anxiety.   Blood Glucose Monitoring Suppl (ACCU-CHEK GUIDE ME) w/Device KIT Use as directed. E11.65   celecoxib  (CELEBREX ) 200 MG capsule TAKE 1 CAPSULE BY MOUTH TWICE A DAY   cyclobenzaprine  (FLEXERIL ) 10 MG tablet Take 1 tablet (10 mg total) by mouth 3 (three) times daily as needed for muscle spasms.   fluticasone  (FLONASE ) 50 MCG/ACT nasal spray Place 2 sprays into both nostrils daily as needed for allergies.   ibuprofen  (ADVIL ) 600 MG tablet Take 1 tablet (600 mg total) by mouth every 6 (six) hours as needed for mild pain or cramping  (pain).   lisinopril -hydrochlorothiazide  (ZESTORETIC ) 20-12.5 MG tablet TAKE 2 TABLETS BY MOUTH EVERY DAY   lovastatin  (MEVACOR ) 40 MG tablet TAKE 1 TABLET BY MOUTH 4 TIMES A WEEK AT NIGHT   metFORMIN  (GLUCOPHAGE ) 500 MG tablet Take 1 tab po daily   omeprazole  (PRILOSEC) 40 MG capsule Take 1 capsule (40 mg total) by mouth daily as needed (acid reflux). (Patient taking differently: Take 40 mg by mouth daily as needed (acid reflux). Pt take OTC)   predniSONE  (DELTASONE ) 10 MG tablet Take one tab 3 x day for 3 days, then take one tab 2 x a day for 3 days and then take one tab a day for 3 days.   Semaglutide ,0.25 or 0.5MG /DOS, 2 MG/3ML SOPN Inject 0.25 mg into the skin once a week.   sertraline  (ZOLOFT ) 25 MG tablet TAKE 1 TABLET (25 MG TOTAL) BY MOUTH DAILY.   SUMAtriptan  (IMITREX ) 50 MG tablet TAKE 1 TABLET (50 MG TOTAL) BY MOUTH EVERY 2 (TWO) HOURS AS NEEDED FOR MIGRAINE. MAY REPEAT IN 2 HOURS IF HEADACHE PERSISTS OR RECURS.   traZODone  (DESYREL ) 50 MG tablet Take 1 tablet (50 mg total) by mouth at bedtime.   [DISCONTINUED] azithromycin  (ZITHROMAX ) 250 MG tablet Take one tab a day for 10 days for uri (Patient not  taking: Reported on 09/03/2023)   No facility-administered encounter medications on file as of 01/23/2024.      Medical History: Past Medical History:  Diagnosis Date   Anxiety    Colon polyp 2010   GERD (gastroesophageal reflux disease)    Hypercholesteremia    Hyperlipidemia    Hypertension    Migraine      Vital Signs: BP 120/70   Pulse 97   Temp 97.8 F (36.6 C)   Resp 16   Ht 4\' 9"  (1.448 m)   Wt 185 lb (83.9 kg)   LMP 09/27/2020   SpO2 97%   BMI 40.03 kg/m    Review of Systems  Constitutional:  Positive for fatigue. Negative for fever.  HENT:  Negative for congestion, mouth sores and postnasal drip.   Respiratory:  Negative for cough.   Cardiovascular:  Negative for chest pain and leg swelling.  Genitourinary:  Negative for flank pain.  Musculoskeletal:   Positive for arthralgias and myalgias.  Skin:  Negative for rash.  Psychiatric/Behavioral:  Positive for sleep disturbance.     Physical Exam Vitals and nursing note reviewed.  Constitutional:      General: She is not in acute distress.    Appearance: She is well-developed. She is obese. She is not diaphoretic.  HENT:     Head: Normocephalic and atraumatic.  Eyes:     Extraocular Movements: Extraocular movements intact.  Neck:     Thyroid : No thyromegaly.     Vascular: No JVD.     Trachea: No tracheal deviation.  Cardiovascular:     Rate and Rhythm: Normal rate and regular rhythm.     Heart sounds: Normal heart sounds. No murmur heard.    No friction rub. No gallop.  Pulmonary:     Effort: Pulmonary effort is normal. No respiratory distress.     Breath sounds: No wheezing or rales.  Chest:     Chest wall: No tenderness.  Musculoskeletal:        General: Normal range of motion.     Right lower leg: No edema.     Left lower leg: No edema.  Skin:    General: Skin is warm and dry.  Neurological:     General: No focal deficit present.     Mental Status: She is alert.  Psychiatric:        Behavior: Behavior normal.        Thought Content: Thought content normal.        Judgment: Judgment normal.       Assessment/Plan: 1. Hypersomnia (Primary) Given possible snoring, choking/gasping in sleep, poorly refreshing sleep, daytime fatigue, and elevated BMI, will move forward with PSG - PSG SLEEP STUDY; Future  2. Mixed hyperlipidemia - Lipid Panel With LDL/HDL Ratio  3. Vitamin D  deficiency - VITAMIN D  25 Hydroxy (Vit-D Deficiency, Fractures)  4. B12 deficiency - B12 and Folate Panel  5. Abnormal thyroid  blood test Will check thyroid  labs to ensure not contributing to fatigue - TSH + free T4  6. Polyarthralgia Will check labs for possible causes - ANA w/Reflex if Positive - CYCLIC CITRUL PEPTIDE ANTIBODY, IGG/IGA - Sed Rate (ESR) - Rheumatoid Factor  7. Iron  deficiency anemia, unspecified iron deficiency anemia type - CBC w/Diff/Platelet - Fe+TIBC+Fer  8. Muscle weakness (generalized) - Comprehensive metabolic panel with GFR  9. Prediabetes - Hgb A1C w/o eAG     General Counseling: Joanne Maddox understanding of the findings of todays visit and agrees with plan  of treatment. I have discussed any further diagnostic evaluation that may be needed or ordered today. We also reviewed her medications today. she has been encouraged to call the office with any questions or concerns that should arise related to todays visit.    Counseling:    Orders Placed This Encounter  Procedures   CBC w/Diff/Platelet   Comprehensive metabolic panel with GFR   TSH + free T4   Lipid Panel With LDL/HDL Ratio   Fe+TIBC+Fer   VITAMIN D  25 Hydroxy (Vit-D Deficiency, Fractures)   B12 and Folate Panel   ANA w/Reflex if Positive   CYCLIC CITRUL PEPTIDE ANTIBODY, IGG/IGA   Sed Rate (ESR)   Hgb A1C w/o eAG   Rheumatoid Factor   PSG SLEEP STUDY    No orders of the defined types were placed in this encounter.   Time spent:30 Minutes

## 2024-01-26 ENCOUNTER — Telehealth: Payer: Self-pay | Admitting: Physician Assistant

## 2024-01-26 NOTE — Telephone Encounter (Signed)
 Awaiting 01/23/24 office notes for SS order-Toni

## 2024-02-05 ENCOUNTER — Telehealth: Payer: Self-pay | Admitting: Physician Assistant

## 2024-02-05 NOTE — Telephone Encounter (Signed)
 SS order emailed to [facility] w/  [employer]-[facility]oni

## 2024-02-09 DIAGNOSIS — M6281 Muscle weakness (generalized): Secondary | ICD-10-CM | POA: Diagnosis not present

## 2024-02-09 DIAGNOSIS — R7989 Other specified abnormal findings of blood chemistry: Secondary | ICD-10-CM | POA: Diagnosis not present

## 2024-02-09 DIAGNOSIS — M2550 Pain in unspecified joint: Secondary | ICD-10-CM | POA: Diagnosis not present

## 2024-02-09 DIAGNOSIS — E559 Vitamin D deficiency, unspecified: Secondary | ICD-10-CM | POA: Diagnosis not present

## 2024-02-09 DIAGNOSIS — R7303 Prediabetes: Secondary | ICD-10-CM | POA: Diagnosis not present

## 2024-02-09 DIAGNOSIS — E538 Deficiency of other specified B group vitamins: Secondary | ICD-10-CM | POA: Diagnosis not present

## 2024-02-09 DIAGNOSIS — D509 Iron deficiency anemia, unspecified: Secondary | ICD-10-CM | POA: Diagnosis not present

## 2024-02-09 DIAGNOSIS — E782 Mixed hyperlipidemia: Secondary | ICD-10-CM | POA: Diagnosis not present

## 2024-02-11 LAB — CBC WITH DIFFERENTIAL/PLATELET
Basophils Absolute: 0 10*3/uL (ref 0.0–0.2)
Basos: 1 %
EOS (ABSOLUTE): 0 10*3/uL (ref 0.0–0.4)
Eos: 1 %
Hematocrit: 38 % (ref 34.0–46.6)
Hemoglobin: 12.5 g/dL (ref 11.1–15.9)
Immature Grans (Abs): 0 10*3/uL (ref 0.0–0.1)
Immature Granulocytes: 0 %
Lymphocytes Absolute: 1.8 10*3/uL (ref 0.7–3.1)
Lymphs: 22 %
MCH: 28.9 pg (ref 26.6–33.0)
MCHC: 32.9 g/dL (ref 31.5–35.7)
MCV: 88 fL (ref 79–97)
Monocytes Absolute: 0.4 10*3/uL (ref 0.1–0.9)
Monocytes: 5 %
Neutrophils Absolute: 5.9 10*3/uL (ref 1.4–7.0)
Neutrophils: 71 %
Platelets: 360 10*3/uL (ref 150–450)
RBC: 4.33 x10E6/uL (ref 3.77–5.28)
RDW: 13.9 % (ref 11.7–15.4)
WBC: 8.2 10*3/uL (ref 3.4–10.8)

## 2024-02-11 LAB — RHEUMATOID FACTOR: Rheumatoid fact SerPl-aCnc: <10 [IU]/mL (ref <14.0)

## 2024-02-11 LAB — LIPID PANEL WITH LDL/HDL RATIO
Cholesterol, Total: 268 mg/dL — ABNORMAL HIGH (ref 100–199)
HDL: 45 mg/dL (ref >39)
LDL Chol Calc (NIH): 201 mg/dL — ABNORMAL HIGH (ref 0–99)
LDL/HDL Ratio: 4.5 ratio — ABNORMAL HIGH (ref 0.0–3.2)
Triglycerides: 121 mg/dL (ref 0–149)
VLDL Cholesterol Cal: 22 mg/dL (ref 5–40)

## 2024-02-11 LAB — COMPREHENSIVE METABOLIC PANEL WITH GFR
ALT: 18 IU/L (ref 0–32)
AST: 19 IU/L (ref 0–40)
Albumin: 4.3 g/dL (ref 3.8–4.9)
Alkaline Phosphatase: 143 IU/L — ABNORMAL HIGH (ref 44–121)
BUN/Creatinine Ratio: 18 (ref 9–23)
BUN: 13 mg/dL (ref 6–24)
Bilirubin Total: 0.2 mg/dL (ref 0.0–1.2)
CO2: 25 mmol/L (ref 20–29)
Calcium: 9.6 mg/dL (ref 8.7–10.2)
Chloride: 99 mmol/L (ref 96–106)
Creatinine, Ser: 0.74 mg/dL (ref 0.57–1.00)
Globulin, Total: 2.7 g/dL (ref 1.5–4.5)
Glucose: 113 mg/dL — ABNORMAL HIGH (ref 70–99)
Potassium: 4.3 mmol/L (ref 3.5–5.2)
Sodium: 139 mmol/L (ref 134–144)
Total Protein: 7 g/dL (ref 6.0–8.5)
eGFR: 97 mL/min/{1.73_m2} (ref >59)

## 2024-02-11 LAB — TSH+FREE T4
Free T4: 0.86 ng/dL (ref 0.82–1.77)
TSH: 0.82 u[IU]/mL (ref 0.450–4.500)

## 2024-02-11 LAB — IRON,TIBC AND FERRITIN PANEL
Ferritin: 110 ng/mL (ref 15–150)
Iron Saturation: 17 % (ref 15–55)
Iron: 61 ug/dL (ref 27–159)
Total Iron Binding Capacity: 362 ug/dL (ref 250–450)
UIBC: 301 ug/dL (ref 131–425)

## 2024-02-11 LAB — ANA W/REFLEX IF POSITIVE
Anti JO-1: <0.2 AI (ref 0.0–0.9)
Anti Nuclear Antibody (ANA): POSITIVE — AB
Centromere Ab Screen: <0.2 AI (ref 0.0–0.9)
Chromatin Ab SerPl-aCnc: <0.2 AI (ref 0.0–0.9)
ENA RNP Ab: <0.2 AI (ref 0.0–0.9)
ENA SM Ab Ser-aCnc: <0.2 AI (ref 0.0–0.9)
ENA SSA (RO) Ab: <0.2 AI (ref 0.0–0.9)
ENA SSB (LA) Ab: <0.2 AI (ref 0.0–0.9)
Scleroderma (Scl-70) (ENA) Antibody, IgG: 5.3 AI — ABNORMAL HIGH (ref 0.0–0.9)
dsDNA Ab: <1 [IU]/mL (ref 0–9)

## 2024-02-11 LAB — HGB A1C W/O EAG: Hgb A1c MFr Bld: 6.3 % — ABNORMAL HIGH (ref 4.8–5.6)

## 2024-02-11 LAB — B12 AND FOLATE PANEL
Folate: 11.7 ng/mL (ref >3.0)
Vitamin B-12: 633 pg/mL (ref 232–1245)

## 2024-02-11 LAB — SEDIMENTATION RATE: Sed Rate: 22 mm/h (ref 0–40)

## 2024-02-11 LAB — CYCLIC CITRUL PEPTIDE ANTIBODY, IGG/IGA: Cyclic Citrullin Peptide Ab: 10 U (ref 0–19)

## 2024-02-11 LAB — VITAMIN D 25 HYDROXY (VIT D DEFICIENCY, FRACTURES): Vit D, 25-Hydroxy: 38 ng/mL (ref 30.0–100.0)

## 2024-02-19 ENCOUNTER — Encounter: Payer: Self-pay | Admitting: Physician Assistant

## 2024-02-19 ENCOUNTER — Ambulatory Visit (INDEPENDENT_AMBULATORY_CARE_PROVIDER_SITE_OTHER): Payer: BC Managed Care – PPO | Admitting: Physician Assistant

## 2024-02-19 VITALS — BP 118/78 | HR 83 | Temp 97.6°F | Resp 16 | Ht <= 58 in | Wt 180.4 lb

## 2024-02-19 DIAGNOSIS — Z0001 Encounter for general adult medical examination with abnormal findings: Secondary | ICD-10-CM

## 2024-02-19 DIAGNOSIS — F411 Generalized anxiety disorder: Secondary | ICD-10-CM | POA: Diagnosis not present

## 2024-02-19 DIAGNOSIS — R768 Other specified abnormal immunological findings in serum: Secondary | ICD-10-CM

## 2024-02-19 DIAGNOSIS — E782 Mixed hyperlipidemia: Secondary | ICD-10-CM | POA: Diagnosis not present

## 2024-02-19 DIAGNOSIS — R5383 Other fatigue: Secondary | ICD-10-CM

## 2024-02-19 DIAGNOSIS — M2550 Pain in unspecified joint: Secondary | ICD-10-CM | POA: Diagnosis not present

## 2024-02-19 DIAGNOSIS — Z23 Encounter for immunization: Secondary | ICD-10-CM

## 2024-02-19 DIAGNOSIS — R7303 Prediabetes: Secondary | ICD-10-CM

## 2024-02-19 MED ORDER — ALPRAZOLAM 0.25 MG PO TABS: 0.2500 mg | ORAL_TABLET | Freq: Every evening | ORAL | 1 refills | Status: AC | PRN

## 2024-02-19 MED ORDER — SHINGRIX 50 MCG/0.5ML IM SUSR: 0.5000 mL | Freq: Once | INTRAMUSCULAR | 0 refills | Status: AC

## 2024-02-19 NOTE — Progress Notes (Signed)
 [facility] [address] [place], [place] [address]  Internal MEDICINE  Office Visit Note  Patient Name: Joanne Maddox  [id number]  [id number]  Date of Service: 02/19/2024  Chief Complaint  Patient presents with   Annual Exam   Gastroesophageal Reflux   Hypertension   Hyperlipidemia   Quality Metric Gaps    Colonoscopy, Pneumonia and Shingles vaccines.     HPI Pt is here for routine health maintenance examination -BP stable -UTD on mammogram -shingles vaccine due and will schedule -due for repeat colonoscopy--will call GI to schedule -Has not heard about SS yet -Labs reviewed: Alk phos elevated, cholesterol very elevated and will increase lovastatin  to nightly. Also discussed possible changing to higher intensity statin but prefer to try increasing frequency first. A1c higher at 6.3, but still prediabetic. ANA + and pt symptomatic with joint pain and fatigue and will refer to rheumatology. -a little more anxious and has trouble sleeping. Trazodone  can make her too groggy in AM and only takes on weekends. Requests xanax  refill to use prn. She has not filled in a long time.  Current Medication: Outpatient Encounter Medications as of 02/19/2024  Medication Sig   Blood Glucose Monitoring Suppl (ACCU-CHEK GUIDE ME) w/Device KIT Use as directed. E11.65   celecoxib  (CELEBREX ) 200 MG capsule TAKE 1 CAPSULE BY MOUTH TWICE A DAY   cyclobenzaprine  (FLEXERIL ) 10 MG tablet Take 1 tablet (10 mg total) by mouth 3 (three) times daily as needed for muscle spasms.   fluticasone  (FLONASE ) 50 MCG/ACT nasal spray Place 2 sprays into both nostrils daily as needed for allergies.   ibuprofen  (ADVIL ) 600 MG tablet Take 1 tablet (600 mg total) by mouth every 6 (six) hours as needed for mild pain or cramping (pain).   lisinopril -hydrochlorothiazide  (ZESTORETIC ) 20-12.5 MG tablet TAKE 2 TABLETS BY MOUTH EVERY DAY   lovastatin  (MEVACOR ) 40 MG tablet TAKE 1 TABLET BY MOUTH 4 TIMES A WEEK AT  NIGHT   metFORMIN  (GLUCOPHAGE ) 500 MG tablet Take 1 tab po daily   omeprazole  (PRILOSEC) 40 MG capsule Take 1 capsule (40 mg total) by mouth daily as needed (acid reflux). (Patient taking differently: Take 40 mg by mouth daily as needed (acid reflux). Pt take OTC)   predniSONE  (DELTASONE ) 10 MG tablet Take one tab 3 x day for 3 days, then take one tab 2 x a day for 3 days and then take one tab a day for 3 days.   Semaglutide ,0.25 or 0.5MG /DOS, 2 MG/3ML SOPN Inject 0.25 mg into the skin once a week.   sertraline  (ZOLOFT ) 25 MG tablet TAKE 1 TABLET (25 MG TOTAL) BY MOUTH DAILY.   SUMAtriptan  (IMITREX ) 50 MG tablet TAKE 1 TABLET (50 MG TOTAL) BY MOUTH EVERY 2 (TWO) HOURS AS NEEDED FOR MIGRAINE. MAY REPEAT IN 2 HOURS IF HEADACHE PERSISTS OR RECURS.   traZODone  (DESYREL ) 50 MG tablet Take 1 tablet (50 mg total) by mouth at bedtime.   [DISCONTINUED] ALPRAZolam  (XANAX ) 0.25 MG tablet Take 1 tablet (0.25 mg total) by mouth at bedtime as needed for anxiety.   [DISCONTINUED] Zoster Vaccine Adjuvanted (SHINGRIX ) injection Inject 0.5 mLs into the muscle once.   ALPRAZolam  (XANAX ) 0.25 MG tablet Take 1 tablet (0.25 mg total) by mouth at bedtime as needed for anxiety.   Zoster Vaccine Adjuvanted (SHINGRIX ) injection Inject 0.5 mLs into the muscle once for 1 dose.   No facility-administered encounter medications on file as of 02/19/2024.    Surgical History: Past Surgical History:  Procedure Laterality  Date   ABDOMINAL HYSTERECTOMY     CESAREAN SECTION  09/24/1987   COLONOSCOPY  09/23/2008   Dr Felicita Horns   COLONOSCOPY WITH PROPOFOL  N/A 02/16/2021   Procedure: COLONOSCOPY WITH PROPOFOL ;  Surgeon: Irby Mannan, MD;  Location: [facility] ENDOSCOPY;  Service: Endoscopy;  Laterality: N/A;   CYSTOSCOPY N/A 11/09/2020   Procedure: CYSTOSCOPY;  Surgeon: Darl Edu, MD;  Location: [facility] ORS;  Service: Gynecology;  Laterality: N/A;   KNEE SURGERY Right 09/23/1997   TOTAL LAPAROSCOPIC HYSTERECTOMY WITH  SALPINGECTOMY Bilateral 11/09/2020   Procedure: TOTAL LAPAROSCOPIC HYSTERECTOMY WITH BILATERAL SALPINGECTOMY;  Surgeon: Darl Edu, MD;  Location: [facility] ORS;  Service: Gynecology;  Laterality: Bilateral;    Medical History: Past Medical History:  Diagnosis Date   Anxiety    Colon polyp 2010   GERD (gastroesophageal reflux disease)    Hypercholesteremia    Hyperlipidemia    Hypertension    Migraine     Family History: Family History  Problem Relation Age of Onset   Diabetes Mother    Hypertension Mother    Heart disease Mother    Hypertension Father    Heart disease Father    Breast cancer Neg Hx       Review of Systems  Constitutional:  Positive for fatigue. Negative for chills and unexpected weight change.  HENT:  Negative for congestion, rhinorrhea, sneezing and sore throat.   Eyes:  Negative for redness.  Respiratory:  Negative for cough, chest tightness and shortness of breath.   Cardiovascular:  Negative for chest pain and palpitations.  Gastrointestinal:  Negative for abdominal pain, constipation, diarrhea, nausea and vomiting.  Genitourinary:  Negative for dysuria and frequency.  Musculoskeletal:  Positive for arthralgias and myalgias. Negative for back pain, joint swelling and neck pain.  Skin:  Negative for rash.  Neurological: Negative.  Negative for tremors and numbness.  Hematological:  Negative for adenopathy. Does not bruise/bleed easily.  Psychiatric/Behavioral:  Positive for sleep disturbance. Negative for behavioral problems (Depression) and suicidal ideas. The patient is nervous/anxious.      Vital Signs: BP 118/78   Pulse 83   Temp 97.6 F (36.4 C)   Resp 16   Ht 4\' 9"  (1.448 m)   Wt 180 lb 6.4 oz (81.8 kg)   LMP 09/27/2020   SpO2 99%   BMI 39.04 kg/m    Physical Exam Vitals and nursing note reviewed.  Constitutional:      General: She is not in acute distress.    Appearance: She is well-developed. She is obese. She is not  diaphoretic.  HENT:     Head: Normocephalic and atraumatic.     Mouth/Throat:     Pharynx: No posterior oropharyngeal erythema.  Eyes:     Extraocular Movements: Extraocular movements intact.  Neck:     Thyroid : No thyromegaly.     Vascular: No JVD.     Trachea: No tracheal deviation.  Cardiovascular:     Rate and Rhythm: Normal rate and regular rhythm.     Heart sounds: Normal heart sounds. No murmur heard.    No friction rub. No gallop.  Pulmonary:     Effort: Pulmonary effort is normal. No respiratory distress.     Breath sounds: No wheezing or rales.  Chest:     Chest wall: No tenderness.  Abdominal:     General: Bowel sounds are normal.     Tenderness: There is no abdominal tenderness.  Musculoskeletal:        General: Normal range of  motion.     Right lower leg: No edema.     Left lower leg: No edema.  Skin:    General: Skin is warm and dry.  Neurological:     General: No focal deficit present.     Mental Status: She is alert.  Psychiatric:        Behavior: Behavior normal.        Thought Content: Thought content normal.        Judgment: Judgment normal.      LABS: Recent Results (from the past 2160 hours)  CBC w/Diff/Platelet     Status: None   Collection Time: 02/09/24  8:57 AM  Result Value Ref Range   WBC 8.2 3.4 - 10.8 x10E3/uL   RBC 4.33 3.77 - 5.28 x10E6/uL   Hemoglobin 12.5 11.1 - 15.9 g/dL   Hematocrit 40.9 81.1 - 46.6 %   MCV 88 79 - 97 fL   MCH 28.9 26.6 - 33.0 pg   MCHC 32.9 31.5 - 35.7 g/dL   RDW 91.4 78.2 - 95.6 %   Platelets 360 150 - 450 x10E3/uL   Neutrophils 71 Not Estab. %   Lymphs 22 Not Estab. %   Monocytes 5 Not Estab. %   Eos 1 Not Estab. %   Basos 1 Not Estab. %   Neutrophils Absolute 5.9 1.4 - 7.0 x10E3/uL   Lymphocytes Absolute 1.8 0.7 - 3.1 x10E3/uL   Monocytes Absolute 0.4 0.1 - 0.9 x10E3/uL   EOS (ABSOLUTE) 0.0 0.0 - 0.4 x10E3/uL   Basophils Absolute 0.0 0.0 - 0.2 x10E3/uL   Immature Granulocytes 0 Not Estab. %    Immature Grans (Abs) 0.0 0.0 - 0.1 x10E3/uL  Comprehensive metabolic panel with GFR     Status: Abnormal   Collection Time: 02/09/24  8:57 AM  Result Value Ref Range   Glucose 113 (H) 70 - 99 mg/dL   BUN 13 6 - 24 mg/dL   Creatinine, Ser 2.13 0.57 - 1.00 mg/dL   eGFR 97 >08 MV/HQI/6.96   BUN/Creatinine Ratio 18 9 - 23   Sodium 139 134 - 144 mmol/L   Potassium 4.3 3.5 - 5.2 mmol/L   Chloride 99 96 - 106 mmol/L   CO2 25 20 - 29 mmol/L   Calcium 9.6 8.7 - 10.2 mg/dL   Total Protein 7.0 6.0 - 8.5 g/dL   Albumin 4.3 3.8 - 4.9 g/dL   Globulin, Total 2.7 1.5 - 4.5 g/dL   Bilirubin Total 0.2 0.0 - 1.2 mg/dL   Alkaline Phosphatase 143 (H) 44 - 121 IU/L   AST 19 0 - 40 IU/L   ALT 18 0 - 32 IU/L  TSH + free T4     Status: None   Collection Time: 02/09/24  8:57 AM  Result Value Ref Range   TSH 0.820 0.450 - 4.500 uIU/mL   Free T4 0.86 0.82 - 1.77 ng/dL  Lipid Panel With LDL/HDL Ratio     Status: Abnormal   Collection Time: 02/09/24  8:57 AM  Result Value Ref Range   Cholesterol, Total 268 (H) 100 - 199 mg/dL   Triglycerides 295 0 - 149 mg/dL   HDL 45 >28 mg/dL   VLDL Cholesterol Cal 22 5 - 40 mg/dL   LDL Chol Calc (NIH) 413 (H) 0 - 99 mg/dL   LDL CALC COMMENT: Comment     Comment: Consider evaluating for Familial Hypercholesterolemia(FH), if clinically indicated.    LDL/HDL Ratio 4.5 (H) 0.0 - 3.2 ratio  Comment:                                     LDL/HDL Ratio                                             Men  Women                               1/2 Avg.Risk  1.0    1.5                                   Avg.Risk  3.6    3.2                                2X Avg.Risk  6.2    5.0                                3X Avg.Risk  8.0    6.1   Fe+TIBC+Fer     Status: None   Collection Time: 02/09/24  8:57 AM  Result Value Ref Range   Total Iron Binding Capacity 362 250 - 450 ug/dL   UIBC 841 324 - 401 ug/dL   Iron 61 27 - 027 ug/dL   Iron Saturation 17 15 - 55 %   Ferritin 110 15 -  150 ng/mL  VITAMIN D  25 Hydroxy (Vit-D Deficiency, Fractures)     Status: None   Collection Time: 02/09/24  8:57 AM  Result Value Ref Range   Vit D, 25-Hydroxy 38.0 30.0 - 100.0 ng/mL    Comment: Vitamin D  deficiency has been defined by the Institute of Medicine and an Endocrine Society practice guideline as a level of serum 25-OH vitamin D  less than 20 ng/mL (1,2). The Endocrine Society went on to further define vitamin D  insufficiency as a level between 21 and 29 ng/mL (2). 1. IOM (Institute of Medicine). 2010. Dietary reference    intakes for calcium and D. [place]: The    [employer]. 2. Holick MF, Binkley McGregor, Bischoff-Ferrari HA, et al.    Evaluation, treatment, and prevention of vitamin D     deficiency: an Endocrine Society clinical practice    guideline. JCEM. 2011 Jul; 96(7):1911-30.   B12 and Folate Panel     Status: None   Collection Time: 02/09/24  8:57 AM  Result Value Ref Range   Vitamin B-12 633 232 - 1,245 pg/mL   Folate 11.7 >3.0 ng/mL    Comment: A serum folate concentration of less than 3.1 ng/mL is considered to represent clinical deficiency.   ANA w/Reflex if Positive     Status: Abnormal   Collection Time: 02/09/24  8:57 AM  Result Value Ref Range   Anti Nuclear Antibody (ANA) Positive (A) Negative   dsDNA Ab <1 0 - 9 IU/mL    Comment:  Negative      <5                                    Equivocal  5 - 9                                    Positive      >9    ENA RNP Ab <0.2 0.0 - 0.9 AI   ENA SM Ab Ser-aCnc <0.2 0.0 - 0.9 AI   Scleroderma (Scl-70) (ENA) Antibody, IgG 5.3 (H) 0.0 - 0.9 AI   ENA SSA (RO) Ab <0.2 0.0 - 0.9 AI   ENA SSB (LA) Ab <0.2 0.0 - 0.9 AI   Chromatin Ab SerPl-aCnc <0.2 0.0 - 0.9 AI   Anti JO-1 <0.2 0.0 - 0.9 AI   Centromere Ab Screen <0.2 0.0 - 0.9 AI   See below: Comment     Comment: Autoantibody                       Disease  Association ------------------------------------------------------------                         Condition                  Frequency ---------------------   ------------------------   --------- Antinuclear Antibody,    SLE, mixed connective Direct (ANA-D)           tissue diseases ---------------------   ------------------------   --------- dsDNA                    SLE                        40 - 60% ---------------------   ------------------------   --------- Chromatin                Drug induced SLE                90%                          SLE                        48 - 97% ---------------------   ------------------------   --------- SSA (Ro)                 SLE                        25 - 35%                          Sjogren's Syndrome         40 - 70%                          Neonatal Lupus                 100% ---------------------   ------------------------   --------- SSB (La)                 SLE  10%                          Sjogren's Syndrome              30% ---------------------   -----------------------    --------- Sm (anti-Smith)          SLE                        15 - 30% ---------------------   -----------------------    --------- RNP                      Mixed Connective Tissue                          Disease                         95% (U1 nRNP,                SLE                        30 - 50% anti-ribonucleoprotein)  Polymyositis and/or                          Dermatomyositis                 20% ---------------------   ------------------------   --------- Scl-70 (antiDNA          Scleroderma (diffuse)      20 - 35% topoisomerase)           Crest                           13% ---------------------   ------------------------   --------- Jo-1                     Polymyositis and/or                          Dermatomyositis            20 - 40% ---------------------   ------------------------   --------- Centromere B              Scleroderma -  Crest                          variant                         80%   CYCLIC CITRUL PEPTIDE ANTIBODY, IGG/IGA     Status: None   Collection Time: 02/09/24  8:57 AM  Result Value Ref Range   Cyclic Citrullin Peptide Ab 10 0 - 19 units    Comment:                           Negative               <20                           Weak positive      20 - 39  Moderate positive  40 - 59                           Strong positive        >59   Sed Rate (ESR)     Status: None   Collection Time: 02/09/24  8:57 AM  Result Value Ref Range   Sed Rate 22 0 - 40 mm/hr  Hgb A1C w/o eAG     Status: Abnormal   Collection Time: 02/09/24  8:57 AM  Result Value Ref Range   Hgb A1c MFr Bld 6.3 (H) 4.8 - 5.6 %    Comment:          Prediabetes: 5.7 - 6.4          Diabetes: >6.4          Glycemic control for adults with diabetes: <7.0   Rheumatoid Factor     Status: None   Collection Time: 02/09/24  8:57 AM  Result Value Ref Range   Rheumatoid fact SerPl-aCnc <10.0 <14.0 IU/mL        Assessment/Plan: 1. Encounter for general adult medical examination with abnormal findings (Primary) CPE performed, UTD on mammogram, due for colonoscopy and she will call GI to schedule, labs reviewed  2. Polyarthralgia Will refer to rheumatology - Ambulatory referral to Rheumatology  3. ANA positive Will refer to rheumatology for further evaluation - Ambulatory referral to Rheumatology  4. Mixed hyperlipidemia Will need to increase statin to daily. May need higher intensity statin if not improving still, but pt prefers to inc frequency before making med change and will also work on diet and exercise  5. Generalized anxiety disorder May use 1/2-1 tab xanax  prn - ALPRAZolam  (XANAX ) 0.25 MG tablet; Take 1 tablet (0.25 mg total) by mouth at bedtime as needed for anxiety.  Dispense: 30 tablet; Refill: 1  6. Prediabetes Continue metformin  and work on diet and exercise  7. Need  for shingles vaccine - Zoster Vaccine Adjuvanted (SHINGRIX ) injection; Inject 0.5 mLs into the muscle once for 1 dose.  Dispense: 0.5 mL; Refill: 0  8. Other fatigue - Ambulatory referral to Rheumatology   General Counseling: elya tarquinio understanding of the findings of todays visit and agrees with plan of treatment. I have discussed any further diagnostic evaluation that may be needed or ordered today. We also reviewed her medications today. she has been encouraged to call the office with any questions or concerns that should arise related to todays visit.    Counseling:    Orders Placed This Encounter  Procedures   Ambulatory referral to Rheumatology    Meds ordered this encounter  Medications   Zoster Vaccine Adjuvanted (SHINGRIX ) injection    Sig: Inject 0.5 mLs into the muscle once for 1 dose.    Dispense:  0.5 mL    Refill:  0   ALPRAZolam  (XANAX ) 0.25 MG tablet    Sig: Take 1 tablet (0.25 mg total) by mouth at bedtime as needed for anxiety.    Dispense:  30 tablet    Refill:  1    This patient was seen by Taylor Favia, PA-C in collaboration with Dr. Verneta Gone as a part of collaborative care agreement.  Total time spent:35 Minutes  Time spent includes review of chart, medications, test results, and follow up plan with the patient.     Lawton Price, MD  Internal Medicine

## 2024-02-20 ENCOUNTER — Telehealth: Payer: Self-pay | Admitting: Physician Assistant

## 2024-02-20 NOTE — Telephone Encounter (Signed)
 Rheumatology referral faxed to [facility] per patient request; [phone]. Notified patient. Gave pt telephone [phone]

## 2024-02-27 ENCOUNTER — Telehealth: Payer: Self-pay | Admitting: Physician Assistant

## 2024-02-27 NOTE — Telephone Encounter (Addendum)
 SS appointment 04/01/24 @ [facility]. Called patient to schedule follow up with Lauren. She stated she was going to call [facility] back to r/s to later date since she will owe over $200 for test, and possibly have payments for cpap machine-Toni

## 2024-03-19 ENCOUNTER — Other Ambulatory Visit: Payer: Self-pay | Admitting: Physician Assistant

## 2024-03-19 DIAGNOSIS — R7301 Impaired fasting glucose: Secondary | ICD-10-CM

## 2024-05-03 ENCOUNTER — Encounter: Payer: Self-pay | Admitting: Obstetrics

## 2024-05-03 ENCOUNTER — Ambulatory Visit: Admitting: Obstetrics

## 2024-05-03 VITALS — BP 106/69 | HR 83 | Ht <= 58 in | Wt 177.0 lb

## 2024-05-03 DIAGNOSIS — N949 Unspecified condition associated with female genital organs and menstrual cycle: Secondary | ICD-10-CM | POA: Diagnosis not present

## 2024-05-03 DIAGNOSIS — R102 Pelvic and perineal pain: Secondary | ICD-10-CM

## 2024-05-03 DIAGNOSIS — M5431 Sciatica, right side: Secondary | ICD-10-CM | POA: Diagnosis not present

## 2024-05-03 LAB — POCT URINALYSIS DIPSTICK
Bilirubin, UA: NEGATIVE
Blood, UA: NEGATIVE
Glucose, UA: NEGATIVE
Ketones, UA: NEGATIVE
Leukocytes, UA: NEGATIVE
Nitrite, UA: NEGATIVE
Protein, UA: NEGATIVE
Spec Grav, UA: 1.015 (ref 1.010–1.025)
Urobilinogen, UA: 0.2 U/dL
pH, UA: 5 (ref 5.0–8.0)

## 2024-05-03 NOTE — Progress Notes (Signed)
   GYN ENCOUNTER  Subjective  HPI: Joanne Maddox is a 53 y.o. G1P1001 who presents today for pelvic pain. The pain has been going on for about a month. It is hard for her to describe the specific location, but it seems to be in her symphysis pubis. She feels like it is in the bone and feels like a toothache. It has been happening almost daily. It typically starts in the morning and resolves around noon. It sometimes flares back up later. She denies urinary symptoms, vaginal discharge, and prolapse symptoms. She had a total hysterectomy in 2022. 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  Past Medical History:  Diagnosis Date   Anxiety    Colon polyp 2010   GERD (gastroesophageal reflux disease)    Hypercholesteremia    Hyperlipidemia    Hypertension    Migraine    Past Surgical History:  Procedure Laterality Date   ABDOMINAL HYSTERECTOMY     CESAREAN SECTION  09/24/1987   COLONOSCOPY  09/23/2008   Dr Viktoria   COLONOSCOPY WITH PROPOFOL  N/A 02/16/2021   Procedure: COLONOSCOPY WITH PROPOFOL ;  Surgeon: Janalyn Keene NOVAK, MD;  Location: [facility];  Service: Endoscopy;  Laterality: N/A;   CYSTOSCOPY N/A 11/09/2020   Procedure: CYSTOSCOPY;  Surgeon: Lake Read, MD;  Location: [facility];  Service: Gynecology;  Laterality: N/A;   KNEE SURGERY Right 09/23/1997   TOTAL LAPAROSCOPIC HYSTERECTOMY WITH SALPINGECTOMY Bilateral 11/09/2020   Procedure: TOTAL LAPAROSCOPIC HYSTERECTOMY WITH BILATERAL SALPINGECTOMY;  Surgeon: Lake Read, MD;  Location: [facility];  Service: Gynecology;  Laterality: Bilateral;   OB History     Gravida  1   Para  1   Term  1   Preterm      AB      Living  1      SAB      IAB      Ectopic      Multiple      Live Births  1        Obstetric Comments  1st Menstrual Cycle:    1st Pregnancy:  17        Allergies  Allergen Reactions   Bactrim  [Sulfamethoxazole-Trimethoprim] Hives    Constitutional: Denied constitutional symptoms, night sweats, recent illness, fatigue, fever, insomnia and weight loss.  Genitourinary: See HPI   Musculoskeletal: +sciatic pain on right side    Objective  BP 106/69   Pulse 83   Ht 4' 9 (1.448 m)   Wt 177 lb (80.3 kg)   LMP 09/27/2020   BMI 38.30 kg/m   Physical examination   Pelvic: No suprapubic tenderness. Unable to elicit the pain described with palpation  Vulva: Normal appearance.  No lesions.No pain with palpation  Vagina: No lesions or abnormalities noted.  Support: Normal pelvic support.  Urethra No masses tenderness or scarring.  Meatus Normal size without lesions or prolapse.  Perineum: Normal exam.  No lesions.    Assessment -Pubic symphysis pain - does not appear to be gynecologic or UTI related -Persistent sciatica  Plan -Encouraged mild analgesics, rest, warm compresses -Referral sent to chiropractor   Eleanor Canny, CNM

## 2024-05-05 LAB — URINE CULTURE

## 2024-05-06 ENCOUNTER — Ambulatory Visit: Payer: Self-pay | Admitting: Obstetrics

## 2024-06-02 ENCOUNTER — Other Ambulatory Visit: Payer: Self-pay | Admitting: Physician Assistant

## 2024-06-02 DIAGNOSIS — E782 Mixed hyperlipidemia: Secondary | ICD-10-CM

## 2024-06-02 DIAGNOSIS — F411 Generalized anxiety disorder: Secondary | ICD-10-CM

## 2024-08-01 ENCOUNTER — Other Ambulatory Visit: Payer: Self-pay | Admitting: Physician Assistant

## 2024-08-01 DIAGNOSIS — I10 Essential (primary) hypertension: Secondary | ICD-10-CM

## 2024-08-05 DIAGNOSIS — E119 Type 2 diabetes mellitus without complications: Secondary | ICD-10-CM | POA: Diagnosis not present

## 2024-08-05 DIAGNOSIS — M5442 Lumbago with sciatica, left side: Secondary | ICD-10-CM | POA: Diagnosis not present

## 2024-08-05 DIAGNOSIS — G629 Polyneuropathy, unspecified: Secondary | ICD-10-CM | POA: Diagnosis not present

## 2024-08-11 ENCOUNTER — Other Ambulatory Visit: Payer: Self-pay | Admitting: Internal Medicine

## 2024-08-11 DIAGNOSIS — Z1231 Encounter for screening mammogram for malignant neoplasm of breast: Secondary | ICD-10-CM

## 2024-08-18 DIAGNOSIS — M5451 Vertebrogenic low back pain: Secondary | ICD-10-CM | POA: Diagnosis not present

## 2024-08-18 DIAGNOSIS — M5416 Radiculopathy, lumbar region: Secondary | ICD-10-CM | POA: Diagnosis not present

## 2024-08-24 DIAGNOSIS — M5451 Vertebrogenic low back pain: Secondary | ICD-10-CM | POA: Diagnosis not present

## 2024-08-24 DIAGNOSIS — M5416 Radiculopathy, lumbar region: Secondary | ICD-10-CM | POA: Diagnosis not present

## 2024-09-09 DIAGNOSIS — M79642 Pain in left hand: Secondary | ICD-10-CM | POA: Diagnosis not present

## 2024-09-09 DIAGNOSIS — M25441 Effusion, right hand: Secondary | ICD-10-CM | POA: Diagnosis not present

## 2024-09-09 DIAGNOSIS — G8929 Other chronic pain: Secondary | ICD-10-CM | POA: Diagnosis not present

## 2024-09-09 DIAGNOSIS — M79641 Pain in right hand: Secondary | ICD-10-CM | POA: Diagnosis not present

## 2024-09-09 DIAGNOSIS — M47816 Spondylosis without myelopathy or radiculopathy, lumbar region: Secondary | ICD-10-CM | POA: Diagnosis not present

## 2024-09-09 DIAGNOSIS — M7989 Other specified soft tissue disorders: Secondary | ICD-10-CM | POA: Diagnosis not present

## 2024-09-09 DIAGNOSIS — M545 Low back pain, unspecified: Secondary | ICD-10-CM | POA: Diagnosis not present

## 2024-09-14 ENCOUNTER — Other Ambulatory Visit: Payer: Self-pay | Admitting: Physician Assistant

## 2024-09-14 DIAGNOSIS — R7301 Impaired fasting glucose: Secondary | ICD-10-CM

## 2024-09-17 ENCOUNTER — Ambulatory Visit
Admission: RE | Admit: 2024-09-17 | Discharge: 2024-09-17 | Disposition: A | Discharge status: Home / Self Care | Source: Ambulatory Visit | Attending: Internal Medicine | Admitting: Internal Medicine

## 2024-09-17 DIAGNOSIS — Z1231 Encounter for screening mammogram for malignant neoplasm of breast: Secondary | ICD-10-CM | POA: Insufficient documentation

## 2024-10-11 ENCOUNTER — Encounter: Payer: Self-pay | Admitting: Physician Assistant

## 2024-10-21 ENCOUNTER — Other Ambulatory Visit: Payer: Self-pay | Admitting: Physician Assistant

## 2024-10-21 DIAGNOSIS — E782 Mixed hyperlipidemia: Secondary | ICD-10-CM

## 2024-10-23 ENCOUNTER — Other Ambulatory Visit: Payer: Self-pay | Admitting: Physician Assistant

## 2024-10-23 DIAGNOSIS — F411 Generalized anxiety disorder: Secondary | ICD-10-CM

## 2025-02-21 ENCOUNTER — Encounter: Admitting: Physician Assistant
